# Patient Record
Sex: Female | Born: 1980 | Race: Black or African American | Hispanic: No | Marital: Single | Smoking: Never smoker
Health system: Southern US, Community
[De-identification: ages and names within clinical notes are randomized; demographics above are authoritative.]

## PROBLEM LIST (undated history)

## (undated) DIAGNOSIS — I10 Essential (primary) hypertension: Secondary | ICD-10-CM

## (undated) DIAGNOSIS — I2510 Atherosclerotic heart disease of native coronary artery without angina pectoris: Secondary | ICD-10-CM

## (undated) HISTORY — PX: TUBAL LIGATION: SHX77

## (undated) HISTORY — PX: CORONARY ANGIOPLASTY WITH STENT PLACEMENT: SHX49

---

## 2011-09-12 ENCOUNTER — Emergency Department (HOSPITAL_BASED_OUTPATIENT_CLINIC_OR_DEPARTMENT_OTHER): Payer: Self-pay

## 2011-09-12 ENCOUNTER — Encounter (HOSPITAL_BASED_OUTPATIENT_CLINIC_OR_DEPARTMENT_OTHER): Payer: Self-pay | Admitting: *Deleted

## 2011-09-12 ENCOUNTER — Emergency Department (HOSPITAL_BASED_OUTPATIENT_CLINIC_OR_DEPARTMENT_OTHER)
Admission: EM | Admit: 2011-09-12 | Discharge: 2011-09-12 | Disposition: A | Discharge status: Home / Self Care | Payer: Self-pay | Attending: Emergency Medicine | Admitting: Emergency Medicine

## 2011-09-12 DIAGNOSIS — R102 Pelvic and perineal pain: Secondary | ICD-10-CM

## 2011-09-12 DIAGNOSIS — R10819 Abdominal tenderness, unspecified site: Secondary | ICD-10-CM | POA: Insufficient documentation

## 2011-09-12 DIAGNOSIS — R1031 Right lower quadrant pain: Secondary | ICD-10-CM | POA: Insufficient documentation

## 2011-09-12 DIAGNOSIS — N949 Unspecified condition associated with female genital organs and menstrual cycle: Secondary | ICD-10-CM | POA: Insufficient documentation

## 2011-09-12 DIAGNOSIS — R300 Dysuria: Secondary | ICD-10-CM | POA: Insufficient documentation

## 2011-09-12 DIAGNOSIS — I10 Essential (primary) hypertension: Secondary | ICD-10-CM | POA: Insufficient documentation

## 2011-09-12 HISTORY — DX: Essential (primary) hypertension: I10

## 2011-09-12 LAB — CBC
HCT: 40 % (ref 36.0–46.0)
Hemoglobin: 13.7 g/dL (ref 12.0–15.0)
MCH: 29.1 pg (ref 26.0–34.0)
MCHC: 34.3 g/dL (ref 30.0–36.0)
MCV: 85.1 fL (ref 78.0–100.0)
Platelets: 264 10*3/uL (ref 150–400)
RBC: 4.7 MIL/uL (ref 3.87–5.11)
RDW: 13.1 % (ref 11.5–15.5)
WBC: 7.5 10*3/uL (ref 4.0–10.5)

## 2011-09-12 LAB — WET PREP, GENITAL
Trich, Wet Prep: NONE SEEN
Yeast Wet Prep HPF POC: NONE SEEN

## 2011-09-12 LAB — URINALYSIS, ROUTINE W REFLEX MICROSCOPIC
Bilirubin Urine: NEGATIVE
Glucose, UA: NEGATIVE mg/dL
Ketones, ur: NEGATIVE mg/dL
Leukocytes, UA: NEGATIVE
Nitrite: NEGATIVE
Protein, ur: 30 mg/dL — AB
Specific Gravity, Urine: 1.024 (ref 1.005–1.030)
Urobilinogen, UA: 1 mg/dL (ref 0.0–1.0)
pH: 7 (ref 5.0–8.0)

## 2011-09-12 LAB — BASIC METABOLIC PANEL
BUN: 9 mg/dL (ref 6–23)
CO2: 26 mEq/L (ref 19–32)
Calcium: 9.4 mg/dL (ref 8.4–10.5)
Chloride: 107 mEq/L (ref 96–112)
Creatinine, Ser: 1.4 mg/dL — ABNORMAL HIGH (ref 0.50–1.10)
GFR calc Af Amer: 56 mL/min — ABNORMAL LOW (ref >90)
GFR calc non Af Amer: 49 mL/min — ABNORMAL LOW (ref >90)
Glucose, Bld: 104 mg/dL — ABNORMAL HIGH (ref 70–99)
Potassium: 4.4 mEq/L (ref 3.5–5.1)
Sodium: 142 mEq/L (ref 135–145)

## 2011-09-12 LAB — DIFFERENTIAL
Basophils Absolute: 0 10*3/uL (ref 0.0–0.1)
Basophils Relative: 0 % (ref 0–1)
Eosinophils Absolute: 0.1 10*3/uL (ref 0.0–0.7)
Eosinophils Relative: 1 % (ref 0–5)
Lymphocytes Relative: 43 % (ref 12–46)
Lymphs Abs: 3.3 10*3/uL (ref 0.7–4.0)
Monocytes Absolute: 0.7 10*3/uL (ref 0.1–1.0)
Monocytes Relative: 10 % (ref 3–12)
Neutro Abs: 3.5 10*3/uL (ref 1.7–7.7)
Neutrophils Relative %: 46 % (ref 43–77)

## 2011-09-12 LAB — URINE MICROSCOPIC-ADD ON

## 2011-09-12 LAB — PREGNANCY, URINE: Preg Test, Ur: NEGATIVE

## 2011-09-12 MED ORDER — IBUPROFEN 800 MG PO TABS
800.0000 mg | ORAL_TABLET | Freq: Once | ORAL | Status: AC
  Administered 2011-09-12: 800 mg via ORAL
  Filled 2011-09-12: qty 1

## 2011-09-12 MED ORDER — PHENAZOPYRIDINE HCL 200 MG PO TABS: 200.0000 mg | ORAL_TABLET | Freq: Three times a day (TID) | ORAL | Status: AC

## 2011-09-12 MED ORDER — ONDANSETRON 8 MG PO TBDP: 8.0000 mg | ORAL_TABLET | Freq: Three times a day (TID) | ORAL | Status: AC | PRN

## 2011-09-12 MED ORDER — OXYCODONE-ACETAMINOPHEN 5-325 MG PO TABS
1.0000 | ORAL_TABLET | Freq: Once | ORAL | Status: AC
  Administered 2011-09-12: 1 via ORAL
  Filled 2011-09-12 (×2): qty 1

## 2011-09-12 MED ORDER — SODIUM CHLORIDE 0.9 % IV SOLN: Freq: Once | INTRAVENOUS | Status: DC

## 2011-09-12 MED ORDER — IBUPROFEN 800 MG PO TABS: 800.0000 mg | ORAL_TABLET | Freq: Three times a day (TID) | ORAL | Status: AC | PRN

## 2011-09-12 MED ORDER — SULFAMETHOXAZOLE-TRIMETHOPRIM 800-160 MG PO TABS: 1.0000 | ORAL_TABLET | Freq: Two times a day (BID) | ORAL | Status: AC

## 2011-09-12 MED ORDER — ONDANSETRON 8 MG PO TBDP
8.0000 mg | ORAL_TABLET | Freq: Once | ORAL | Status: AC
Start: 2011-09-12 — End: 2011-09-12
  Administered 2011-09-12: 8 mg via ORAL
  Filled 2011-09-12: qty 1

## 2011-09-12 MED ORDER — DIPHENHYDRAMINE HCL 50 MG/ML IJ SOLN: 12.5000 mg | Freq: Once | INTRAMUSCULAR | Status: DC

## 2011-09-12 MED ORDER — PROCHLORPERAZINE EDISYLATE 5 MG/ML IJ SOLN
10.0000 mg | Freq: Four times a day (QID) | INTRAMUSCULAR | Status: DC | PRN
  Filled 2011-09-12: qty 2

## 2011-09-12 MED ORDER — HYDROCODONE-ACETAMINOPHEN 5-325 MG PO TABS: 1.0000 | ORAL_TABLET | ORAL | Status: AC | PRN

## 2011-09-12 NOTE — ED Provider Notes (Signed)
History     CSN: [id number]  Arrival date & time 09/12/11  1718   First MD Initiated Contact with Patient 09/12/11 1750      Chief Complaint  Patient presents with  . Urinary Tract Infection    (Consider location/radiation/quality/duration/timing/severity/associated sxs/prior treatment) HPI Comments: Patient presents today with a past medical history of UTIs with the chief complaint of hematuria and abdominal pain. She states the abdominal pain began yesterday along with dysuria and urinary frequency. This morning she noticed blood in her urine. She rates the pain 8/10 and has taken Tylenol which has not provided relief. She denies fever, nausea, shortness of breath, chest pain, diaphoresis and vaginal discharge. She vomited once last night. She did not take her Norvasc this morning and has a history of hypertension. She has no change in appetite. She has a medical history of chlamydia and trichomonas and was treated for both without any complications.  Patient is a 31 y.o. female presenting with urinary tract infection. The history is provided by the patient.  Urinary Tract Infection This is a new problem. The current episode started yesterday. The problem occurs constantly. The problem has been unchanged. Associated symptoms include abdominal pain, urinary symptoms and vomiting. Pertinent negatives include no change in bowel habit, chest pain, chills, fever or nausea. She has tried acetaminophen for the symptoms. The treatment provided no relief.    Past Medical History  Diagnosis Date  . Hypertension     Past Surgical History  Procedure Date  . Cesarean section   . Tubal ligation     No family history on file.  History  Substance Use Topics  . Smoking status: Never Smoker   . Smokeless tobacco: Not on file  . Alcohol Use: No    OB History    Grav Para Term Preterm Abortions TAB SAB Ect Mult Living                  Review of Systems  Constitutional: Negative for  fever, chills and appetite change.  Respiratory: Negative for shortness of breath.   Cardiovascular: Negative for chest pain.  Gastrointestinal: Positive for vomiting and abdominal pain. Negative for nausea, diarrhea and change in bowel habit.  Genitourinary: Positive for dysuria, frequency and hematuria. Negative for flank pain, vaginal bleeding, vaginal discharge, difficulty urinating, vaginal pain and menstrual problem.       Suprapubic pain    Allergies  Review of patient's allergies indicates no known allergies.  Home Medications   Current Outpatient Rx  Name Route Sig Dispense Refill  . ACETAMINOPHEN 500 MG PO TABS Oral Take 1,000 mg by mouth 2 (two) times daily as needed. For pain    . AMLODIPINE BESYLATE 10 MG PO TABS Oral Take 10 mg by mouth daily.      BP 159/110  Pulse 96  Temp 98.4 F (36.9 C) (Oral)  Resp 20  SpO2 100%  Physical Exam  Nursing note and vitals reviewed. Constitutional: She appears well-developed and well-nourished.       In no acute distress  Cardiovascular: Normal rate, regular rhythm and normal heart sounds.   Pulmonary/Chest: Effort normal and breath sounds normal.  Abdominal: Soft. Bowel sounds are normal. There is tenderness.       Suprapubic tenderness to palpation. No CVA tenderness with percussion  Genitourinary: No vaginal discharge found.       GU exam: Cervix is pink with blood. No cervical or vaginal discharge. Generalized tenderness and pain with speculum and pelvic  exam    ED Course  Procedures (including critical care time)   Labs Reviewed  URINALYSIS, ROUTINE W REFLEX MICROSCOPIC   No results found. Results for orders placed during the hospital encounter of 09/12/11  URINALYSIS, ROUTINE W REFLEX MICROSCOPIC      Component Value Range   Color, Urine AMBER (*) YELLOW   APPearance CLOUDY (*) CLEAR   Specific Gravity, Urine 1.024  1.005 - 1.030   pH 7.0  5.0 - 8.0   Glucose, UA NEGATIVE  NEGATIVE mg/dL   Hgb urine dipstick  LARGE (*) NEGATIVE   Bilirubin Urine NEGATIVE  NEGATIVE   Ketones, ur NEGATIVE  NEGATIVE mg/dL   Protein, ur 30 (*) NEGATIVE mg/dL   Urobilinogen, UA 1.0  0.0 - 1.0 mg/dL   Nitrite NEGATIVE  NEGATIVE   Leukocytes, UA NEGATIVE  NEGATIVE  URINE MICROSCOPIC-ADD ON      Component Value Range   Squamous Epithelial / LPF FEW (*) RARE   RBC / HPF 7-10  <3 RBC/hpf   Bacteria, UA RARE  RARE  BASIC METABOLIC PANEL      Component Value Range   Sodium 142  135 - 145 mEq/L   Potassium 4.4  3.5 - 5.1 mEq/L   Chloride 107  96 - 112 mEq/L   CO2 26  19 - 32 mEq/L   Glucose, Bld 104 (*) 70 - 99 mg/dL   BUN 9  6 - 23 mg/dL   Creatinine, Ser 1.61 (*) 0.50 - 1.10 mg/dL   Calcium 9.4  8.4 - 09.6 mg/dL   GFR calc non Af Amer 49 (*) >90 mL/min   GFR calc Af Amer 56 (*) >90 mL/min  PREGNANCY, URINE      Component Value Range   Preg Test, Ur NEGATIVE  NEGATIVE  CBC      Component Value Range   WBC 7.5  4.0 - 10.5 K/uL   RBC 4.70  3.87 - 5.11 MIL/uL   Hemoglobin 13.7  12.0 - 15.0 g/dL   HCT 04.5  40.9 - 81.1 %   MCV 85.1  78.0 - 100.0 fL   MCH 29.1  26.0 - 34.0 pg   MCHC 34.3  30.0 - 36.0 g/dL   RDW 91.4  78.2 - 95.6 %   Platelets 264  150 - 400 K/uL  DIFFERENTIAL      Component Value Range   Neutrophils Relative 46  43 - 77 %   Neutro Abs 3.5  1.7 - 7.7 K/uL   Lymphocytes Relative 43  12 - 46 %   Lymphs Abs 3.3  0.7 - 4.0 K/uL   Monocytes Relative 10  3 - 12 %   Monocytes Absolute 0.7  0.1 - 1.0 K/uL   Eosinophils Relative 1  0 - 5 %   Eosinophils Absolute 0.1  0.0 - 0.7 K/uL   Basophils Relative 0  0 - 1 %   Basophils Absolute 0.0  0.0 - 0.1 K/uL  WET PREP, GENITAL      Component Value Range   Yeast Wet Prep HPF POC NONE SEEN  NONE SEEN   Trich, Wet Prep NONE SEEN  NONE SEEN   Clue Cells Wet Prep HPF POC MODERATE (*) NONE SEEN   WBC, Wet Prep HPF POC RARE (*) NONE SEEN   BP 159/110  Pulse 96  Temp 98.4 F (36.9 C) (Oral)  Resp 20  SpO2 100%   No diagnosis found. 1. Pelvic  pain 2. Dysuria 3. Uterine fibroid  MDM  Ultrasound ordered to evaluate disproportionate pain on bimanual exam.   US Transvaginal Non-ob  09/12/2011  *RADIOLOGY REPORT*  Clinical Data: Right lower quadrant abdominal pain with hematuria for 2 days.  History of recurrent urinary tract infection, C section and tubal ligation.  LMP unknown.  TRANSABDOMINAL AND TRANSVAGINAL ULTRASOUND OF PELVIS Technique:  Both transabdominal and transvaginal ultrasound examinations of the pelvis were performed. Transabdominal technique was performed for global imaging of the pelvis including uterus, ovaries, adnexal regions, and pelvic cul-de-sac.  It was necessary to proceed with endovaginal exam following the transabdominal exam to visualize the endometrium and ovaries to better advantage.  Comparison:  None  Findings:  Uterus: 8.0 x 3.9 x 4.1 cm.  There is a mildly exophytic anterior fundal fibroid on the right measuring 2.3 x 1.4 x 2.2 cm.  Endometrium: Homogeneous in appearance measuring 6.5 mm in thickness.  Right ovary:  Normal in appearance measuring 2.5 x 1.9 x 2.0 cm.  Left ovary: Normal appearance measuring 2.6 x 1.8 x 1.8 cm.  Other findings: No free fluid  IMPRESSION: No acute pelvic findings.  Small fundal fibroid.   Original Report Authenticated By: Gerrianne Scale, M.D.    US Pelvis Complete  09/12/2011  *RADIOLOGY REPORT*  Clinical Data: Right lower quadrant abdominal pain with hematuria for 2 days.  History of recurrent urinary tract infection, C section and tubal ligation.  LMP unknown.  TRANSABDOMINAL AND TRANSVAGINAL ULTRASOUND OF PELVIS Technique:  Both transabdominal and transvaginal ultrasound examinations of the pelvis were performed. Transabdominal technique was performed for global imaging of the pelvis including uterus, ovaries, adnexal regions, and pelvic cul-de-sac.  It was necessary to proceed with endovaginal exam following the transabdominal exam to visualize the endometrium and ovaries to  better advantage.  Comparison:  None  Findings:  Uterus: 8.0 x 3.9 x 4.1 cm.  There is a mildly exophytic anterior fundal fibroid on the right measuring 2.3 x 1.4 x 2.2 cm.  Endometrium: Homogeneous in appearance measuring 6.5 mm in thickness.  Right ovary:  Normal in appearance measuring 2.5 x 1.9 x 2.0 cm.  Left ovary: Normal appearance measuring 2.6 x 1.8 x 1.8 cm.  Other findings: No free fluid  IMPRESSION: No acute pelvic findings.  Small fundal fibroid.   Original Report Authenticated By: Gerrianne Scale, M.D.    Pain has improved with Percocet. Ultrasound ruled out torsion, PID and abscess. Small amount of cervical bleeding may be due to fibroid. VSS. Patient being treated for urinary symptoms with antibiotic and pyridium. Patient will have close follow-up with OBGYN physician.      Rodena Medin, PA-C 09/14/11 0110

## 2011-09-12 NOTE — ED Notes (Signed)
Another CBC will be done on Room [address] due to label.

## 2011-09-12 NOTE — ED Notes (Signed)
Pt. Is in no distress with reports of pain in the lower R quadrant.  No nausea or vomiting noted.

## 2011-09-12 NOTE — ED Notes (Signed)
Hematuria since yesterday. Hx of UTI's

## 2011-09-13 LAB — GC/CHLAMYDIA PROBE AMP, GENITAL
Chlamydia, DNA Probe: NEGATIVE
GC Probe Amp, Genital: NEGATIVE

## 2011-09-14 LAB — URINE CULTURE: Colony Count: 30000

## 2011-09-14 NOTE — ED Provider Notes (Signed)
Medical screening examination/treatment/procedure(s) were performed by non-physician practitioner and as supervising physician I was immediately available for consultation/collaboration.  Jones Skene, M.D.     Jones Skene, MD 09/14/11 1242

## 2012-07-22 ENCOUNTER — Encounter (HOSPITAL_BASED_OUTPATIENT_CLINIC_OR_DEPARTMENT_OTHER): Payer: Self-pay | Admitting: *Deleted

## 2012-07-22 ENCOUNTER — Emergency Department (HOSPITAL_BASED_OUTPATIENT_CLINIC_OR_DEPARTMENT_OTHER)
Admission: EM | Admit: 2012-07-22 | Discharge: 2012-07-22 | Disposition: A | Discharge status: Home Health | Payer: Self-pay | Attending: Emergency Medicine | Admitting: Emergency Medicine

## 2012-07-22 DIAGNOSIS — I10 Essential (primary) hypertension: Secondary | ICD-10-CM | POA: Insufficient documentation

## 2012-07-22 DIAGNOSIS — R51 Headache: Secondary | ICD-10-CM | POA: Insufficient documentation

## 2012-07-22 DIAGNOSIS — R519 Headache, unspecified: Secondary | ICD-10-CM

## 2012-07-22 DIAGNOSIS — Z3202 Encounter for pregnancy test, result negative: Secondary | ICD-10-CM | POA: Insufficient documentation

## 2012-07-22 LAB — URINALYSIS, ROUTINE W REFLEX MICROSCOPIC
Bilirubin Urine: NEGATIVE
Glucose, UA: NEGATIVE mg/dL
Hgb urine dipstick: NEGATIVE
Ketones, ur: NEGATIVE mg/dL
Leukocytes, UA: NEGATIVE
Nitrite: NEGATIVE
Protein, ur: 100 mg/dL — AB
Specific Gravity, Urine: 1.011 (ref 1.005–1.030)
Urobilinogen, UA: 1 mg/dL (ref 0.0–1.0)
pH: 7.5 (ref 5.0–8.0)

## 2012-07-22 LAB — PREGNANCY, URINE: Preg Test, Ur: NEGATIVE

## 2012-07-22 LAB — URINE MICROSCOPIC-ADD ON

## 2012-07-22 MED ORDER — HYDROCHLOROTHIAZIDE 25 MG PO TABS: 25.0000 mg | ORAL_TABLET | Freq: Every day | ORAL | Status: DC

## 2012-07-22 MED ORDER — KETOROLAC TROMETHAMINE 30 MG/ML IJ SOLN
30.0000 mg | Freq: Once | INTRAMUSCULAR | Status: AC
  Administered 2012-07-22: 30 mg via INTRAVENOUS
  Filled 2012-07-22: qty 1

## 2012-07-22 MED ORDER — DIPHENHYDRAMINE HCL 50 MG/ML IJ SOLN
25.0000 mg | Freq: Once | INTRAMUSCULAR | Status: AC
  Administered 2012-07-22: 25 mg via INTRAVENOUS
  Filled 2012-07-22: qty 1

## 2012-07-22 MED ORDER — SODIUM CHLORIDE 0.9 % IV SOLN
Freq: Once | INTRAVENOUS | Status: AC
  Administered 2012-07-22: 18:00:00 via INTRAVENOUS

## 2012-07-22 MED ORDER — HYDROCHLOROTHIAZIDE 25 MG PO TABS
25.0000 mg | ORAL_TABLET | Freq: Once | ORAL | Status: AC
  Administered 2012-07-22: 25 mg via ORAL
  Filled 2012-07-22: qty 1

## 2012-07-22 MED ORDER — METOCLOPRAMIDE HCL 5 MG/ML IJ SOLN
10.0000 mg | Freq: Once | INTRAMUSCULAR | Status: AC
  Administered 2012-07-22: 10 mg via INTRAVENOUS
  Filled 2012-07-22: qty 2

## 2012-07-22 NOTE — ED Notes (Signed)
Pt reports migraine and high blood pressure. Pt states she just left [facility] states they told her blood pressure was 200/104 but she states there were too many people in front of her to stay and wait

## 2012-07-22 NOTE — ED Provider Notes (Signed)
Medical screening examination/treatment/procedure(s) were performed by non-physician practitioner and as supervising physician I was immediately available for consultation/collaboration.   Gwyneth Sprout, MD 07/22/12 2009

## 2012-07-22 NOTE — ED Provider Notes (Signed)
   History    CSN: [id number] Arrival date & time 07/22/12  1601  First MD Initiated Contact with Patient 07/22/12 1633     Chief Complaint  Patient presents with  . Migraine  . Hypertension   (Consider location/radiation/quality/duration/timing/severity/associated sxs/prior Treatment) Patient is a 32 y.o. female presenting with hypertension. The history is provided by the patient. No language interpreter was used.  Hypertension This is a new problem. The current episode started today. The problem occurs constantly. The problem has been gradually worsening. Associated symptoms include headaches. Nothing aggravates the symptoms. She has tried nothing for the symptoms. The treatment provided moderate relief.  Pt complains of high blood pressure.  Pt reports she also has a headache.   Bp was higher at [place] [facility] Past Medical History  Diagnosis Date  . Hypertension    Past Surgical History  Procedure Laterality Date  . Cesarean section    . Tubal ligation     History reviewed. No pertinent family history. History  Substance Use Topics  . Smoking status: Never Smoker   . Smokeless tobacco: Not on file  . Alcohol Use: No   OB History   Grav Para Term Preterm Abortions TAB SAB Ect Mult Living                 Review of Systems  Neurological: Positive for headaches.  All other systems reviewed and are negative.    Allergies  Review of patient's allergies indicates no known allergies.  Home Medications   Current Outpatient Rx  Name  Route  Sig  Dispense  Refill  . acetaminophen (TYLENOL) 500 MG tablet   Oral   Take 1,000 mg by mouth 2 (two) times daily as needed. For pain         . amLODipine (NORVASC) 10 MG tablet   Oral   Take 10 mg by mouth daily.          BP 170/121  Pulse 80  Temp(Src) 97.8 F (36.6 C) (Oral)  Resp 18  Ht 5\' 1"  (1.549 m)  Wt 210 lb (95.255 kg)  BMI 39.7 kg/m2  SpO2 99% Physical Exam  Nursing note and vitals  reviewed. Constitutional: She is oriented to person, place, and time. She appears well-developed and well-nourished.  HENT:  Head: Normocephalic.  Right Ear: External ear normal.  Left Ear: External ear normal.  Eyes: Pupils are equal, round, and reactive to light.  Neck: Normal range of motion.  Cardiovascular: Normal rate.   Pulmonary/Chest: Effort normal.  Abdominal: Soft.  Musculoskeletal: Normal range of motion.  Neurological: She is alert and oriented to person, place, and time. She has normal reflexes.  Skin: Skin is warm.  Psychiatric: She has a normal mood and affect.    ED Course  Procedures (including critical care time) Labs Reviewed  URINALYSIS, ROUTINE W REFLEX MICROSCOPIC  PREGNANCY, URINE   No results found. 1. Hypertension   2. Headache     MDM  Blood pressure decreased,   Headache resolved,   Pt reports she feels much better.   Rx for hctz.   See your PA for recheck this week  Elson Areas, [place] 07/22/12 4098

## 2012-08-20 ENCOUNTER — Emergency Department (HOSPITAL_COMMUNITY): Payer: Self-pay

## 2012-08-20 ENCOUNTER — Encounter (HOSPITAL_COMMUNITY): Payer: Self-pay | Admitting: Emergency Medicine

## 2012-08-20 ENCOUNTER — Emergency Department (HOSPITAL_COMMUNITY)
Admission: EM | Admit: 2012-08-20 | Discharge: 2012-08-20 | Disposition: A | Discharge status: Home / Self Care | Payer: Self-pay | Attending: Emergency Medicine | Admitting: Emergency Medicine

## 2012-08-20 DIAGNOSIS — I10 Essential (primary) hypertension: Secondary | ICD-10-CM | POA: Insufficient documentation

## 2012-08-20 DIAGNOSIS — Z79899 Other long term (current) drug therapy: Secondary | ICD-10-CM | POA: Insufficient documentation

## 2012-08-20 DIAGNOSIS — R109 Unspecified abdominal pain: Secondary | ICD-10-CM | POA: Insufficient documentation

## 2012-08-20 DIAGNOSIS — Z3202 Encounter for pregnancy test, result negative: Secondary | ICD-10-CM | POA: Insufficient documentation

## 2012-08-20 LAB — CBC WITH DIFFERENTIAL/PLATELET
Basophils Absolute: 0 10*3/uL (ref 0.0–0.1)
Basophils Relative: 0 % (ref 0–1)
Eosinophils Absolute: 0.1 10*3/uL (ref 0.0–0.7)
Eosinophils Relative: 1 % (ref 0–5)
HCT: 37 % (ref 36.0–46.0)
Hemoglobin: 13.2 g/dL (ref 12.0–15.0)
Lymphocytes Relative: 52 % — ABNORMAL HIGH (ref 12–46)
Lymphs Abs: 3.2 10*3/uL (ref 0.7–4.0)
MCH: 30.1 pg (ref 26.0–34.0)
MCHC: 35.7 g/dL (ref 30.0–36.0)
MCV: 84.5 fL (ref 78.0–100.0)
Monocytes Absolute: 0.5 10*3/uL (ref 0.1–1.0)
Monocytes Relative: 8 % (ref 3–12)
Neutro Abs: 2.4 10*3/uL (ref 1.7–7.7)
Neutrophils Relative %: 39 % — ABNORMAL LOW (ref 43–77)
Platelets: 259 10*3/uL (ref 150–400)
RBC: 4.38 MIL/uL (ref 3.87–5.11)
RDW: 13.1 % (ref 11.5–15.5)
WBC: 6.2 10*3/uL (ref 4.0–10.5)

## 2012-08-20 LAB — URINALYSIS, ROUTINE W REFLEX MICROSCOPIC
Glucose, UA: NEGATIVE mg/dL
Hgb urine dipstick: NEGATIVE
Ketones, ur: NEGATIVE mg/dL
Leukocytes, UA: NEGATIVE
Nitrite: NEGATIVE
Protein, ur: 30 mg/dL — AB
Specific Gravity, Urine: 1.03 (ref 1.005–1.030)
Urobilinogen, UA: 1 mg/dL (ref 0.0–1.0)
pH: 6 (ref 5.0–8.0)

## 2012-08-20 LAB — BASIC METABOLIC PANEL
BUN: 10 mg/dL (ref 6–23)
CO2: 22 mEq/L (ref 19–32)
Calcium: 9.1 mg/dL (ref 8.4–10.5)
Chloride: 105 mEq/L (ref 96–112)
Creatinine, Ser: 1.19 mg/dL — ABNORMAL HIGH (ref 0.50–1.10)
GFR calc Af Amer: 68 mL/min — ABNORMAL LOW (ref >90)
GFR calc non Af Amer: 59 mL/min — ABNORMAL LOW (ref >90)
Glucose, Bld: 73 mg/dL (ref 70–99)
Potassium: 4.6 mEq/L (ref 3.5–5.1)
Sodium: 138 mEq/L (ref 135–145)

## 2012-08-20 LAB — URINE MICROSCOPIC-ADD ON

## 2012-08-20 LAB — PREGNANCY, URINE: Preg Test, Ur: NEGATIVE

## 2012-08-20 MED ORDER — HYDROCODONE-ACETAMINOPHEN 5-325 MG PO TABS: 2.0000 | ORAL_TABLET | ORAL | Status: DC | PRN

## 2012-08-20 MED ORDER — IOHEXOL 300 MG/ML  SOLN
100.0000 mL | Freq: Once | INTRAMUSCULAR | Status: AC | PRN
  Administered 2012-08-20: 100 mL via INTRAVENOUS

## 2012-08-20 NOTE — ED Notes (Signed)
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 Seen at [facility] stated abdominal strain seen again today at [facility] sent to ED for further evaluation.  Pain currently 8/10 achy sharp. Denies nausea/emesis/ or diarrhea.

## 2012-08-20 NOTE — ED Provider Notes (Signed)
CSN: [id number]     Arrival date & time 08/20/12  1536 History     First MD Initiated Contact with Patient 08/20/12 1721     Chief Complaint  Patient presents with  . Abdominal Pain   (Consider location/radiation/quality/duration/timing/severity/associated sxs/prior Treatment) HPI 32 year old female presents to the emergency department chief complaint of right lower quadrant abdominal pain.  She's been seen twice [facility] for this problem and was given a diagnosis of abdominal wall strain.  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  She complains of pain in the right lower quadrant which is worse with walking twisting and movement.  She says it is severe with walking.  It is relieved by rest and lying down.  She is taken ibuprofen 800 mg without relief of her symptoms.  Pain is 8 out of 10, sharp, achy does not radiate.  The patient denies abdominal symptoms such as nausea, vomiting, diarrhea, hematochezia or melena.  Patient denies any urinary or vaginal symptoms.  She is history of cesarean section  , tubal ligation and no other abdominal surgeries. Denies fevers, chills, myalgias, arthralgias. Denies DOE, SOB, chest tightness or pressure, radiation to left arm, jaw or back, or diaphoresis. Denies headaches, light headedness, weakness, visual disturbances.  Past Medical History  Diagnosis Date  . Hypertension    Past Surgical History  Procedure Laterality Date  . Cesarean section    . Tubal ligation     No family history on file. History  Substance Use Topics  . Smoking status: Never Smoker   . Smokeless tobacco: Not on file  . Alcohol Use: No   OB History   Grav Para Term Preterm Abortions TAB SAB Ect Mult Living                 Review of Systems Ten systems reviewed and are negative for acute change, except as noted in the HPI.   Allergies  Review of patient's allergies indicates no known  allergies.  Home Medications   Current Outpatient Rx  Name  Route  Sig  Dispense  Refill  . acetaminophen (TYLENOL) 500 MG tablet   Oral   Take 500 mg by mouth 2 (two) times daily as needed for pain. For pain         . amLODipine (NORVASC) 10 MG tablet   Oral   Take 10 mg by mouth daily.         . hydrochlorothiazide (HYDRODIURIL) 25 MG tablet   Oral   Take 25 mg by mouth daily.         Marland Kitchen ibuprofen (ADVIL,MOTRIN) 800 MG tablet   Oral   Take 800 mg by mouth every 8 (eight) hours as needed for pain.          BP 146/102  Pulse 73  Temp(Src) 99.1 F (37.3 C) (Oral)  Resp 24  SpO2 99% Physical Exam Physical Exam  Nursing note and vitals reviewed. Constitutional: She is oriented to person, place, and time. She appears well-developed and well-nourished. No distress.  HENT:  Head: Normocephalic and atraumatic.  Eyes: Conjunctivae normal and EOM are normal. Pupils are equal, round, and reactive to light. No scleral icterus.  Neck: Normal range of motion.  Cardiovascular: Normal rate, regular rhythm and normal heart sounds.  Exam reveals no gallop and no friction rub.   No murmur heard. Pulmonary/Chest: Effort normal and breath sounds normal. No respiratory distress.  Abdominal: Soft. Bowel sounds are normal.  Tender to palpation of the abdominal cavity in the right lower quadrant and right upper quadrant.  No hernias or masses palpated.  Patient is able to flex the abdominal cavity activating the abdominal wall without pain.  She can flex the right hip activating the cell has without pain.  Neurological: She is alert and oriented to person, place, and time.  Skin: Skin is warm and dry. She is not diaphoretic.    ED Course   Procedures (including critical care time)   EMERGENCY DEPARTMENT BILIARY ULTRASOUND INTERPRETATION "Study: Limited Abdominal Ultrasound of the gallbladder and common bile duct."  INDICATIONS: right abd pain Indication: Multiple views of the  gallbladder and common bile duct were obtained in real-time with a Multi-frequency probe." PERFORMED BY:  myself IMAGES ARCHIVED?: yes FINDINGS: no stones or signs of cholecystits LIMITATIONS: pain, gas, body habitus INTERPRETATION: no acute findings on limited scan  Emergency Ultrasound Study:   Angiocath insertion Performed by: Enid Skeens  Consent: Verbal consent obtained. Risks and benefits: risks, benefits and alternatives were discussed Immediately prior to procedure the correct patient, procedure, equipment, support staff and site/side marked as needed.  Indication: difficult IV access Preparation: Patient was prepped and draped in the usual sterile fashion. Vein Location: right AC vein was visualized during assessment for potential access sites and was found to be patent/ easily compressed with linear ultrasound.  The needle was visualized with real-time ultrasound and guided into the vein. Gauge: 20 g  Image saved and stored.  Normal blood return.  Patient tolerance: Patient tolerated the procedure well with no immediate complications.          Labs Reviewed  CBC WITH DIFFERENTIAL - Abnormal; Notable for the following:    Neutrophils Relative % 39 (*)    Lymphocytes Relative 52 (*)    All other components within normal limits  BASIC METABOLIC PANEL - Abnormal; Notable for the following:    Creatinine, Ser 1.19 (*)    GFR calc non Af Amer 59 (*)    GFR calc Af Amer 68 (*)    All other components within normal limits  URINALYSIS, ROUTINE W REFLEX MICROSCOPIC - Abnormal; Notable for the following:    APPearance HAZY (*)    Bilirubin Urine SMALL (*)    Protein, ur 30 (*)    All other components within normal limits  URINE MICROSCOPIC-ADD ON - Abnormal; Notable for the following:    Squamous Epithelial / LPF FEW (*)    Bacteria, UA FEW (*)    All other components within normal limits  PREGNANCY, URINE   No results found. No diagnosis found.  MDM    Filed Vitals:   08/20/12 1730 08/20/12 1743 08/20/12 1815 08/20/12 1938  BP: 169/108 169/108 149/113 146/102  Pulse: 81 75 73 73  Temp:    99.1 F (37.3 C)  TempSrc:    Oral  Resp:  16  24  SpO2: 100% 100% 100% 99%   With hypertension and mildly elevated temperature which is afebrile.  Vital signs are otherwise normal.  He is a history of hypertension.  Patient exam is concerning for intra-abdominal pathology as the source of her complaint considering negative muscular testing. I've seen the patient in shared visit with Dr. Jodi Mourning who agrees that patient should progress a CT scan of the abdomen.  8:36 PM BP 146/102  Pulse 73  Temp(Src) 99.1 F (37.3 C) (Oral)  Resp 24  SpO2 99% Patient with  BL nephrolithiasis. No evidence of hydronephrosis. i have discussed evidence of kidney dysfunction, need for follow up and control of BP. Patient did not take her amlodipine today Discontinue nsaids.   Arthor Captain, PA-C 08/20/12 2051  Medical screening examination/treatment/procedure(s) were conducted as a shared visit with non-physician practitioner(s) or resident  and myself.  I personally evaluated the patient during the encounter and agree with the findings and plan unless otherwise indicated.    Recurrent right abdominal pain since lifting incident. Right mid abd and right flank pain. Likely MSK however pain both flank and near appendix.  Normal wbc, no fever, pt tolerating po.  Low risk appy.  IV guided IV due to difficult IV.  IV infiltrated. Pt refuses another IV, she understands may limit CT scan.  CT abd pelv without contrast no acute findings.   CLose fup outpt.    Enid Skeens, MD 08/22/12 2209

## 2012-10-23 ENCOUNTER — Emergency Department (HOSPITAL_BASED_OUTPATIENT_CLINIC_OR_DEPARTMENT_OTHER)
Admission: EM | Admit: 2012-10-23 | Discharge: 2012-10-23 | Disposition: A | Discharge status: Home / Self Care | Payer: Self-pay | Attending: Emergency Medicine | Admitting: Emergency Medicine

## 2012-10-23 ENCOUNTER — Encounter (HOSPITAL_BASED_OUTPATIENT_CLINIC_OR_DEPARTMENT_OTHER): Payer: Self-pay | Admitting: Emergency Medicine

## 2012-10-23 DIAGNOSIS — N390 Urinary tract infection, site not specified: Secondary | ICD-10-CM | POA: Insufficient documentation

## 2012-10-23 DIAGNOSIS — Z79899 Other long term (current) drug therapy: Secondary | ICD-10-CM | POA: Insufficient documentation

## 2012-10-23 DIAGNOSIS — I10 Essential (primary) hypertension: Secondary | ICD-10-CM | POA: Insufficient documentation

## 2012-10-23 DIAGNOSIS — A599 Trichomoniasis, unspecified: Secondary | ICD-10-CM | POA: Insufficient documentation

## 2012-10-23 DIAGNOSIS — Z3202 Encounter for pregnancy test, result negative: Secondary | ICD-10-CM | POA: Insufficient documentation

## 2012-10-23 LAB — URINE MICROSCOPIC-ADD ON

## 2012-10-23 LAB — URINALYSIS, ROUTINE W REFLEX MICROSCOPIC
Bilirubin Urine: NEGATIVE
Glucose, UA: NEGATIVE mg/dL
Hgb urine dipstick: NEGATIVE
Ketones, ur: NEGATIVE mg/dL
Nitrite: NEGATIVE
Protein, ur: NEGATIVE mg/dL
Specific Gravity, Urine: 1.019 (ref 1.005–1.030)
Urobilinogen, UA: 0.2 mg/dL (ref 0.0–1.0)
pH: 6 (ref 5.0–8.0)

## 2012-10-23 LAB — WET PREP, GENITAL: Yeast Wet Prep HPF POC: NONE SEEN

## 2012-10-23 LAB — PREGNANCY, URINE: Preg Test, Ur: NEGATIVE

## 2012-10-23 MED ORDER — CEFTRIAXONE SODIUM 250 MG IJ SOLR
250.0000 mg | Freq: Once | INTRAMUSCULAR | Status: AC
  Administered 2012-10-23: 250 mg via INTRAMUSCULAR
  Filled 2012-10-23: qty 250

## 2012-10-23 MED ORDER — AZITHROMYCIN 250 MG PO TABS
1000.0000 mg | ORAL_TABLET | Freq: Once | ORAL | Status: AC
  Administered 2012-10-23: 1000 mg via ORAL
  Filled 2012-10-23: qty 4

## 2012-10-23 MED ORDER — CEPHALEXIN 500 MG PO CAPS: 500.0000 mg | ORAL_CAPSULE | Freq: Four times a day (QID) | ORAL | Status: DC

## 2012-10-23 MED ORDER — METRONIDAZOLE 500 MG PO TABS
2000.0000 mg | ORAL_TABLET | Freq: Once | ORAL | Status: AC
  Administered 2012-10-23: 2000 mg via ORAL
  Filled 2012-10-23: qty 4

## 2012-10-23 NOTE — ED Notes (Signed)
2 days of burning with urination and vaginal discharge and itching

## 2012-10-23 NOTE — ED Notes (Signed)
Pelvic cart at bedside. 

## 2012-10-23 NOTE — ED Provider Notes (Signed)
CSN: [id number]     Arrival date & time 10/23/12  1122 History   First MD Initiated Contact with Patient 10/23/12 1208     Chief Complaint  Patient presents with  . Vaginal Discharge   (Consider location/radiation/quality/duration/timing/severity/associated sxs/prior Treatment) HPI Comments: Patient is a 32 year old female with a past medical history of hypertension who presents with a 3 day history of vaginal discharged and associated dysuria. Symptoms started gradually and progressively worsened since the onset. Patient reports she has had the same sexual partner for 7 years, her boyfriend, and she recently received a phone call from someone who he had been "messing around with." Patient wants to be safe and have an infection treated if she has one. No aggravating/alleviating factors. No other associated symptoms.    Past Medical History  Diagnosis Date  . Hypertension    Past Surgical History  Procedure Laterality Date  . Cesarean section    . Tubal ligation     History reviewed. No pertinent family history. History  Substance Use Topics  . Smoking status: Never Smoker   . Smokeless tobacco: Not on file  . Alcohol Use: No   OB History   Grav Para Term Preterm Abortions TAB SAB Ect Mult Living                 Review of Systems  Genitourinary: Positive for dysuria and vaginal discharge.  All other systems reviewed and are negative.    Allergies  Review of patient's allergies indicates no known allergies.  Home Medications   Current Outpatient Rx  Name  Route  Sig  Dispense  Refill  . acetaminophen (TYLENOL) 500 MG tablet   Oral   Take 500 mg by mouth 2 (two) times daily as needed for pain. For pain         . amLODipine (NORVASC) 10 MG tablet   Oral   Take 10 mg by mouth daily.         . hydrochlorothiazide (HYDRODIURIL) 25 MG tablet   Oral   Take 25 mg by mouth daily.         Marland Kitchen HYDROcodone-acetaminophen (NORCO/VICODIN) 5-325 MG per tablet   Oral    Take 2 tablets by mouth every 4 (four) hours as needed for pain.   6 tablet   0   . ibuprofen (ADVIL,MOTRIN) 800 MG tablet   Oral   Take 800 mg by mouth every 8 (eight) hours as needed for pain.          BP 164/120  Pulse 91  Temp(Src) 97.6 F (36.4 C) (Oral)  SpO2 100% Physical Exam  Nursing note and vitals reviewed. Constitutional: She appears well-developed and well-nourished. No distress.  HENT:  Head: Normocephalic and atraumatic.  Eyes: Conjunctivae are normal.  Neck: Normal range of motion.  Cardiovascular: Normal rate and regular rhythm.  Exam reveals no gallop and no friction rub.   No murmur heard. Pulmonary/Chest: Effort normal and breath sounds normal. She has no wheezes. She has no rales. She exhibits no tenderness.  Abdominal: Soft. She exhibits no distension. There is no tenderness. There is no rebound and no guarding.  Genitourinary: Vagina normal.  Moderate amount of white discharge in vagina. Cervical os closed. No CMT.   Musculoskeletal: Normal range of motion.  Neurological: She is alert.  Speech is goal-oriented. Moves limbs without ataxia.   Skin: Skin is warm and dry.  Psychiatric: She has a normal mood and affect. Her behavior is normal.  ED Course  Procedures (including critical care time) Labs Review Labs Reviewed  WET PREP, GENITAL - Abnormal; Notable for the following:    Trich, Wet Prep MODERATE (*)    Clue Cells Wet Prep HPF POC MODERATE (*)    WBC, Wet Prep HPF POC MANY (*)    All other components within normal limits  URINALYSIS, ROUTINE W REFLEX MICROSCOPIC - Abnormal; Notable for the following:    APPearance CLOUDY (*)    Leukocytes, UA MODERATE (*)    All other components within normal limits  URINE MICROSCOPIC-ADD ON - Abnormal; Notable for the following:    Squamous Epithelial / LPF FEW (*)    Bacteria, UA MANY (*)    All other components within normal limits  URINE CULTURE  GC/CHLAMYDIA PROBE AMP  PREGNANCY, URINE    Imaging Review No results found.  EKG Interpretation   None       MDM   1. UTI (urinary tract infection)   2. Trichomonas     12:18 PM Pelvic exam done. Urine pregnancy negative. Urinalysis shows UTI. Wet prep and GC Chlamydia pending. Vitals stable and patient afebrile.   12:48 PM Patient's wet prep shows trichomonas and many WBC. Patient will be treated for trichomonas as well as GC/Chlamydia. Patient will be contacted in 48 hours if GC/Chlamydia is positive. Patient will also be treated for UTI with keflex. No further evaluation needed at this time.   Emilia Beck, PA-C 10/23/12 1254

## 2012-10-23 NOTE — ED Provider Notes (Signed)
  Medical screening examination/treatment/procedure(s) were performed by non-physician practitioner and as supervising physician I was immediately available for consultation/collaboration.    Gerhard Munch, MD 10/23/12 [phone]

## 2012-10-24 LAB — URINE CULTURE
Colony Count: NO GROWTH
Culture: NO GROWTH

## 2012-10-24 LAB — GC/CHLAMYDIA PROBE AMP
CT Probe RNA: NEGATIVE
GC Probe RNA: NEGATIVE

## 2012-12-04 ENCOUNTER — Emergency Department (HOSPITAL_BASED_OUTPATIENT_CLINIC_OR_DEPARTMENT_OTHER)
Admission: EM | Admit: 2012-12-04 | Discharge: 2012-12-04 | Disposition: A | Discharge status: Home / Self Care | Payer: Medicaid Other | Attending: Emergency Medicine | Admitting: Emergency Medicine

## 2012-12-04 ENCOUNTER — Encounter (HOSPITAL_BASED_OUTPATIENT_CLINIC_OR_DEPARTMENT_OTHER): Payer: Self-pay | Admitting: Emergency Medicine

## 2012-12-04 DIAGNOSIS — R509 Fever, unspecified: Secondary | ICD-10-CM | POA: Insufficient documentation

## 2012-12-04 DIAGNOSIS — I10 Essential (primary) hypertension: Secondary | ICD-10-CM | POA: Insufficient documentation

## 2012-12-04 DIAGNOSIS — Z79899 Other long term (current) drug therapy: Secondary | ICD-10-CM | POA: Insufficient documentation

## 2012-12-04 DIAGNOSIS — B309 Viral conjunctivitis, unspecified: Secondary | ICD-10-CM

## 2012-12-04 DIAGNOSIS — J029 Acute pharyngitis, unspecified: Secondary | ICD-10-CM | POA: Insufficient documentation

## 2012-12-04 MED ORDER — TETRACAINE HCL 0.5 % OP SOLN
OPHTHALMIC | Status: AC
  Administered 2012-12-04: 2 [drp] via OPHTHALMIC
  Filled 2012-12-04: qty 2

## 2012-12-04 MED ORDER — TETRACAINE HCL 0.5 % OP SOLN: 1.0000 [drp] | Freq: Once | OPHTHALMIC | Status: AC

## 2012-12-04 MED ORDER — HYPROMELLOSE (GONIOSCOPIC) 2.5 % OP SOLN: 1.0000 [drp] | Freq: Four times a day (QID) | OPHTHALMIC | Status: DC | PRN

## 2012-12-04 MED ORDER — FLUORESCEIN SODIUM 1 MG OP STRP: 1.0000 | ORAL_STRIP | Freq: Once | OPHTHALMIC | Status: AC

## 2012-12-04 MED ORDER — FLUORESCEIN SODIUM 1 MG OP STRP
ORAL_STRIP | OPHTHALMIC | Status: AC
  Administered 2012-12-04: 1 via OPHTHALMIC
  Filled 2012-12-04: qty 1

## 2012-12-04 NOTE — ED Notes (Signed)
Patient states she has had a recent upper respiratory infection.  States she woke up this morning with left ear pain and redness and yellow drainage from her right eye.

## 2012-12-04 NOTE — ED Provider Notes (Signed)
Patient seen and evaluated. I agree with the above description. Erythema of the right eye with conjunctiva injection of the bulbar and palpebral conjunctiva. No prevertebral adenopathy. Through the exam as described above. Patient had URI symptoms and this is a viral conjunctivitis. She'll be treated as such.  Roney Marion, MD 12/04/12 0830

## 2012-12-04 NOTE — ED Provider Notes (Signed)
CSN: [id number]     Arrival date & time 12/04/12  3086 History   First MD Initiated Contact with Patient 12/04/12 [phone]     Chief Complaint  Patient presents with  . Conjunctivitis   (Consider location/radiation/quality/duration/timing/severity/associated sxs/prior Treatment) Patient is a 32 y.o. female presenting with conjunctivitis. The history is provided by the patient.  Conjunctivitis This is a new problem. The current episode started yesterday. The problem occurs constantly. The problem has been gradually worsening. Associated symptoms include a fever and a sore throat. Pertinent negatives include no abdominal pain, fatigue or rash.   Patient has had URI symptoms for 4 days. Yesterday started having dry red scratchy eye. Woke this morning with worsening clear drainage and eye discomfort. No fevers chills diplopia nasea vomitting.  Past Medical History  Diagnosis Date  . Hypertension    Past Surgical History  Procedure Laterality Date  . Cesarean section    . Tubal ligation     No family history on file. History  Substance Use Topics  . Smoking status: Never Smoker   . Smokeless tobacco: Not on file  . Alcohol Use: No   OB History   Grav Para Term Preterm Abortions TAB SAB Ect Mult Living                 Review of Systems  Constitutional: Positive for fever. Negative for fatigue.  HENT: Positive for sore throat.   Gastrointestinal: Negative for abdominal pain.  Skin: Negative for rash.    Allergies  Review of patient's allergies indicates no known allergies.  Home Medications   Current Outpatient Rx  Name  Route  Sig  Dispense  Refill  . acetaminophen (TYLENOL) 500 MG tablet   Oral   Take 500 mg by mouth 2 (two) times daily as needed for pain. For pain         . amLODipine (NORVASC) 10 MG tablet   Oral   Take 10 mg by mouth daily.         . cephALEXin (KEFLEX) 500 MG capsule   Oral   Take 1 capsule (500 mg total) by mouth 4 (four) times daily.   40  capsule   0   . hydrochlorothiazide (HYDRODIURIL) 25 MG tablet   Oral   Take 25 mg by mouth daily.         Marland Kitchen HYDROcodone-acetaminophen (NORCO/VICODIN) 5-325 MG per tablet   Oral   Take 2 tablets by mouth every 4 (four) hours as needed for pain.   6 tablet   0   . ibuprofen (ADVIL,MOTRIN) 800 MG tablet   Oral   Take 800 mg by mouth every 8 (eight) hours as needed for pain.          BP 140/104  Temp(Src) 98.4 F (36.9 C) (Oral)  Resp 20  Ht 5\' 6"  (1.676 m)  Wt 210 lb (95.255 kg)  BMI 33.91 kg/m2  SpO2 100% Physical Exam  Constitutional: She appears well-developed and well-nourished. No distress.  Eyes: Pupils are equal, round, and reactive to light. Lids are everted and swept, no foreign bodies found. Right eye exhibits discharge. Right eye exhibits no chemosis, no exudate and no hordeolum. No foreign body present in the right eye. Left eye exhibits no chemosis, no discharge, no exudate and no hordeolum. No foreign body present in the left eye. Right conjunctiva is injected. Right conjunctiva has no hemorrhage. Left conjunctiva is not injected. Left conjunctiva has no hemorrhage. No scleral icterus. Right eye exhibits  normal extraocular motion and no nystagmus. Left eye exhibits no nystagmus. Right pupil is round and reactive. Left pupil is round and reactive. Pupils are equal.  Slit lamp exam:      The right eye shows no corneal abrasion, no corneal flare, no corneal ulcer, no foreign body, no hyphema, no hypopyon, no fluorescein uptake and no anterior chamber bulge.       The left eye shows no corneal abrasion, no corneal flare, no corneal ulcer, no foreign body, no hyphema, no hypopyon, no fluorescein uptake and no anterior chamber bulge.    Skin: She is not diaphoretic.   OS: VA 20/25 OD 20/40  ED Course  Procedures (including critical care time) Labs Review Labs Reviewed - No data to display Imaging Review No results found.  EKG Interpretation   None        MDM   1. Viral conjunctivitis     1. Viral Conjunctivitis The patients eye pain, blurry vision, and discharge are likely due to acute viral conjunctivitis. There is no corneal opacity concerning for ulcer, there is now proptosis diplopia, EOM restriction to indicate further workup at this time.  I recommended the patient use preservative free artifical tears. I told the patient that the infection was likely to spread and effect the other eye. I instructed the patient to f/u with ED or ophthalmologist for new or worsening symptoms. The patient agreed with this plan.     Pleas Koch, MD 12/04/12 [phone]

## 2013-04-26 ENCOUNTER — Emergency Department (HOSPITAL_BASED_OUTPATIENT_CLINIC_OR_DEPARTMENT_OTHER)
Admission: EM | Admit: 2013-04-26 | Discharge: 2013-04-26 | Disposition: A | Discharge status: Home / Self Care | Payer: Medicaid Other | Attending: Emergency Medicine | Admitting: Emergency Medicine

## 2013-04-26 ENCOUNTER — Emergency Department (HOSPITAL_BASED_OUTPATIENT_CLINIC_OR_DEPARTMENT_OTHER): Payer: Medicaid Other

## 2013-04-26 ENCOUNTER — Encounter (HOSPITAL_BASED_OUTPATIENT_CLINIC_OR_DEPARTMENT_OTHER): Payer: Self-pay | Admitting: Emergency Medicine

## 2013-04-26 DIAGNOSIS — R51 Headache: Secondary | ICD-10-CM | POA: Insufficient documentation

## 2013-04-26 DIAGNOSIS — R079 Chest pain, unspecified: Secondary | ICD-10-CM | POA: Insufficient documentation

## 2013-04-26 DIAGNOSIS — N189 Chronic kidney disease, unspecified: Secondary | ICD-10-CM

## 2013-04-26 DIAGNOSIS — Z792 Long term (current) use of antibiotics: Secondary | ICD-10-CM | POA: Insufficient documentation

## 2013-04-26 DIAGNOSIS — R609 Edema, unspecified: Secondary | ICD-10-CM | POA: Insufficient documentation

## 2013-04-26 DIAGNOSIS — R05 Cough: Secondary | ICD-10-CM | POA: Insufficient documentation

## 2013-04-26 DIAGNOSIS — Z79899 Other long term (current) drug therapy: Secondary | ICD-10-CM | POA: Insufficient documentation

## 2013-04-26 DIAGNOSIS — N183 Chronic kidney disease, stage 3 unspecified: Secondary | ICD-10-CM | POA: Insufficient documentation

## 2013-04-26 DIAGNOSIS — R209 Unspecified disturbances of skin sensation: Secondary | ICD-10-CM | POA: Insufficient documentation

## 2013-04-26 DIAGNOSIS — R059 Cough, unspecified: Secondary | ICD-10-CM | POA: Insufficient documentation

## 2013-04-26 DIAGNOSIS — I129 Hypertensive chronic kidney disease with stage 1 through stage 4 chronic kidney disease, or unspecified chronic kidney disease: Secondary | ICD-10-CM | POA: Insufficient documentation

## 2013-04-26 LAB — BASIC METABOLIC PANEL
BUN: 11 mg/dL (ref 6–23)
CO2: 24 mEq/L (ref 19–32)
Calcium: 9.5 mg/dL (ref 8.4–10.5)
Chloride: 103 mEq/L (ref 96–112)
Creatinine, Ser: 1.3 mg/dL — ABNORMAL HIGH (ref 0.50–1.10)
GFR calc Af Amer: 61 mL/min — ABNORMAL LOW (ref >90)
GFR calc non Af Amer: 52 mL/min — ABNORMAL LOW (ref >90)
Glucose, Bld: 92 mg/dL (ref 70–99)
Potassium: 3.4 mEq/L — ABNORMAL LOW (ref 3.7–5.3)
Sodium: 141 mEq/L (ref 137–147)

## 2013-04-26 LAB — URINALYSIS, ROUTINE W REFLEX MICROSCOPIC
Bilirubin Urine: NEGATIVE
Glucose, UA: NEGATIVE mg/dL
Hgb urine dipstick: NEGATIVE
Ketones, ur: NEGATIVE mg/dL
Leukocytes, UA: NEGATIVE
Nitrite: NEGATIVE
Protein, ur: NEGATIVE mg/dL
Specific Gravity, Urine: 1.017 (ref 1.005–1.030)
Urobilinogen, UA: 0.2 mg/dL (ref 0.0–1.0)
pH: 6 (ref 5.0–8.0)

## 2013-04-26 MED ORDER — HYDROCHLOROTHIAZIDE 25 MG PO TABS: 25.0000 mg | ORAL_TABLET | Freq: Every day | ORAL | Status: DC

## 2013-04-26 NOTE — ED Provider Notes (Signed)
CSN: [id number]     Arrival date & time 04/26/13  2026 History  This chart was scribed for Rolland PorterMark James, MD by Ardelia Memsylan Malpass, ED Scribe. This patient was seen in room MH03/MH03 and the patient's care was started at 9:25 PM.    Chief Complaint  Patient presents with  . Leg Swelling    The history is provided by the patient. No language interpreter was used.    HPI Comments: Maria Bradford is a 33 y.o. Female with a history of HTN who presents to the Emergency Department complaining of constant, waxing and waning bilateral leg swelling onset yesterday. She states that she was discovered by her PCP to have stage 3 kidney disease about a month ago. She states that she was referred to a Nephrologist last month and was started on HCTZ 12.5 mg. She states that she has no history of leg swelling prior to the past 2 days. She also states that she has had decreased urine output recently, but that she still urinates 1-2 times a day. She states that she does not monitor her weight. She further states that she has had a cough, productive of clear sputum recently. She states that she has also had intermiittent headaches and intermittent tingling in the fingertips of her bilateral hands. She also states that she has bee having mild upper chest pain the past several mornings that resolves.    Past Medical History  Diagnosis Date  . Hypertension    Past Surgical History  Procedure Laterality Date  . Cesarean section    . Tubal ligation     No family history on file. History  Substance Use Topics  . Smoking status: Never Smoker   . Smokeless tobacco: Not on file  . Alcohol Use: No   OB History   Grav Para Term Preterm Abortions TAB SAB Ect Mult Living                 Review of Systems  Constitutional: Negative for fever, chills, diaphoresis, appetite change and fatigue.  HENT: Negative for mouth sores, sore throat and trouble swallowing.   Eyes: Negative for visual disturbance.  Respiratory:  Positive for cough. Negative for chest tightness, shortness of breath and wheezing.   Cardiovascular: Positive for chest pain and leg swelling.  Gastrointestinal: Negative for nausea, vomiting, abdominal pain, diarrhea and abdominal distention.  Endocrine: Negative for polydipsia, polyphagia and polyuria.  Genitourinary: Negative for dysuria, frequency and hematuria.  Musculoskeletal: Negative for gait problem.  Skin: Negative for color change, pallor and rash.  Neurological: Positive for headaches. Negative for dizziness, syncope and light-headedness.  Hematological: Does not bruise/bleed easily.  Psychiatric/Behavioral: Negative for behavioral problems and confusion.    Allergies  Review of patient's allergies indicates no known allergies.  Home Medications   Prior to Admission medications   Medication Sig Start Date End Date Taking? Authorizing Provider  acetaminophen (TYLENOL) 500 MG tablet Take 500 mg by mouth 2 (two) times daily as needed for pain. For pain   Yes Historical Provider, MD  amLODipine (NORVASC) 10 MG tablet Take 10 mg by mouth daily.   Yes Historical Provider, MD  hydrochlorothiazide (HYDRODIURIL) 25 MG tablet Take 12.5 mg by mouth daily.  07/22/12  Yes Lonia SkinnerLeslie K Sofia, PA-C  ibuprofen (ADVIL,MOTRIN) 800 MG tablet Take 800 mg by mouth every 8 (eight) hours as needed for pain.   Yes Historical Provider, MD  metoprolol tartrate (LOPRESSOR) 25 MG tablet Take 25 mg by mouth 2 (two) times daily.  Yes Historical Provider, MD  cephALEXin (KEFLEX) 500 MG capsule Take 1 capsule (500 mg total) by mouth 4 (four) times daily. 10/23/12   Emilia BeckKaitlyn Szekalski, PA-C  HYDROcodone-acetaminophen (NORCO/VICODIN) 5-325 MG per tablet Take 2 tablets by mouth every 4 (four) hours as needed for pain. 08/20/12   Enid SkeensJoshua M Zavitz, MD  hydroxypropyl methylcellulose (ISOPTO TEARS) 2.5 % ophthalmic solution Place 1 drop into both eyes 4 (four) times daily as needed for dry eyes. 12/04/12   Pleas Kochhristopher  Komanski, MD   Triage Vitals: BP 147/102  Pulse 89  Temp(Src) 99.2 F (37.3 C) (Oral)  Resp 18  Ht 5\' 6"  (1.676 m)  Wt 206 lb (93.441 kg)  BMI 33.27 kg/m2  SpO2 100%  Physical Exam  Nursing note and vitals reviewed. Constitutional: She is oriented to person, place, and time. She appears well-developed and well-nourished. No distress.  HENT:  Head: Normocephalic.  Eyes: Conjunctivae are normal. Pupils are equal, round, and reactive to light. No scleral icterus.  Neck: Normal range of motion. Neck supple. No thyromegaly present.  Cardiovascular: Normal rate and regular rhythm.  Exam reveals no gallop and no friction rub.   No murmur heard. Pulmonary/Chest: Effort normal and breath sounds normal. No respiratory distress. She has no wheezes. She has no rales.  Abdominal: Soft. Bowel sounds are normal. She exhibits no distension. There is no tenderness. There is no rebound.  Musculoskeletal: Normal range of motion. She exhibits edema.  Trace lower extremity symmetric edema.  Neurological: She is alert and oriented to person, place, and time.  Skin: Skin is warm and dry. No rash noted.  Psychiatric: She has a normal mood and affect. Her behavior is normal.    ED Course  Procedures (including critical care time)  DIAGNOSTIC STUDIES: Oxygen Saturation is 100% on RA, normal by my interpretation.    COORDINATION OF CARE: 9:30 PM- Pt advised of plan for treatment and pt agrees.  Labs Review Labs Reviewed  BASIC METABOLIC PANEL - Abnormal; Notable for the following:    Potassium 3.4 (*)    Creatinine, Ser 1.30 (*)    GFR calc non Af Amer 52 (*)    GFR calc Af Amer 61 (*)    All other components within normal limits  URINALYSIS, ROUTINE W REFLEX MICROSCOPIC    Imaging Review No results found.   EKG Interpretation None      MDM   Final diagnoses:  Dependent edema  Chronic kidney disease   Creatinine at baseline. No set congestive heart failure. No protein in her  urine. Plan will be to stop her Norvasc. Increase her hydrochlorothiazide. Followup with her physician within the next 2-3 weeks.  I personally performed the services described in this documentation, which was scribed in my presence. The recorded information has been reviewed and is accurate.   Rolland PorterMark James, MD 04/26/13 2223

## 2013-04-26 NOTE — ED Notes (Signed)
Bilateral leg swelling since yesterday- swelling went down overnight and then happened again today

## 2013-04-26 NOTE — Discharge Instructions (Signed)
Stop taking your Norvasc, it can cause lower extremity swelling and some patients. Increased her hydrochlorothiazide to 25 mg per day. Recheck with your physician within the next 2-3 weeks.  Chronic Kidney Disease Chronic kidney disease occurs when the kidneys are damaged over a long period. The kidneys are two organs that lie on either side of the spine between the middle of the back and the front of the abdomen. The kidneys:   Remove wastes and extra water from the blood.   Produce important hormones. These help keep bones strong, regulate blood pressure, and help create red blood cells.   Balance the fluids and chemicals in the blood and tissues. A small amount of kidney damage may not cause problems, but a large amount of damage may make it difficult or impossible for the kidneys to work the way they should. If steps are not taken to slow down the kidney damage or stop it from getting worse, the kidneys may stop working permanently. Most of the time, chronic kidney disease does not go away. However, it can often be controlled, and those with the disease can usually live normal lives. CAUSES  The most common causes of chronic kidney disease are diabetes and high blood pressure (hypertension). Chronic kidney disease may also be caused by:   Diseases that cause kidneys' filters to become inflamed.   Diseases that affect the immune system.   Genetic diseases.   Medicines that damage the kidneys, such as anti-inflammatory medicines.  Poisoning or exposure to toxic substances.   A reoccurring kidney or urinary infection.   A problem with urine flow. This may be caused by:   Cancer.   Kidney stones.   An enlarged prostate in males. SYMPTOMS  Because the kidney damage in chronic kidney disease occurs slowly, symptoms develop slowly and may not be obvious until the kidney damage becomes severe. A person may have a kidney disease for years without showing any symptoms.  Symptoms can include:   Swelling (edema) of the legs, ankles, or feet.   Tiredness (lethargy).   Nausea or vomiting.   Confusion.   Problems with urination, such as:   Decreased urine production.   Frequent urination, especially at night.   Frequent accidents in children who are potty trained.   Muscle twitches and cramps.   Shortness of breath.  Weakness.   Persistent itchiness.   Loss of appetite.  Metallic taste in the mouth.  Trouble sleeping.  Slowed development in children.  Short stature in children. DIAGNOSIS  Chronic kidney disease may be detected and diagnosed by tests, including blood, urine, imaging, or kidney biopsy tests.  TREATMENT  Most chronic kidney diseases cannot be cured. Treatment usually involves relieving symptoms and preventing or slowing the progression of the disease. Treatment may include:   A special diet. You may need to avoid alcohol and foods thatare salty and high in potassium.   Medicines. These may:   Lower blood pressure.   Relieve anemia.   Relieve swelling.   Protect the bones. HOME CARE INSTRUCTIONS   Follow your prescribed diet.   Only take over-the-counter or prescription medicines as directed by your caregiver.  Do not take any new medicines (prescription, over-the-counter, or nutritional supplements) unless approved by your caregiver. Many medicines can worsen your kidney damage or need to have the dose adjusted.   Quit smoking if you are a smoker. Talk to your caregiver about a smoking cessation program.   Keep all follow-up appointments as directed by your  caregiver. SEEK IMMEDIATE MEDICAL CARE IF:  Your symptoms get worse or you develop new symptoms.   You develop symptoms of end-stage kidney disease. These include:   Headaches.   Abnormally dark or light skin.   Numbness in the hands or feet.   Easy bruising.   Frequent hiccups.   Menstruation stops.   You have  a fever.   You have decreased urine production.   You havepain or bleeding when urinating. MAKE SURE YOU:  Understand these instructions.  Will watch your condition.  Will get help right away if you are not doing well or get worse. FOR MORE INFORMATION  American Association of Kidney Patients: ResidentialShow.iswww.aakp.org National Kidney Foundation: www.kidney.org American Kidney Fund: FightingMatch.com.eewww.akfinc.org Life [facility]: www.lifeoptions.org and www.kidneyschool.org Document Released: 10/05/2007 Document Revised: 12/13/2011 Document Reviewed: 08/25/2011 [facility] Patient Information 2014 [place], [place].  Edema Edema is a buildup of fluids. It is most common in the feet, ankles, and legs. This happens more as a person ages. It may affect one or both legs. HOME CARE   Raise (elevate) the legs or ankles above the level of the heart while lying down.  Avoid sitting or standing still for a long time.  Exercise the legs to help the puffiness (swelling) go down.  A low-salt diet may help lessen the puffiness.  Only take medicine as told by your doctor. GET HELP RIGHT AWAY IF:   You develop shortness of breath or chest pain.  You cannot breathe when you lie down.  You have more puffiness that does not go away with treatment.  You develop pain or redness in the areas that are puffy.  You have a temperature by mouth above 102 F (38.9 C), not controlled by medicine.  You gain 03 lb/1.4 kg or more in 1 day or 05 lb/2.3 kg in a week. MAKE SURE YOU:   Understand these instructions.  Will watch your condition.  Will get help right away if you are not doing well or get worse. Document Released: 06/14/2007 Document Revised: 03/20/2011 Document Reviewed: 06/14/2007 [facility] Patient Information 2014 [place], [place].

## 2014-01-16 ENCOUNTER — Emergency Department (HOSPITAL_BASED_OUTPATIENT_CLINIC_OR_DEPARTMENT_OTHER)
Admission: EM | Admit: 2014-01-16 | Discharge: 2014-01-16 | Disposition: A | Discharge status: Home / Self Care | Payer: Medicaid Other | Attending: Emergency Medicine | Admitting: Emergency Medicine

## 2014-01-16 ENCOUNTER — Encounter (HOSPITAL_BASED_OUTPATIENT_CLINIC_OR_DEPARTMENT_OTHER): Payer: Self-pay

## 2014-01-16 DIAGNOSIS — N739 Female pelvic inflammatory disease, unspecified: Secondary | ICD-10-CM | POA: Insufficient documentation

## 2014-01-16 DIAGNOSIS — Z79899 Other long term (current) drug therapy: Secondary | ICD-10-CM | POA: Insufficient documentation

## 2014-01-16 DIAGNOSIS — A5909 Other urogenital trichomoniasis: Secondary | ICD-10-CM | POA: Insufficient documentation

## 2014-01-16 DIAGNOSIS — Z9889 Other specified postprocedural states: Secondary | ICD-10-CM | POA: Insufficient documentation

## 2014-01-16 DIAGNOSIS — Z9851 Tubal ligation status: Secondary | ICD-10-CM | POA: Insufficient documentation

## 2014-01-16 DIAGNOSIS — Z792 Long term (current) use of antibiotics: Secondary | ICD-10-CM | POA: Insufficient documentation

## 2014-01-16 DIAGNOSIS — N730 Acute parametritis and pelvic cellulitis: Secondary | ICD-10-CM

## 2014-01-16 DIAGNOSIS — I10 Essential (primary) hypertension: Secondary | ICD-10-CM | POA: Insufficient documentation

## 2014-01-16 DIAGNOSIS — Z3202 Encounter for pregnancy test, result negative: Secondary | ICD-10-CM | POA: Insufficient documentation

## 2014-01-16 LAB — URINALYSIS, ROUTINE W REFLEX MICROSCOPIC
Bilirubin Urine: NEGATIVE
Glucose, UA: NEGATIVE mg/dL
Hgb urine dipstick: NEGATIVE
Ketones, ur: NEGATIVE mg/dL
Nitrite: NEGATIVE
Protein, ur: 30 mg/dL — AB
Specific Gravity, Urine: 1.024 (ref 1.005–1.030)
Urobilinogen, UA: 1 mg/dL (ref 0.0–1.0)
pH: 6 (ref 5.0–8.0)

## 2014-01-16 LAB — WET PREP, GENITAL: Yeast Wet Prep HPF POC: NONE SEEN

## 2014-01-16 LAB — URINE MICROSCOPIC-ADD ON

## 2014-01-16 LAB — PREGNANCY, URINE: Preg Test, Ur: NEGATIVE

## 2014-01-16 MED ORDER — AZITHROMYCIN 250 MG PO TABS
1000.0000 mg | ORAL_TABLET | Freq: Once | ORAL | Status: AC
  Administered 2014-01-16: 1000 mg via ORAL
  Filled 2014-01-16: qty 4

## 2014-01-16 MED ORDER — LIDOCAINE HCL (PF) 1 % IJ SOLN
INTRAMUSCULAR | Status: AC
  Administered 2014-01-16: 2.3 mL
  Filled 2014-01-16: qty 5

## 2014-01-16 MED ORDER — DOXYCYCLINE HYCLATE 100 MG PO CAPS: 100.0000 mg | ORAL_CAPSULE | Freq: Two times a day (BID) | ORAL | Status: DC

## 2014-01-16 MED ORDER — CEFTRIAXONE SODIUM 250 MG IJ SOLR
250.0000 mg | Freq: Once | INTRAMUSCULAR | Status: AC
  Administered 2014-01-16: 250 mg via INTRAMUSCULAR
  Filled 2014-01-16: qty 250

## 2014-01-16 MED ORDER — ONDANSETRON 4 MG PO TBDP
4.0000 mg | ORAL_TABLET | Freq: Once | ORAL | Status: AC
  Administered 2014-01-16: 4 mg via ORAL
  Filled 2014-01-16: qty 1

## 2014-01-16 MED ORDER — METRONIDAZOLE 500 MG PO TABS
2000.0000 mg | ORAL_TABLET | Freq: Once | ORAL | Status: AC
  Administered 2014-01-16: 2000 mg via ORAL
  Filled 2014-01-16: qty 4

## 2014-01-16 NOTE — ED Provider Notes (Signed)
CSN: [id number]     Arrival date & time 01/16/14  0906 History   First MD Initiated Contact with Patient 01/16/14 0914     Chief Complaint  Patient presents with  . Abdominal Pain     (Consider location/radiation/quality/duration/timing/severity/associated sxs/prior Treatment) Patient is a 34 y.o. female presenting with abdominal pain. The history is provided by the patient.  Abdominal Pain Pain location:  Suprapubic Pain quality: aching   Pain radiates to:  Does not radiate Pain severity:  Mild Onset quality:  Gradual Duration:  3 days Timing:  Intermittent Progression:  Worsening Chronicity:  New Context: recent sexual activity   Relieved by:  Nothing Worsened by:  Nothing tried Associated symptoms: dysuria and vaginal discharge (noitced yellow discharge that began today.)   Associated symptoms: no cough, no fever, no nausea, no shortness of breath, no vaginal bleeding and no vomiting     Past Medical History  Diagnosis Date  . Hypertension    Past Surgical History  Procedure Laterality Date  . Cesarean section    . Tubal ligation     No family history on file. History  Substance Use Topics  . Smoking status: Never Smoker   . Smokeless tobacco: Not on file  . Alcohol Use: No   OB History    No data available     Review of Systems  Constitutional: Negative for fever.  Respiratory: Negative for cough and shortness of breath.   Gastrointestinal: Positive for abdominal pain. Negative for nausea and vomiting.  Genitourinary: Positive for dysuria and vaginal discharge (noitced yellow discharge that began today.). Negative for vaginal bleeding.  All other systems reviewed and are negative.     Allergies  Review of patient's allergies indicates no known allergies.  Home Medications   Prior to Admission medications   Medication Sig Start Date End Date Taking? Authorizing Provider  acetaminophen (TYLENOL) 500 MG tablet Take 500 mg by mouth 2 (two) times daily  as needed for pain. For pain    Historical Provider, MD  amLODipine (NORVASC) 10 MG tablet Take 10 mg by mouth daily.    Historical Provider, MD  cephALEXin (KEFLEX) 500 MG capsule Take 1 capsule (500 mg total) by mouth 4 (four) times daily. 10/23/12   Kaitlyn Szekalski, PA-C  hydrochlorothiazide (HYDRODIURIL) 25 MG tablet Take 12.5 mg by mouth daily.  07/22/12   Elson Areas, PA-C  hydrochlorothiazide (HYDRODIURIL) 25 MG tablet Take 1 tablet (25 mg total) by mouth daily. 04/26/13   Rolland Porter, MD  HYDROcodone-acetaminophen (NORCO/VICODIN) 5-325 MG per tablet Take 2 tablets by mouth every 4 (four) hours as needed for pain. 08/20/12   Enid Skeens, MD  hydroxypropyl methylcellulose (ISOPTO TEARS) 2.5 % ophthalmic solution Place 1 drop into both eyes 4 (four) times daily as needed for dry eyes. 12/04/12   Bobbye Charleston, MD  ibuprofen (ADVIL,MOTRIN) 800 MG tablet Take 800 mg by mouth every 8 (eight) hours as needed for pain.    Historical Provider, MD  metoprolol tartrate (LOPRESSOR) 25 MG tablet Take 25 mg by mouth 2 (two) times daily.    Historical Provider, MD   BP 184/128 mmHg  Pulse 82  Temp(Src) 97.9 F (36.6 C) (Oral)  Resp 14  Ht  (1.702 m)  Wt 205 lb (92.987 kg)  BMI 32.10 kg/m2  SpO2 100% Physical Exam  Constitutional: She is oriented to person, place, and time. She appears well-developed and well-nourished. No distress.  HENT:  Head: Normocephalic and atraumatic.  Mouth/Throat:  Oropharynx is clear and moist.  Eyes: EOM are normal. Pupils are equal, round, and reactive to light.  Neck: Normal range of motion. Neck supple.  Cardiovascular: Normal rate and regular rhythm.  Exam reveals no friction rub.   No murmur heard. Pulmonary/Chest: Effort normal and breath sounds normal. No respiratory distress. She has no wheezes. She has no rales.  Abdominal: Soft. She exhibits no distension. There is tenderness (mild, diffuse lower). There is no rebound.  Genitourinary:  Cervix exhibits motion tenderness and discharge (mild, yellow). Right adnexum displays tenderness.  Musculoskeletal: Normal range of motion. She exhibits no edema.  Neurological: She is alert and oriented to person, place, and time.  Skin: No rash noted. She is not diaphoretic.  Nursing note and vitals reviewed.   ED Course  Procedures (including critical care time) Labs Review Labs Reviewed  URINALYSIS, ROUTINE W REFLEX MICROSCOPIC - Abnormal; Notable for the following:    APPearance CLOUDY (*)    Protein, ur 30 (*)    Leukocytes, UA LARGE (*)    All other components within normal limits  URINE MICROSCOPIC-ADD ON - Abnormal; Notable for the following:    Squamous Epithelial / LPF MANY (*)    Bacteria, UA MANY (*)    All other components within normal limits  PREGNANCY, URINE    Imaging Review No results found.   EKG Interpretation None      MDM   Final diagnoses:  Trichomonal cervicitis  PID (acute pelvic inflammatory disease)    51F here with lower abdominal pain for past 3 days. Intermittent. Noticed yellow vaginal discharge this morning. No fevers, vomiting. No systemic symptoms. Mild lower abdominal pain on exam, pelvic exam shows thin yellow discharge, mild red spot on her cervix with mild cervical and R adnexal tenderness. Urine shows trichomonas, as does wet prep. Will treat and cover for GC/Ch. No dysuria, UA results like due to STDs. Will cover for PID with doxycycline. Instructed to f/u with PCP.  Elwin MochaBlair Walden, MD 01/16/14 [phone]

## 2014-01-16 NOTE — Discharge Instructions (Signed)
Pelvic Inflammatory Disease °Pelvic inflammatory disease (PID) refers to an infection in some or all of the female organs. The infection can be in the uterus, ovaries, fallopian tubes, or the surrounding tissues in the pelvis. PID can cause abdominal or pelvic pain that comes on suddenly (acute pelvic pain). PID is a serious infection because it can lead to lasting (chronic) pelvic pain or the inability to have children (infertile).  °CAUSES  °The infection is often caused by the normal bacteria found in the vaginal tissues. PID may also be caused by an infection that is spread during sexual contact. PID can also occur following:  °· The birth of a baby.   °· A miscarriage.   °· An abortion.   °· Major pelvic surgery.   °· The use of an intrauterine device (IUD).   °· A sexual assault.   °RISK FACTORS °Certain factors can put a person at higher risk for PID, such as: °· Being younger than 25 years. °· Being sexually active at a young age. °· Using nonbarrier contraception. °· Having multiple sexual partners. °· Having sex with someone who has symptoms of a genital infection. °· Using oral contraception. °Other times, certain behaviors can increase the possibility of getting PID, such as: °· Having sex during your period. °· Using a vaginal douche. °· Having an intrauterine device (IUD) in place. °SYMPTOMS  °· Abdominal or pelvic pain.   °· Fever.   °· Chills.   °· Abnormal vaginal discharge. °· Abnormal uterine bleeding.   °· Unusual pain shortly after finishing your period. °DIAGNOSIS  °Your caregiver will choose some of the following methods to make a diagnosis, such as:  °· Performing a physical exam and history. A pelvic exam typically reveals a very tender uterus and surrounding pelvis.   °· Ordering laboratory tests including a pregnancy test, blood tests, and urine test.  °· Ordering cultures of the vagina and cervix to check for a sexually transmitted infection (STI). °· Performing an ultrasound.    °· Performing a laparoscopic procedure to look inside the pelvis.   °TREATMENT  °· Antibiotic medicines may be prescribed and taken by mouth.   °· Sexual partners may be treated when the infection is caused by a sexually transmitted disease (STD).   °· Hospitalization may be needed to give antibiotics intravenously. °· Surgery may be needed, but this is rare. °It may take weeks until you are completely well. If you are diagnosed with PID, you should also be checked for human immunodeficiency virus (HIV).   °HOME CARE INSTRUCTIONS  °· If given, take your antibiotics as directed. Finish the medicine even if you start to feel better.   °· Only take over-the-counter or prescription medicines for pain, discomfort, or fever as directed by your caregiver.   °· Do not have sexual intercourse until treatment is completed or as directed by your caregiver. If PID is confirmed, your recent sexual partner(s) will need treatment.   °· Keep your follow-up appointments. °SEEK MEDICAL CARE IF:  °· You have increased or abnormal vaginal discharge.   °· You need prescription medicine for your pain.   °· You vomit.   °· You cannot take your medicines.   °· Your partner has an STD.   °SEEK IMMEDIATE MEDICAL CARE IF:  °· You have a fever.   °· You have increased abdominal or pelvic pain.   °· You have chills.   °· You have pain when you urinate.   °· You are not better after 72 hours following treatment.   °MAKE SURE YOU:  °· Understand these instructions. °· Will watch your condition. °· Will get help right away if you are not doing well or get worse. °  Document Released: 12/26/2004 Document Revised: 04/22/2012 Document Reviewed: 12/22/2010 [facility] Patient Information 2015 [place], [place]. This information is not intended to replace advice given to you by your health care provider. Make sure you discuss any questions you have with your health care provider.  Trichomoniasis Trichomoniasis is an infection caused by an organism  called Trichomonas. The infection can affect both women and men. In women, the outer female genitalia and the vagina are affected. In men, the penis is mainly affected, but the prostate and other reproductive organs can also be involved. Trichomoniasis is a sexually transmitted infection (STI) and is most often passed to another person through sexual contact.  RISK FACTORS  Having unprotected sexual intercourse.  Having sexual intercourse with an infected partner. SIGNS AND SYMPTOMS  Symptoms of trichomoniasis in women include:  Abnormal gray-green frothy vaginal discharge.  Itching and irritation of the vagina.  Itching and irritation of the area outside the vagina. Symptoms of trichomoniasis in men include:   Penile discharge with or without pain.  Pain during urination. This results from inflammation of the urethra. DIAGNOSIS  Trichomoniasis may be found during a Pap test or physical exam. Your health care provider may use one of the following methods to help diagnose this infection:  Examining vaginal discharge under a microscope. For men, urethral discharge would be examined.  Testing the pH of the vagina with a test tape.  Using a vaginal swab test that checks for the Trichomonas organism. A test is available that provides results within a few minutes.  Doing a culture test for the organism. This is not usually needed. TREATMENT   You may be given medicine to fight the infection. Women should inform their health care provider if they could be or are pregnant. Some medicines used to treat the infection should not be taken during pregnancy.  Your health care provider may recommend over-the-counter medicines or creams to decrease itching or irritation.  Your sexual partner will need to be treated if infected. HOME CARE INSTRUCTIONS   Take medicines only as directed by your health care provider.  Take over-the-counter medicine for itching or irritation as directed by your  health care provider.  Do not have sexual intercourse while you have the infection.  Women should not douche or wear tampons while they have the infection.  Discuss your infection with your partner. Your partner may have gotten the infection from you, or you may have gotten it from your partner.  Have your sex partner get examined and treated if necessary.  Practice safe, informed, and protected sex.  See your health care provider for other STI testing. SEEK MEDICAL CARE IF:   You still have symptoms after you finish your medicine.  You develop abdominal pain.  You have pain when you urinate.  You have bleeding after sexual intercourse.  You develop a rash.  Your medicine makes you sick or makes you throw up (vomit). MAKE SURE YOU:  Understand these instructions.  Will watch your condition.  Will get help right away if you are not doing well or get worse. Document Released: 06/21/2000 Document Revised: 05/12/2013 Document Reviewed: 10/07/2012 [facility] Patient Information 2015 [place], [place]. This information is not intended to replace advice given to you by your health care provider. Make sure you discuss any questions you have with your health care provider.

## 2014-01-16 NOTE — ED Notes (Signed)
Lower abdominal pain since Tuesday. Noticed discharge this morning. Yellow in color, thin, foul odor. Denies dysuria. Sexually active with 1 partner last month, no condoms used.

## 2014-01-17 LAB — GC/CHLAMYDIA PROBE AMP
CT Probe RNA: NEGATIVE
GC Probe RNA: NEGATIVE

## 2016-05-08 ENCOUNTER — Emergency Department (HOSPITAL_BASED_OUTPATIENT_CLINIC_OR_DEPARTMENT_OTHER): Payer: Medicaid Other

## 2016-05-08 ENCOUNTER — Encounter (HOSPITAL_BASED_OUTPATIENT_CLINIC_OR_DEPARTMENT_OTHER): Payer: Self-pay | Admitting: Emergency Medicine

## 2016-05-08 ENCOUNTER — Emergency Department (HOSPITAL_BASED_OUTPATIENT_CLINIC_OR_DEPARTMENT_OTHER)
Admission: EM | Admit: 2016-05-08 | Discharge: 2016-05-08 | Disposition: A | Discharge status: Home / Self Care | Payer: Medicaid Other | Attending: Emergency Medicine | Admitting: Emergency Medicine

## 2016-05-08 DIAGNOSIS — Z79899 Other long term (current) drug therapy: Secondary | ICD-10-CM | POA: Insufficient documentation

## 2016-05-08 DIAGNOSIS — R0789 Other chest pain: Secondary | ICD-10-CM | POA: Insufficient documentation

## 2016-05-08 DIAGNOSIS — I10 Essential (primary) hypertension: Secondary | ICD-10-CM | POA: Insufficient documentation

## 2016-05-08 MED ORDER — IBUPROFEN 800 MG PO TABS
800.0000 mg | ORAL_TABLET | Freq: Once | ORAL | Status: AC
  Administered 2016-05-08: 800 mg via ORAL
  Filled 2016-05-08: qty 1

## 2016-05-08 MED ORDER — ONDANSETRON 4 MG PO TBDP
4.0000 mg | ORAL_TABLET | Freq: Once | ORAL | Status: AC
  Administered 2016-05-08: 4 mg via ORAL

## 2016-05-08 MED ORDER — ACETAMINOPHEN 500 MG PO TABS
1000.0000 mg | ORAL_TABLET | Freq: Once | ORAL | Status: AC
  Administered 2016-05-08: 1000 mg via ORAL
  Filled 2016-05-08: qty 2

## 2016-05-08 MED ORDER — ONDANSETRON 4 MG PO TBDP
ORAL_TABLET | ORAL | Status: AC
  Filled 2016-05-08: qty 1

## 2016-05-08 NOTE — ED Notes (Signed)
Patient transported to X-ray 

## 2016-05-08 NOTE — ED Triage Notes (Signed)
P c/o LT CP and LUE numbness intermittently since 3am

## 2016-05-08 NOTE — ED Provider Notes (Signed)
[facility] Provider Note   CSN: [id number] Arrival date & time: 05/08/16  1556   By signing my name below, I, Avnee Patel, attest that this documentation has been prepared under the direction and in the presence of Melene Plan, DO  Electronically Signed: Clovis Pu, ED Scribe. 05/08/16. 4:40 PM.   History   Chief Complaint Chief Complaint  Patient presents with  . Chest Pain    HPI Comments:  Maria Bradford is a 36 y.o. female, with a PMHx of HTN, who presents to the Emergency Department complaining of acute onset, intermittent, moderate, sharp chest pain beginning around 3 AM today. Pt states her pain awoke her from sleep. Her pain is worse with palpation. She also reports an occasional cough. Nothing makes her symptoms better or worse. No alleviating factors noted. Pt denies congestion, fevers, leg swelling. Hemoptysis or any other associated symptoms. Pt also denies any injuries to her chest, birth control use, recent long distance travel, recent surgeries, hx of DVT/PE or a hx of DM. Pt is as smoker. No other complaints noted at this time.  The history is provided by the patient. No language interpreter was used.  Chest Pain   This is a new problem. The current episode started 12 to 24 hours ago. The problem occurs hourly. The problem has not changed since onset.The pain is present in the substernal region. The pain is moderate. The quality of the pain is described as sharp. The pain does not radiate. Pertinent negatives include no dizziness, no fever, no headaches, no nausea, no palpitations, no shortness of breath and no vomiting. She has tried nothing for the symptoms. The treatment provided no relief.  Her past medical history is significant for hypertension.  Pertinent negatives for past medical history include no diabetes and no DVT.    Past Medical History:  Diagnosis Date  . Hypertension     There are no active problems to display for this patient.   Past  Surgical History:  Procedure Laterality Date  . CESAREAN SECTION    . TUBAL LIGATION      OB History    Gravida Para Term Preterm AB Living   1             SAB TAB Ectopic Multiple Live Births                   Home Medications    Prior to Admission medications   Medication Sig Start Date End Date Taking? Authorizing Provider  FUROSEMIDE PO Take by mouth as needed.   Yes Historical Provider, MD  acetaminophen (TYLENOL) 500 MG tablet Take 500 mg by mouth 2 (two) times daily as needed for pain. For pain    Historical Provider, MD  amLODipine (NORVASC) 10 MG tablet Take 10 mg by mouth daily.    Historical Provider, MD  cephALEXin (KEFLEX) 500 MG capsule Take 1 capsule (500 mg total) by mouth 4 (four) times daily. 10/23/12   Kaitlyn Szekalski, PA-C  doxycycline (VIBRAMYCIN) 100 MG capsule Take 1 capsule (100 mg total) by mouth 2 (two) times daily. One po bid x 14 days 01/16/14   Elwin Mocha, MD  hydrochlorothiazide (HYDRODIURIL) 25 MG tablet Take 12.5 mg by mouth daily.  07/22/12   Elson Areas, PA-C  hydrochlorothiazide (HYDRODIURIL) 25 MG tablet Take 1 tablet (25 mg total) by mouth daily. 04/26/13   Rolland Porter, MD  HYDROcodone-acetaminophen (NORCO/VICODIN) 5-325 MG per tablet Take 2 tablets by mouth every 4 (four)  hours as needed for pain. 08/20/12   Blane Ohara, MD  hydroxypropyl methylcellulose (ISOPTO TEARS) 2.5 % ophthalmic solution Place 1 drop into both eyes 4 (four) times daily as needed for dry eyes. 12/04/12   Bobbye Charleston, MD  ibuprofen (ADVIL,MOTRIN) 800 MG tablet Take 800 mg by mouth every 8 (eight) hours as needed for pain.    Historical Provider, MD  metoprolol tartrate (LOPRESSOR) 25 MG tablet Take 25 mg by mouth 2 (two) times daily.    Historical Provider, MD    Family History History reviewed. No pertinent family history.  Social History Social History  Substance Use Topics  . Smoking status: Never Smoker  . Smokeless tobacco: Never Used  . Alcohol use  No     Allergies   Patient has no known allergies.   Review of Systems Review of Systems  Constitutional: Negative for chills and fever.  HENT: Negative for congestion and rhinorrhea.   Eyes: Negative for redness and visual disturbance.  Respiratory: Negative for shortness of breath and wheezing.   Cardiovascular: Positive for chest pain (musculoskeletal ). Negative for palpitations and leg swelling.  Gastrointestinal: Negative for nausea and vomiting.  Genitourinary: Negative for dysuria and urgency.  Musculoskeletal: Negative for arthralgias and myalgias.  Skin: Negative for pallor and wound.  Neurological: Negative for dizziness and headaches.    Physical Exam Updated Vital Signs BP (!) 186/122 (BP Location: Right Arm)   Pulse 79   Temp 97.6 F (36.4 C) (Oral)   Resp 18   Ht  (1.676 m)   Wt 212 lb (96.2 kg)   LMP 05/04/2016   SpO2 100%   Breastfeeding? Unknown   BMI 34.22 kg/m   Physical Exam  Constitutional: She is oriented to person, place, and time. She appears well-developed and well-nourished. No distress.  HENT:  Head: Normocephalic and atraumatic.  Eyes: EOM are normal. Pupils are equal, round, and reactive to light.  Neck: Normal range of motion. Neck supple.  Cardiovascular: Normal rate and regular rhythm.  Exam reveals no gallop and no friction rub.   No murmur heard. Pulmonary/Chest: Effort normal. She has no wheezes. She has no rales. She exhibits tenderness.  Pinpoint tenderness a fingerbreadth away from costal margin ribs 3-4. Pain reproduced with palpation.  Abdominal: Soft. She exhibits no distension. There is no tenderness.  Musculoskeletal: She exhibits no edema or tenderness.  Neurological: She is alert and oriented to person, place, and time.  Skin: Skin is warm and dry. She is not diaphoretic.  Psychiatric: She has a normal mood and affect. Her behavior is normal.  Nursing note and vitals reviewed.    ED Treatments / Results    DIAGNOSTIC STUDIES:  Oxygen Saturation is 100% on RA, normal by my interpretation.    COORDINATION OF CARE:  4:29 PM Discussed treatment plan with pt at bedside and pt agreed to plan.  Labs (all labs ordered are listed, but only abnormal results are displayed) Labs Reviewed - No data to display  EKG  EKG Interpretation None       Radiology Dg Chest 2 View  Result Date: 05/08/2016 CLINICAL DATA:  Chest pain since this morning.  Left arm numbness. EXAM: CHEST  2 VIEW COMPARISON:  02/02/2016 FINDINGS: The heart size and mediastinal contours are within normal limits. Both lungs are clear. The visualized skeletal structures are unremarkable. IMPRESSION: No active cardiopulmonary disease. Electronically Signed   By: Kennith Center M.D.   On: 05/08/2016 16:36    Procedures  Procedures (including critical care time)  Medications Ordered in ED Medications  acetaminophen (TYLENOL) tablet 1,000 mg (1,000 mg Oral Given 05/08/16 1637)  ibuprofen (ADVIL,MOTRIN) tablet 800 mg (800 mg Oral Given 05/08/16 1638)  ondansetron (ZOFRAN-ODT) disintegrating tablet 4 mg (4 mg Oral Given 05/08/16 1645)     Initial Impression / Assessment and Plan / ED Course  I have reviewed the triage vital signs and the nursing notes.  Pertinent labs & imaging results that were available during my care of the patient were reviewed by me and considered in my medical decision making (see chart for details).     36 yo F With a chief complaint of left-sided chest pain. This is reproducible on exam. Patient has an EKG and a chest x-ray that are unremarkable. Discharge home.  Final Clinical Impressions(s) / ED Diagnoses   Final diagnoses:  Chest wall pain    New Prescriptions Discharge Medication List as of 05/08/2016  4:49 PM    I personally performed the services described in this documentation, which was scribed in my presence. The recorded information has been reviewed and is accurate.      Melene Plan,  DO 05/08/16 1735

## 2019-05-04 ENCOUNTER — Emergency Department (HOSPITAL_COMMUNITY)
Admission: EM | Admit: 2019-05-04 | Discharge: 2019-05-05 | Discharge status: Against Medical Advice | Payer: Medicaid Other | Source: Home / Self Care

## 2019-05-04 ENCOUNTER — Other Ambulatory Visit: Payer: Self-pay

## 2019-05-04 DIAGNOSIS — Z5321 Procedure and treatment not carried out due to patient leaving prior to being seen by health care provider: Secondary | ICD-10-CM | POA: Insufficient documentation

## 2019-05-04 DIAGNOSIS — R0602 Shortness of breath: Secondary | ICD-10-CM | POA: Insufficient documentation

## 2019-05-04 MED ORDER — ALBUTEROL SULFATE HFA 108 (90 BASE) MCG/ACT IN AERS
4.0000 | INHALATION_SPRAY | Freq: Once | RESPIRATORY_TRACT | Status: AC
  Administered 2019-05-04: 22:00:00 4 via RESPIRATORY_TRACT
  Filled 2019-05-04: qty 6.7

## 2019-05-04 MED ORDER — ACETAMINOPHEN 325 MG PO TABS
650.0000 mg | ORAL_TABLET | Freq: Once | ORAL | Status: AC
  Administered 2019-05-04: 22:00:00 650 mg via ORAL
  Filled 2019-05-04: qty 2

## 2019-05-04 NOTE — ED Triage Notes (Signed)
Pt states she was seen at [facility] last night and dx with "Covid pneumonia". States that they didn't "do anything" for her and that seh is very SOB.

## 2019-05-05 ENCOUNTER — Emergency Department (HOSPITAL_BASED_OUTPATIENT_CLINIC_OR_DEPARTMENT_OTHER): Payer: Medicaid Other

## 2019-05-05 ENCOUNTER — Encounter (HOSPITAL_BASED_OUTPATIENT_CLINIC_OR_DEPARTMENT_OTHER): Payer: Self-pay | Admitting: *Deleted

## 2019-05-05 ENCOUNTER — Inpatient Hospital Stay (HOSPITAL_BASED_OUTPATIENT_CLINIC_OR_DEPARTMENT_OTHER)
Admission: EM | Admit: 2019-05-05 | Discharge: 2019-05-12 | DRG: 177 | Disposition: A | Discharge status: Home / Self Care | Payer: Medicaid Other | Attending: Internal Medicine | Admitting: Internal Medicine

## 2019-05-05 DIAGNOSIS — N179 Acute kidney failure, unspecified: Secondary | ICD-10-CM | POA: Diagnosis present

## 2019-05-05 DIAGNOSIS — E669 Obesity, unspecified: Secondary | ICD-10-CM | POA: Diagnosis present

## 2019-05-05 DIAGNOSIS — Z79899 Other long term (current) drug therapy: Secondary | ICD-10-CM

## 2019-05-05 DIAGNOSIS — J9601 Acute respiratory failure with hypoxia: Secondary | ICD-10-CM | POA: Diagnosis present

## 2019-05-05 DIAGNOSIS — U071 COVID-19: Principal | ICD-10-CM | POA: Diagnosis present

## 2019-05-05 DIAGNOSIS — N1832 Chronic kidney disease, stage 3b: Secondary | ICD-10-CM | POA: Diagnosis present

## 2019-05-05 DIAGNOSIS — R0902 Hypoxemia: Secondary | ICD-10-CM | POA: Diagnosis not present

## 2019-05-05 DIAGNOSIS — I129 Hypertensive chronic kidney disease with stage 1 through stage 4 chronic kidney disease, or unspecified chronic kidney disease: Secondary | ICD-10-CM | POA: Diagnosis present

## 2019-05-05 DIAGNOSIS — E860 Dehydration: Secondary | ICD-10-CM | POA: Diagnosis present

## 2019-05-05 DIAGNOSIS — I252 Old myocardial infarction: Secondary | ICD-10-CM | POA: Diagnosis not present

## 2019-05-05 DIAGNOSIS — J1282 Pneumonia due to coronavirus disease 2019: Secondary | ICD-10-CM | POA: Diagnosis present

## 2019-05-05 DIAGNOSIS — R0602 Shortness of breath: Secondary | ICD-10-CM | POA: Diagnosis present

## 2019-05-05 DIAGNOSIS — E876 Hypokalemia: Secondary | ICD-10-CM | POA: Diagnosis present

## 2019-05-05 DIAGNOSIS — Z955 Presence of coronary angioplasty implant and graft: Secondary | ICD-10-CM | POA: Diagnosis not present

## 2019-05-05 DIAGNOSIS — I10 Essential (primary) hypertension: Secondary | ICD-10-CM | POA: Diagnosis not present

## 2019-05-05 DIAGNOSIS — I2510 Atherosclerotic heart disease of native coronary artery without angina pectoris: Secondary | ICD-10-CM | POA: Diagnosis present

## 2019-05-05 DIAGNOSIS — J189 Pneumonia, unspecified organism: Secondary | ICD-10-CM

## 2019-05-05 DIAGNOSIS — Z6836 Body mass index (BMI) 36.0-36.9, adult: Secondary | ICD-10-CM

## 2019-05-05 DIAGNOSIS — R0682 Tachypnea, not elsewhere classified: Secondary | ICD-10-CM

## 2019-05-05 HISTORY — DX: Atherosclerotic heart disease of native coronary artery without angina pectoris: I25.10

## 2019-05-05 LAB — COMPREHENSIVE METABOLIC PANEL
ALT: 65 U/L — ABNORMAL HIGH (ref 0–44)
AST: 53 U/L — ABNORMAL HIGH (ref 15–41)
Albumin: 3.7 g/dL (ref 3.5–5.0)
Alkaline Phosphatase: 85 U/L (ref 38–126)
Anion gap: 10 (ref 5–15)
BUN: 12 mg/dL (ref 6–20)
CO2: 22 mmol/L (ref 22–32)
Calcium: 8.4 mg/dL — ABNORMAL LOW (ref 8.9–10.3)
Chloride: 103 mmol/L (ref 98–111)
Creatinine, Ser: 1.42 mg/dL — ABNORMAL HIGH (ref 0.44–1.00)
GFR calc Af Amer: 53 mL/min — ABNORMAL LOW (ref >60)
GFR calc non Af Amer: 46 mL/min — ABNORMAL LOW (ref >60)
Glucose, Bld: 128 mg/dL — ABNORMAL HIGH (ref 70–99)
Potassium: 3.3 mmol/L — ABNORMAL LOW (ref 3.5–5.1)
Sodium: 135 mmol/L (ref 135–145)
Total Bilirubin: 0.6 mg/dL (ref 0.3–1.2)
Total Protein: 7 g/dL (ref 6.5–8.1)

## 2019-05-05 LAB — LACTIC ACID, PLASMA: Lactic Acid, Venous: 1 mmol/L (ref 0.5–1.9)

## 2019-05-05 LAB — CBC WITH DIFFERENTIAL/PLATELET
Abs Immature Granulocytes: 0.02 10*3/uL (ref 0.00–0.07)
Basophils Absolute: 0 10*3/uL (ref 0.0–0.1)
Basophils Relative: 1 %
Eosinophils Absolute: 0 10*3/uL (ref 0.0–0.5)
Eosinophils Relative: 0 %
HCT: 42.8 % (ref 36.0–46.0)
Hemoglobin: 14.2 g/dL (ref 12.0–15.0)
Immature Granulocytes: 1 %
Lymphocytes Relative: 38 %
Lymphs Abs: 1.7 10*3/uL (ref 0.7–4.0)
MCH: 29 pg (ref 26.0–34.0)
MCHC: 33.2 g/dL (ref 30.0–36.0)
MCV: 87.5 fL (ref 80.0–100.0)
Monocytes Absolute: 0.2 10*3/uL (ref 0.1–1.0)
Monocytes Relative: 6 %
Neutro Abs: 2.4 10*3/uL (ref 1.7–7.7)
Neutrophils Relative %: 54 %
Platelets: 224 10*3/uL (ref 150–400)
RBC: 4.89 MIL/uL (ref 3.87–5.11)
RDW: 14.1 % (ref 11.5–15.5)
WBC: 4.3 10*3/uL (ref 4.0–10.5)
nRBC: 0 % (ref 0.0–0.2)

## 2019-05-05 LAB — TROPONIN I (HIGH SENSITIVITY)
Troponin I (High Sensitivity): 9 ng/L (ref <18)
Troponin I (High Sensitivity): 10 ng/L (ref <18)

## 2019-05-05 LAB — TRIGLYCERIDES: Triglycerides: 140 mg/dL (ref <150)

## 2019-05-05 LAB — PREGNANCY, URINE: Preg Test, Ur: NEGATIVE

## 2019-05-05 LAB — C-REACTIVE PROTEIN: CRP: 6.2 mg/dL — ABNORMAL HIGH (ref <1.0)

## 2019-05-05 LAB — PROCALCITONIN: Procalcitonin: <0.1 ng/mL

## 2019-05-05 LAB — FERRITIN: Ferritin: 91 ng/mL (ref 11–307)

## 2019-05-05 LAB — LACTATE DEHYDROGENASE: LDH: 306 U/L — ABNORMAL HIGH (ref 98–192)

## 2019-05-05 LAB — FIBRINOGEN: Fibrinogen: 592 mg/dL — ABNORMAL HIGH (ref 210–475)

## 2019-05-05 LAB — SARS CORONAVIRUS 2 AG (30 MIN TAT): SARS Coronavirus 2 Ag: POSITIVE — AB

## 2019-05-05 LAB — D-DIMER, QUANTITATIVE: D-Dimer, Quant: <0.27 ug/mL-FEU (ref 0.00–0.50)

## 2019-05-05 MED ORDER — DEXAMETHASONE SODIUM PHOSPHATE 10 MG/ML IJ SOLN
10.0000 mg | Freq: Once | INTRAMUSCULAR | Status: AC
  Administered 2019-05-05: 10 mg via INTRAVENOUS
  Filled 2019-05-05: qty 1

## 2019-05-05 MED ORDER — ASPIRIN EC 81 MG PO TBEC
81.0000 mg | DELAYED_RELEASE_TABLET | Freq: Every day | ORAL | Status: DC
  Administered 2019-05-05 – 2019-05-12 (×8): 81 mg via ORAL
  Filled 2019-05-05 (×8): qty 1

## 2019-05-05 MED ORDER — SODIUM CHLORIDE 0.9 % IV SOLN
100.0000 mg | Freq: Every day | INTRAVENOUS | Status: AC
  Administered 2019-05-06 – 2019-05-09 (×4): 100 mg via INTRAVENOUS
  Filled 2019-05-05 (×4): qty 20

## 2019-05-05 MED ORDER — HYDRALAZINE HCL 25 MG PO TABS: 25.0000 mg | ORAL_TABLET | Freq: Four times a day (QID) | ORAL | Status: DC | PRN

## 2019-05-05 MED ORDER — SODIUM CHLORIDE 0.9 % IV SOLN
100.0000 mg | INTRAVENOUS | Status: AC
  Administered 2019-05-05 (×2): 100 mg via INTRAVENOUS
  Filled 2019-05-05 (×2): qty 20

## 2019-05-05 MED ORDER — ACETAMINOPHEN 325 MG PO TABS
650.0000 mg | ORAL_TABLET | Freq: Four times a day (QID) | ORAL | Status: DC | PRN
  Administered 2019-05-05: 650 mg via ORAL
  Filled 2019-05-05: qty 2

## 2019-05-05 MED ORDER — LAMOTRIGINE 100 MG PO TABS
100.0000 mg | ORAL_TABLET | Freq: Every day | ORAL | Status: DC
  Administered 2019-05-05 – 2019-05-12 (×8): 100 mg via ORAL
  Filled 2019-05-05 (×8): qty 1

## 2019-05-05 MED ORDER — SODIUM CHLORIDE 0.9 % IV SOLN: 200.0000 mg | Freq: Once | INTRAVENOUS | Status: DC

## 2019-05-05 MED ORDER — HEPARIN SODIUM (PORCINE) 5000 UNIT/ML IJ SOLN
5000.0000 [IU] | Freq: Three times a day (TID) | INTRAMUSCULAR | Status: DC
  Administered 2019-05-05 – 2019-05-12 (×21): 5000 [IU] via SUBCUTANEOUS
  Filled 2019-05-05 (×21): qty 1

## 2019-05-05 MED ORDER — ASCORBIC ACID 500 MG PO TABS
1000.0000 mg | ORAL_TABLET | Freq: Two times a day (BID) | ORAL | Status: DC
  Administered 2019-05-05 – 2019-05-12 (×14): 1000 mg via ORAL
  Filled 2019-05-05 (×14): qty 2

## 2019-05-05 MED ORDER — GUAIFENESIN-DM 100-10 MG/5ML PO SYRP
10.0000 mL | ORAL_SOLUTION | ORAL | Status: DC | PRN
  Administered 2019-05-05 – 2019-05-11 (×11): 10 mL via ORAL
  Filled 2019-05-05 (×13): qty 10

## 2019-05-05 MED ORDER — SODIUM CHLORIDE 0.9 % IV SOLN: 100.0000 mg | Freq: Every day | INTRAVENOUS | Status: DC

## 2019-05-05 MED ORDER — SERTRALINE HCL 50 MG PO TABS
50.0000 mg | ORAL_TABLET | Freq: Every day | ORAL | Status: DC
  Administered 2019-05-05 – 2019-05-12 (×8): 50 mg via ORAL
  Filled 2019-05-05 (×8): qty 1

## 2019-05-05 MED ORDER — POLYVINYL ALCOHOL 1.4 % OP SOLN: 1.0000 [drp] | Freq: Four times a day (QID) | OPHTHALMIC | Status: DC | PRN

## 2019-05-05 MED ORDER — POTASSIUM CHLORIDE CRYS ER 20 MEQ PO TBCR
20.0000 meq | EXTENDED_RELEASE_TABLET | Freq: Once | ORAL | Status: AC
  Administered 2019-05-05: 20 meq via ORAL
  Filled 2019-05-05: qty 1

## 2019-05-05 MED ORDER — FUROSEMIDE 20 MG PO TABS: 20.0000 mg | ORAL_TABLET | Freq: Every day | ORAL | Status: DC

## 2019-05-05 MED ORDER — SODIUM CHLORIDE 0.9 % IV SOLN: INTRAVENOUS | Status: AC

## 2019-05-05 MED ORDER — ONDANSETRON HCL 4 MG/2ML IJ SOLN: 4.0000 mg | Freq: Four times a day (QID) | INTRAMUSCULAR | Status: DC | PRN

## 2019-05-05 MED ORDER — ONDANSETRON HCL 4 MG PO TABS: 4.0000 mg | ORAL_TABLET | Freq: Four times a day (QID) | ORAL | Status: DC | PRN

## 2019-05-05 MED ORDER — ALBUTEROL SULFATE HFA 108 (90 BASE) MCG/ACT IN AERS
2.0000 | INHALATION_SPRAY | Freq: Four times a day (QID) | RESPIRATORY_TRACT | Status: DC
  Administered 2019-05-05 – 2019-05-12 (×28): 2 via RESPIRATORY_TRACT
  Filled 2019-05-05: qty 6.7

## 2019-05-05 MED ORDER — METOPROLOL TARTRATE 25 MG PO TABS: 25.0000 mg | ORAL_TABLET | Freq: Two times a day (BID) | ORAL | Status: DC

## 2019-05-05 MED ORDER — DEXAMETHASONE 6 MG PO TABS
6.0000 mg | ORAL_TABLET | ORAL | Status: DC
  Administered 2019-05-05 – 2019-05-11 (×7): 6 mg via ORAL
  Filled 2019-05-05 (×7): qty 1

## 2019-05-05 MED ORDER — ALBUTEROL SULFATE HFA 108 (90 BASE) MCG/ACT IN AERS
2.0000 | INHALATION_SPRAY | Freq: Four times a day (QID) | RESPIRATORY_TRACT | Status: DC | PRN
  Administered 2019-05-06 – 2019-05-09 (×3): 2 via RESPIRATORY_TRACT
  Filled 2019-05-05: qty 6.7

## 2019-05-05 MED ORDER — EMERGEN-C IMMUNE PLUS/VIT D PO CHEW: 1.0000 | CHEWABLE_TABLET | Freq: Two times a day (BID) | ORAL | Status: DC

## 2019-05-05 MED ORDER — METOPROLOL TARTRATE 50 MG PO TABS: 50.0000 mg | ORAL_TABLET | Freq: Two times a day (BID) | ORAL | Status: DC

## 2019-05-05 MED ORDER — VITAMIN D 25 MCG (1000 UNIT) PO TABS
1000.0000 [IU] | ORAL_TABLET | Freq: Two times a day (BID) | ORAL | Status: DC
  Administered 2019-05-05 – 2019-05-12 (×14): 1000 [IU] via ORAL
  Filled 2019-05-05 (×14): qty 1

## 2019-05-05 MED ORDER — LIP MEDEX EX OINT
TOPICAL_OINTMENT | CUTANEOUS | Status: DC | PRN
  Filled 2019-05-05: qty 7

## 2019-05-05 NOTE — ED Provider Notes (Signed)
[facility] EMERGENCY DEPARTMENT Provider Note   CSN: [id number] Arrival date & time: 05/05/19  0025     History No chief complaint on file.   Maria Bradford is a 39 y.o. female.  HPI     This is a 39 year old female with a history of coronary artery disease who presents with ongoing fevers, chills, shortness of breath, cough.  Patient reports she had onset of symptoms last Saturday.  She tested positive for Covid 19 on Monday.  She states she has had persistently worsening symptoms throughout the week.  She has been taking Tylenol and trying to stay hydrated at home.  She states that she was seen at [facility] on Saturday and "they did not do anything for me."  She did receive a liter of fluids.  She has not received any steroids.  She states that she checked her pulse ox at home and it was 80%.  She reports nausea.  No vomiting, abdominal pain.  Past Medical History:  Diagnosis Date  . Coronary artery disease   . Hypertension     There are no problems to display for this patient.   Past Surgical History:  Procedure Laterality Date  . CESAREAN SECTION    . CORONARY ANGIOPLASTY WITH STENT PLACEMENT    . TUBAL LIGATION       OB History    Gravida  1   Para      Term      Preterm      AB      Living        SAB      TAB      Ectopic      Multiple      Live Births              No family history on file.  Social History   Tobacco Use  . Smoking status: Never Smoker  . Smokeless tobacco: Never Used  Substance Use Topics  . Alcohol use: No  . Drug use: No    Home Medications Prior to Admission medications   Medication Sig Start Date End Date Taking? Authorizing Provider  acetaminophen (TYLENOL) 500 MG tablet Take 500 mg by mouth 2 (two) times daily as needed for pain. For pain    [provider]  amLODipine (NORVASC) 10 MG tablet Take 10 mg by mouth daily.    [provider]  cephALEXin (KEFLEX) 500 MG  capsule Take 1 capsule (500 mg total) by mouth 4 (four) times daily. 10/23/12   Emilia Beck, PA-C  doxycycline (VIBRAMYCIN) 100 MG capsule Take 1 capsule (100 mg total) by mouth 2 (two) times daily. One po bid x 14 days 01/16/14   Elwin Mocha, MD  FUROSEMIDE PO Take by mouth as needed.    [provider]  hydrochlorothiazide (HYDRODIURIL) 25 MG tablet Take 12.5 mg by mouth daily.  07/22/12   Elson Areas, PA-C  hydrochlorothiazide (HYDRODIURIL) 25 MG tablet Take 1 tablet (25 mg total) by mouth daily. 04/26/13   Rolland Porter, MD  HYDROcodone-acetaminophen (NORCO/VICODIN) 5-325 MG per tablet Take 2 tablets by mouth every 4 (four) hours as needed for pain. 08/20/12   Blane Ohara, MD  hydroxypropyl methylcellulose (ISOPTO TEARS) 2.5 % ophthalmic solution Place 1 drop into both eyes 4 (four) times daily as needed for dry eyes. 12/04/12   Bobbye Charleston, MD  ibuprofen (ADVIL,MOTRIN) 800 MG tablet Take 800 mg by mouth every 8 (eight) hours as needed  for pain.    [provider]  metoprolol tartrate (LOPRESSOR) 25 MG tablet Take 25 mg by mouth 2 (two) times daily.    [provider]    Allergies    Oxycodone-acetaminophen and Oxycodone  Review of Systems   Review of Systems  Constitutional: Positive for chills and fever.  Respiratory: Positive for cough and shortness of breath.   Cardiovascular: Negative for chest pain.  Gastrointestinal: Positive for nausea. Negative for abdominal pain, constipation, diarrhea and vomiting.  Genitourinary: Negative for dysuria.  All other systems reviewed and are negative.   Physical Exam Updated Vital Signs BP 106/79   Pulse (!) 113   Temp (!) 101.6 F (38.7 C) (Oral)   Resp 19   Ht 1.676 m (5\' 6" )   Wt 102.1 kg   SpO2 93%   Breastfeeding No   BMI 36.32 kg/m   Physical Exam Vitals and nursing note reviewed.  Constitutional:      Appearance: She is well-developed. She is obese. She is ill-appearing. She is  not toxic-appearing.  HENT:     Head: Normocephalic and atraumatic.     Nose: Nose normal.     Mouth/Throat:     Mouth: Mucous membranes are dry.  Eyes:     Pupils: Pupils are equal, round, and reactive to light.  Cardiovascular:     Rate and Rhythm: Regular rhythm. Tachycardia present.  Pulmonary:     Effort: Respiratory distress present.     Comments: Speaking in short sentences, coughing frequently, tachypnea noted Abdominal:     Palpations: Abdomen is soft.  Musculoskeletal:     Cervical back: Neck supple.     Right lower leg: No edema.     Left lower leg: No edema.  Skin:    General: Skin is warm and dry.  Neurological:     Mental Status: She is alert and oriented to person, place, and time.  Psychiatric:        Mood and Affect: Mood normal.     ED Results / Procedures / Treatments   Labs (all labs ordered are listed, but only abnormal results are displayed) Labs Reviewed  COMPREHENSIVE METABOLIC PANEL - Abnormal; Notable for the following components:      Result Value   Potassium 3.3 (*)    Glucose, Bld 128 (*)    Creatinine, Ser 1.42 (*)    Calcium 8.4 (*)    AST 53 (*)    ALT 65 (*)    GFR calc non Af Amer 46 (*)    GFR calc Af Amer 53 (*)    All other components within normal limits  LACTATE DEHYDROGENASE - Abnormal; Notable for the following components:   LDH 306 (*)    All other components within normal limits  FIBRINOGEN - Abnormal; Notable for the following components:   Fibrinogen 592 (*)    All other components within normal limits  C-REACTIVE PROTEIN - Abnormal; Notable for the following components:   CRP 6.2 (*)    All other components within normal limits  CULTURE, BLOOD (ROUTINE X 2)  CULTURE, BLOOD (ROUTINE X 2)  SARS CORONAVIRUS 2 AG (30 MIN TAT)  LACTIC ACID, PLASMA  CBC WITH DIFFERENTIAL/PLATELET  D-DIMER, QUANTITATIVE (NOT AT [facility])  FERRITIN  TRIGLYCERIDES  PREGNANCY, URINE  LACTIC ACID, PLASMA  PROCALCITONIN    EKG EKG  Interpretation  Date/Time:  Monday May 05 2019 01:14:56 EDT Ventricular Rate:  118 PR Interval:    QRS Duration: 93 QT Interval:  321 QTC Calculation: 450 R Axis:   10 Text Interpretation: Sinus tachycardia Confirmed by Ross Marcus ([id number]) on 05/05/2019 1:40:30 AM   Radiology DG Chest Port 1 View  Result Date: 05/05/2019 CLINICAL DATA:  COVID pneumonia EXAM: PORTABLE CHEST 1 VIEW COMPARISON:  May 03, 2019 FINDINGS: The heart size and mediastinal contours are within normal limits. Overall shallow degree of aeration. There is hazy/patchy airspace opacity seen within the right lower lung and periphery of the left lung base. No pleural effusion. No acute osseous abnormality. IMPRESSION: Slight interval worsening in the multifocal airspace opacities. Electronically Signed   By: Jonna Clark M.D.   On: 05/05/2019 01:32    Procedures Procedures (including critical care time)  Medications Ordered in ED Medications  dexamethasone (DECADRON) injection 10 mg (10 mg Intravenous Given 05/05/19 0156)    ED Course  I have reviewed the triage vital signs and the nursing notes.  Pertinent labs & imaging results that were available during my care of the patient were reviewed by me and considered in my medical decision making (see chart for details).    MDM Rules/Calculators/A&P                       Patient presents with worsening shortness of breath and hypoxia related to COVID-19.  Tested positive last Monday.  She is febrile and tachycardic on admission.  She clinically appears short of breath and has some mild respiratory distress although her O2 sats are in the low 90s.  Chest x-ray shows interval worsening.  Patient was given a liter of fluids.  Lab work obtained.  Elevated CRP, fibrinogen, LDH.  Otherwise no significant metabolic derangements.  Given reported hypoxia at home and evidence of shortness of breath with increased inflammatory markers, patient was given Decadron.  Will  order remdesivir and plan for admission as she clinically may worsen as she is 8 to 9 days into illness.  Maria Bradford was evaluated in Emergency Department on 05/05/2019 for the symptoms described in the history of present illness. She was evaluated in the context of the global COVID-19 pandemic, which necessitated consideration that the patient might be at risk for infection with the SARS-CoV-2 virus that causes COVID-19. Institutional protocols and algorithms that pertain to the evaluation of patients at risk for COVID-19 are in a state of rapid change based on information released by regulatory bodies including the CDC and federal and state organizations. These policies and algorithms were followed during the patient's care in the ED.    Final Clinical Impression(s) / ED Diagnoses Final diagnoses:  COVID-19  Hypoxia    Rx / DC Orders ED Discharge Orders    None       Arkel Cartwright, Mayer Masker, MD 05/05/19 [phone]

## 2019-05-05 NOTE — ED Notes (Signed)
Date and time results received: 05/05/19 6:14 AM (use smartphrase ".now" to insert current time)  Test: COVID Critical Value: Positive  Name of Provider Notified: Dr. Wilkie Aye and Dionne Milo primary RN  Orders Received? Or Actions Taken?: no new orders

## 2019-05-05 NOTE — ED Notes (Signed)
Pt transported via [facility] to [facility] for admission

## 2019-05-05 NOTE — H&P (Signed)
History and Physical    Maria Bradford MWU:[id number] DOB: 1980-04-10 DOA: 05/05/2019  PCP: Burnis Medin, PA-C   Patient coming from: Home  I have personally briefly reviewed patient's old medical records in [facility]  Chief Complaint: SOB  HPI: Maria Bradford is a 39 y.o. 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 She went to test positive for Covid 19 on last Monday.  Over last week, she has had persistently worsening symptoms and she contacted her PCP, who told her to take Tylenol and trying to stay hydrated at home.  Today, she states that she checked her pulse ox at home and it was 80%.  She started to have loose diarrhea this morning, but no vomiting, no abdominal pain. Several of her family members were also tested positive last week.   Review of Systems: As per HPI otherwise 10 point review of systems negative.    Past Medical History:  Diagnosis Date  . Coronary artery disease   . Hypertension     Past Surgical History:  Procedure Laterality Date  . CESAREAN SECTION    . CORONARY ANGIOPLASTY WITH STENT PLACEMENT    . TUBAL LIGATION       reports that she has never smoked. She has never used smokeless tobacco. She reports that she does not drink alcohol or use drugs.  Allergies  Allergen Reactions  . Oxycodone-Acetaminophen Nausea And Vomiting  . Oxycodone Nausea And Vomiting    HTN in family    Prior to Admission medications   Medication Sig Start Date End Date Taking? Authorizing Provider  acetaminophen (TYLENOL) 500 MG tablet Take 1,000 mg by mouth every 6 (six) hours as needed for fever or headache (pain). For pain   Yes [provider]  albuterol (VENTOLIN HFA) 108 (90 Base) MCG/ACT inhaler Inhale 2 puffs into the lungs every 6 (six) hours as needed. 04/24/19   [provider]  amLODipine (NORVASC) 10 MG  tablet Take 10 mg by mouth daily.    [provider]  carvedilol (COREG) 12.5 MG tablet Take 12.5 mg by mouth 2 (two) times daily. 04/21/19   [provider]  doxycycline (VIBRA-TABS) 100 MG tablet Take 100 mg by mouth 2 (two) times daily. 04/22/19   [provider]  furosemide (LASIX) 20 MG tablet Take 20 mg by mouth daily. 02/28/19   [provider]  hydrochlorothiazide (HYDRODIURIL) 25 MG tablet Take 12.5 mg by mouth daily.  07/22/12   Elson Areas, PA-C  hydrochlorothiazide (HYDRODIURIL) 25 MG tablet Take 1 tablet (25 mg total) by mouth daily. 04/26/13   Rolland Porter, MD  HYDROcodone-acetaminophen (NORCO/VICODIN) 5-325 MG per tablet Take 2 tablets by mouth every 4 (four) hours as needed for pain. 08/20/12   Blane Ohara, MD  hydroxypropyl methylcellulose (ISOPTO TEARS) 2.5 % ophthalmic solution Place 1 drop into both eyes 4 (four) times daily as needed for dry eyes. 12/04/12   Bobbye Charleston, MD  ibuprofen (ADVIL,MOTRIN) 800 MG tablet Take 800 mg by mouth every 8 (eight) hours as needed for pain.    [provider]  lamoTRIgine (LAMICTAL) 100 MG tablet Take 100 mg by mouth daily. 04/21/19   [provider]  lisinopril (ZESTRIL) 5 MG tablet Take 5 mg by mouth daily. 04/24/19   [provider]  metoprolol tartrate (LOPRESSOR) 25 MG tablet Take 25 mg by  mouth 2 (two) times daily.    [provider]  sertraline (ZOLOFT) 50 MG tablet Take 50 mg by mouth daily. 04/28/19   [provider]    Physical Exam: Vitals:   05/05/19 1215 05/05/19 1246 05/05/19 1330 05/05/19 1359  BP:  (!) 143/89 117/87 124/77  Pulse:  94 (!) 104 96  Resp:  (!) 22 (!) 32 16  Temp:    98.9 F (37.2 C)  TempSrc:    Oral  SpO2: 97% 96% 97% 97%  Weight:      Height:        Constitutional: NAD, calm, comfortable Vitals:   05/05/19 1215 05/05/19 1246 05/05/19 1330 05/05/19 1359  BP:  (!) 143/89 117/87 124/77  Pulse:  94 (!) 104 96   Resp:  (!) 22 (!) 32 16  Temp:    98.9 F (37.2 C)  TempSrc:    Oral  SpO2: 97% 96% 97% 97%  Weight:      Height:       Eyes: PERRL, lids and conjunctivae normal ENMT: Mucous membranes are dry. Posterior pharynx clear of any exudate or lesions.Normal dentition.  Neck: normal, supple, no masses, no thyromegaly Respiratory: Diminished breathing sound bilaterally, scattered wheezing and crackles.  Significantly increased respiratory effort, talking in broken sentences.  Positive signs of using accessory muscle.  Cardiovascular: Regular rate and rhythm, no murmurs / rubs / gallops. No extremity edema. 2+ pedal pulses. No carotid bruits.  Abdomen: no tenderness, no masses palpated. No hepatosplenomegaly. Bowel sounds positive.  Musculoskeletal: no clubbing / cyanosis. No joint deformity upper and lower extremities. Good ROM, no contractures. Normal muscle tone.  Skin: no rashes, lesions, ulcers. No induration Neurologic: CN 2-12 grossly intact. Sensation intact, DTR normal. Strength 5/5 in all 4.  Psychiatric: Normal judgment and insight. Alert and oriented x 3. Normal mood.     Labs on Admission: I have personally reviewed following labs and imaging studies  CBC: Recent Labs  Lab 05/05/19 0110  WBC 4.3  NEUTROABS 2.4  HGB 14.2  HCT 42.8  MCV 87.5  PLT 024   Basic Metabolic Panel: Recent Labs  Lab 05/05/19 0110  NA 135  K 3.3*  CL 103  CO2 22  GLUCOSE 128*  BUN 12  CREATININE 1.42*  CALCIUM 8.4*   GFR: Estimated Creatinine Clearance: 62.9 mL/min (A) (by C-G formula based on SCr of 1.42 mg/dL (H)). Liver Function Tests: Recent Labs  Lab 05/05/19 0110  AST 53*  ALT 65*  ALKPHOS 85  BILITOT 0.6  PROT 7.0  ALBUMIN 3.7   No results for input(s): LIPASE, AMYLASE in the last 168 hours. No results for input(s): AMMONIA in the last 168 hours. Coagulation Profile: No results for input(s): INR, PROTIME in the last 168 hours. Cardiac Enzymes: No results for input(s):  CKTOTAL, CKMB, CKMBINDEX, TROPONINI in the last 168 hours. BNP (last 3 results) No results for input(s): PROBNP in the last 8760 hours. HbA1C: No results for input(s): HGBA1C in the last 72 hours. CBG: No results for input(s): GLUCAP in the last 168 hours. Lipid Profile: Recent Labs    05/05/19 0110  TRIG 140   Thyroid Function Tests: No results for input(s): TSH, T4TOTAL, FREET4, T3FREE, THYROIDAB in the last 72 hours. Anemia Panel: Recent Labs    05/05/19 0110  FERRITIN 91   Urine analysis:    Component Value Date/Time   COLORURINE YELLOW 01/16/2014 0912   APPEARANCEUR CLOUDY (A) 01/16/2014 0912   LABSPEC 1.024 01/16/2014 0912  PHURINE 6.0 01/16/2014 0912   GLUCOSEU NEGATIVE 01/16/2014 0912   HGBUR NEGATIVE 01/16/2014 0912   BILIRUBINUR NEGATIVE 01/16/2014 0912   KETONESUR NEGATIVE 01/16/2014 0912   PROTEINUR 30 (A) 01/16/2014 0912   UROBILINOGEN 1.0 01/16/2014 0912   NITRITE NEGATIVE 01/16/2014 0912   LEUKOCYTESUR LARGE (A) 01/16/2014 0912    Radiological Exams on Admission: DG Chest Port 1 View  Result Date: 05/05/2019 CLINICAL DATA:  COVID pneumonia EXAM: PORTABLE CHEST 1 VIEW COMPARISON:  May 03, 2019 FINDINGS: The heart size and mediastinal contours are within normal limits. Overall shallow degree of aeration. There is hazy/patchy airspace opacity seen within the right lower lung and periphery of the left lung base. No pleural effusion. No acute osseous abnormality. IMPRESSION: Slight interval worsening in the multifocal airspace opacities. Electronically Signed   By: Jonna Clark M.D.   On: 05/05/2019 01:32    EKG: Independently reviewed.  Sinus tachycardia no acute ST-T changes  Assessment/Plan Active Problems:   Pneumonia due to COVID-19 virus   COVID-19  Acute hypoxic respite failure secondary to COVID-19 pneumonia Remdesivir and Decadron Breathing meds scheduled and as needed D-dimer negative  AKI Clinically looks dehydrated with ongoing  diarrhea Hydration for 12 hours and then reevaluate No history of CHF Recheck BMP in the morning Hold hydrochlorothiazide and ACEI  Hypokalemia Replace and recheck Hold hydrochlorothiazide  History of CAD No chest pain, EKG showed no acute ST changes Will send 2 sets of troponins  Hypertension Continue Coreg and hold HCTZ and ACEI   DVT prophylaxis: Heparin subcu Code Status: Full code Family Communication: None at bedside Disposition Plan: Likely need 2 to 3 days hospital stay found to oxygenation improved Consults called: None Admission status: Telemetry admission   Emeline General MD [facility] Pager [phone]    05/05/2019, 3:45 PM

## 2019-05-05 NOTE — ED Notes (Signed)
Pt resting comfortably.  Decreased work of breathing with oxygen at 2L.  Awaiting inpatient bed assignment.

## 2019-05-05 NOTE — ED Notes (Signed)
Pt placed on oxygen at 2L via [place].

## 2019-05-06 ENCOUNTER — Inpatient Hospital Stay (HOSPITAL_COMMUNITY): Payer: Medicaid Other

## 2019-05-06 DIAGNOSIS — R0902 Hypoxemia: Secondary | ICD-10-CM

## 2019-05-06 DIAGNOSIS — U071 COVID-19: Principal | ICD-10-CM

## 2019-05-06 LAB — MAGNESIUM: Magnesium: 2.2 mg/dL (ref 1.7–2.4)

## 2019-05-06 LAB — CBC WITH DIFFERENTIAL/PLATELET
Abs Immature Granulocytes: 0.02 10*3/uL (ref 0.00–0.07)
Basophils Absolute: 0 10*3/uL (ref 0.0–0.1)
Basophils Relative: 0 %
Eosinophils Absolute: 0 10*3/uL (ref 0.0–0.5)
Eosinophils Relative: 0 %
HCT: 41.5 % (ref 36.0–46.0)
Hemoglobin: 13.4 g/dL (ref 12.0–15.0)
Immature Granulocytes: 0 %
Lymphocytes Relative: 15 %
Lymphs Abs: 1.3 10*3/uL (ref 0.7–4.0)
MCH: 28.8 pg (ref 26.0–34.0)
MCHC: 32.3 g/dL (ref 30.0–36.0)
MCV: 89.1 fL (ref 80.0–100.0)
Monocytes Absolute: 0.4 10*3/uL (ref 0.1–1.0)
Monocytes Relative: 5 %
Neutro Abs: 7 10*3/uL (ref 1.7–7.7)
Neutrophils Relative %: 80 %
Platelets: 253 10*3/uL (ref 150–400)
RBC: 4.66 MIL/uL (ref 3.87–5.11)
RDW: 14.2 % (ref 11.5–15.5)
WBC: 8.7 10*3/uL (ref 4.0–10.5)
nRBC: 0 % (ref 0.0–0.2)

## 2019-05-06 LAB — COMPREHENSIVE METABOLIC PANEL
ALT: 51 U/L — ABNORMAL HIGH (ref 0–44)
AST: 37 U/L (ref 15–41)
Albumin: 2.8 g/dL — ABNORMAL LOW (ref 3.5–5.0)
Alkaline Phosphatase: 72 U/L (ref 38–126)
Anion gap: 8 (ref 5–15)
BUN: 17 mg/dL (ref 6–20)
CO2: 22 mmol/L (ref 22–32)
Calcium: 8.5 mg/dL — ABNORMAL LOW (ref 8.9–10.3)
Chloride: 110 mmol/L (ref 98–111)
Creatinine, Ser: 1.44 mg/dL — ABNORMAL HIGH (ref 0.44–1.00)
GFR calc Af Amer: 52 mL/min — ABNORMAL LOW (ref >60)
GFR calc non Af Amer: 45 mL/min — ABNORMAL LOW (ref >60)
Glucose, Bld: 155 mg/dL — ABNORMAL HIGH (ref 70–99)
Potassium: 4.4 mmol/L (ref 3.5–5.1)
Sodium: 140 mmol/L (ref 135–145)
Total Bilirubin: 0.4 mg/dL (ref 0.3–1.2)
Total Protein: 5.9 g/dL — ABNORMAL LOW (ref 6.5–8.1)

## 2019-05-06 LAB — D-DIMER, QUANTITATIVE: D-Dimer, Quant: 0.34 ug/mL-FEU (ref 0.00–0.50)

## 2019-05-06 LAB — HIV ANTIBODY (ROUTINE TESTING W REFLEX): HIV Screen 4th Generation wRfx: NONREACTIVE

## 2019-05-06 LAB — PHOSPHORUS: Phosphorus: 3.5 mg/dL (ref 2.5–4.6)

## 2019-05-06 LAB — FERRITIN: Ferritin: 133 ng/mL (ref 11–307)

## 2019-05-06 LAB — C-REACTIVE PROTEIN: CRP: 2.6 mg/dL — ABNORMAL HIGH (ref <1.0)

## 2019-05-06 MED ORDER — FAMOTIDINE 20 MG PO TABS
20.0000 mg | ORAL_TABLET | Freq: Two times a day (BID) | ORAL | Status: DC
  Administered 2019-05-06 – 2019-05-12 (×12): 20 mg via ORAL
  Filled 2019-05-06 (×12): qty 1

## 2019-05-06 NOTE — Progress Notes (Signed)
PROGRESS NOTE                                                                                                                                                                                                             Patient Demographics:    Maria Bradford, is a 39 y.o. female, DOB - 1980-07-02, ITG:[id number]  Admit date - 05/05/2019   Admitting Physician Lequita Halt, MD  Outpatient Primary MD for the patient is [place], [place], PA-C  LOS - 1   No chief complaint on file.      Brief Narrative    Maria Bradford is a 39 y.o. 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 She went to test positive for Covid 19 on last Monday. Over last week, she has had persistently worsening symptoms and she contacted her PCP, who told her to take Tylenol and trying to stay hydrated at home. Today, she states that she checked her pulse ox at home and it was 80%. She started to have loose diarrhea this morning, but no vomiting, no abdominal pain. Several of her family members were also tested positive last week.   Subjective:    Maria Bradford today complaining of cough, mild dyspnea and shortness of breath .   Assessment  & Plan :    Active Problems:   Pneumonia due to COVID-19 virus   COVID-19   Hypoxia   Acute hypoxic respite failure secondary to COVID-19 pneumonia -Is on 2 L nasal cannula this morning, chest x-ray significant for multifocal bilateral opacity consistent with COVID-19 for pneumonia. -Was encouraged to use incentive spirometer, flutter valve, and to get out of bed to chair. -Continue with remdesivir. -Continue with Decadron. -I have discussed with the patient, and she consented to use whenever is indicated, at this point she is on 2 L nasal cannula, with low CRP, so no need for Actemra -Continue to trend inflammatory  markers  AKI -Secondary to volume depletion likely from ongoing diarrhea, treated with IV fluids. - Hold hydrochlorothiazide and ACEI  Hypokalemia -Repleted -Continue to hold hydrochlorothiazide  History of CAD - No chest pain, EKG showed no acute ST changes -status  post stent x2 in 2018 continue with aspirin, Coreg,  resume ACEI once kidney function back to baseline  Hypertension -Pressure acceptable,continue Coreg and hold HCTZ and ACEI   COVID-19 Labs  Recent Labs    05/05/19 0110 05/06/19 0659  DDIMER <0.27 0.34  FERRITIN 91 133  LDH 306*  --   CRP 6.2* 2.6*    No results found for: SARSCOV2NAA   Code Status : Full   Disposition Plan  : Home Status is: Inpatient  Remains inpatient appropriate because:Hemodynamically unstable   Dispo: The patient is from: Home              Anticipated d/c is to: Home              Anticipated d/c date is: > 3 days              Patient currently is not medically stable to d/c.     Consults  :  None  Procedures  : None  DVT Prophylaxis  :  [place] heparin  Lab Results  Component Value Date   PLT 253 05/06/2019    Antibiotics  :    Anti-infectives (From admission, onward)   Start     Dose/Rate Route Frequency Ordered Stop   05/06/19 1000  remdesivir 100 mg in sodium chloride 0.9 % 100 mL IVPB  Status:  Discontinued     100 mg 200 mL/hr over 30 Minutes Intravenous Daily 05/05/19 0535 05/05/19 1518   05/06/19 1000  remdesivir 100 mg in sodium chloride 0.9 % 100 mL IVPB  Status:  Discontinued     100 mg 200 mL/hr over 30 Minutes Intravenous Daily 05/05/19 1510 05/05/19 1514   05/06/19 1000  remdesivir 100 mg in sodium chloride 0.9 % 100 mL IVPB     100 mg 200 mL/hr over 30 Minutes Intravenous Daily 05/05/19 1515 05/10/19 0959   05/05/19 1515  remdesivir 200 mg in sodium chloride 0.9% 250 mL IVPB  Status:  Discontinued     200 mg 580 mL/hr over 30 Minutes Intravenous Once 05/05/19 1510 05/05/19 1514   05/05/19  1000  remdesivir 100 mg in sodium chloride 0.9 % 100 mL IVPB     100 mg 200 mL/hr over 30 Minutes Intravenous Every 30 min 05/05/19 0535 05/05/19 1158        Objective:   Vitals:   05/06/19 0000 05/06/19 0157 05/06/19 0400 05/06/19 1656  BP: (!) 126/96  (!) 128/92 126/88  Pulse: (!) 104 96 93 100  Resp: (!) 21 (!) 21 (!) 22 20  Temp: 98.1 F (36.7 C)  97.8 F (36.6 C)   TempSrc: Oral  Oral   SpO2: 93% 94% 92% 92%  Weight:      Height:        Wt Readings from Last 3 Encounters:  05/05/19 102.1 kg  05/08/16 96.2 kg  01/16/14 93 kg     Intake/Output Summary (Last 24 hours) at 05/06/2019 1701 Last data filed at 05/06/2019 1502 Gross per 24 hour  Intake 781.26 ml  Output 750 ml  Net 31.26 ml     Physical Exam  Awake Alert, Oriented X 3, No new F.N deficits, Normal affect Symmetrical Chest wall movement, Good air movement bilaterally, CTAB RRR,No Gallops,Rubs or new Murmurs, No Parasternal Heave +ve B.Sounds, Abd Soft, No tenderness,  No rebound - guarding or rigidity. No Cyanosis, Clubbing or edema, No new Rash or bruise      Data Review:    CBC Recent Labs  Lab 05/05/19 0110 05/06/19 0659  WBC  4.3 8.7  HGB 14.2 13.4  HCT 42.8 41.5  PLT 224 253  MCV 87.5 89.1  MCH 29.0 28.8  MCHC 33.2 32.3  RDW 14.1 14.2  LYMPHSABS 1.7 1.3  MONOABS 0.2 0.4  EOSABS 0.0 0.0  BASOSABS 0.0 0.0    Chemistries  Recent Labs  Lab 05/05/19 0110 05/06/19 0659  NA 135 140  K 3.3* 4.4  CL 103 110  CO2 22 22  GLUCOSE 128* 155*  BUN 12 17  CREATININE 1.42* 1.44*  CALCIUM 8.4* 8.5*  MG  --  2.2  AST 53* 37  ALT 65* 51*  ALKPHOS 85 72  BILITOT 0.6 0.4   ------------------------------------------------------------------------------------------------------------------ Recent Labs    05/05/19 0110  TRIG 140    No results found for: HGBA1C ------------------------------------------------------------------------------------------------------------------ No results  for input(s): TSH, T4TOTAL, T3FREE, THYROIDAB in the last 72 hours.  Invalid input(s): FREET3 ------------------------------------------------------------------------------------------------------------------ Recent Labs    05/05/19 0110 05/06/19 0659  FERRITIN 91 133    Coagulation profile No results for input(s): INR, PROTIME in the last 168 hours.  Recent Labs    05/05/19 0110 05/06/19 0659  DDIMER <0.27 0.34    Cardiac Enzymes No results for input(s): CKMB, TROPONINI, MYOGLOBIN in the last 168 hours.  Invalid input(s): CK ------------------------------------------------------------------------------------------------------------------ No results found for: BNP  Inpatient Medications  Scheduled Meds: . albuterol  2 puff Inhalation Q6H  . vitamin C  1,000 mg Oral BID   And  . cholecalciferol  1,000 Units Oral BID  . aspirin EC  81 mg Oral Daily  . dexamethasone  6 mg Oral Q24H  . heparin  5,000 Units Subcutaneous Q8H  . lamoTRIgine  100 mg Oral Daily  . sertraline  50 mg Oral Daily   Continuous Infusions: . remdesivir 100 mg in NS 100 mL 100 mg (05/06/19 0927)   PRN Meds:.acetaminophen, albuterol, guaiFENesin-dextromethorphan, hydrALAZINE, lip balm, ondansetron **OR** ondansetron (ZOFRAN) IV, polyvinyl alcohol  Micro Results Recent Results (from the past 240 hour(s))  Blood Culture (routine x 2)     Status: None (Preliminary result)   Collection Time: 05/05/19  1:30 AM   Specimen: BLOOD  Result Value Ref Range Status   Specimen Description   Final    BLOOD RIGHT ANTECUBITAL Performed at [facility], [address] [place], [place] [address]    Special Requests   Final    BOTTLES DRAWN AEROBIC AND ANAEROBIC Blood Culture adequate volume Performed at [facility], [address] [place], [place] [address]    Culture   Final    NO GROWTH 1 DAY Performed at [facility], [place] [address] [place], [place] [address]    Report  Status PENDING  Incomplete  Blood Culture (routine x 2)     Status: None (Preliminary result)   Collection Time: 05/05/19  1:35 AM   Specimen: BLOOD  Result Value Ref Range Status   Specimen Description   Final    BLOOD LEFT ANTECUBITAL Performed at [facility], [address] [place], [place] [address]    Special Requests   Final    BOTTLES DRAWN AEROBIC AND ANAEROBIC Blood Culture adequate volume Performed at [facility], [address], [place], [place] [address]    Culture   Final    NO GROWTH 1 DAY Performed at [facility], [place] [address], [place], [place] [address]    Report Status PENDING  Incomplete  SARS Coronavirus  2 Ag (30 min TAT) - Nasal Swab (BD Veritor Kit)     Status: Abnormal   Collection Time: 05/05/19  5:17 AM   Specimen: Nasal Swab (BD Veritor Kit)  Result Value Ref Range Status   SARS Coronavirus 2 Ag POSITIVE (A) NEGATIVE Final    Comment: RESULT CALLED TO, READ BACK BY AND VERIFIED WITH: LUBECK,E AT [id number] ON [id number] BY CHERESNOWSKY,T (NOTE) SARS-CoV-2 antigen PRESENT. Positive results indicate the presence of viral antigens, but clinical correlation with patient history and other diagnostic information is necessary to determine patient infection status.  Positive results do not rule out bacterial infection or co-infection  with other viruses. False positive results are rare but can occur, and confirmatory RT-PCR testing may be appropriate in some circumstances. The expected result is Negative. Fact Sheet for Patients: PodPark.tn Fact Sheet for Providers: GiftContent.is  This test is not yet approved or cleared by the [place] FDA and  has been authorized for detection and/or diagnosis of SARS-CoV-2 by FDA under an Emergency Use Authorization (EUA).  This EUA will remain in effect (meaning this test can be used) for the duration of  the COVID-1 9 declaration under  Section 564(b)(1) of the Act, 21 U.S.C. section 360bbb-3(b)(1), unless the authorization is terminated or revoked sooner. Performed at [facility], [address] [place], [place] [address]     Radiology Reports DG CHEST PORT 1 VIEW  Result Date: 05/06/2019 CLINICAL DATA:  Cough, shortness of breath. EXAM: PORTABLE CHEST 1 VIEW COMPARISON:  May 05, 2019. FINDINGS: The heart size and mediastinal contours are within normal limits. Stable bilateral lung opacities are noted consistent with multifocal pneumonia. No pneumothorax or pleural effusion is noted. The visualized skeletal structures are unremarkable. IMPRESSION: Stable bilateral lung opacities are noted consistent with multifocal pneumonia. Electronically Signed   By: Marijo Conception M.D.   On: 05/06/2019 09:17   DG Chest Port 1 View  Result Date: 05/05/2019 CLINICAL DATA:  COVID pneumonia EXAM: PORTABLE CHEST 1 VIEW COMPARISON:  May 03, 2019 FINDINGS: The heart size and mediastinal contours are within normal limits. Overall shallow degree of aeration. There is hazy/patchy airspace opacity seen within the right lower lung and periphery of the left lung base. No pleural effusion. No acute osseous abnormality. IMPRESSION: Slight interval worsening in the multifocal airspace opacities. Electronically Signed   By: Prudencio Pair M.D.   On: 05/05/2019 01:32     Phillips Climes M.D on 05/06/2019 at 5:01 PM  Between 7am to 7pm - Pager - [phone]  After 7pm go to [email or web address] - password [facility]  [facility] -  Office  [phone]

## 2019-05-07 DIAGNOSIS — J1282 Pneumonia due to coronavirus disease 2019: Secondary | ICD-10-CM

## 2019-05-07 DIAGNOSIS — I10 Essential (primary) hypertension: Secondary | ICD-10-CM

## 2019-05-07 DIAGNOSIS — N179 Acute kidney failure, unspecified: Secondary | ICD-10-CM

## 2019-05-07 LAB — COMPREHENSIVE METABOLIC PANEL
ALT: 120 U/L — ABNORMAL HIGH (ref 0–44)
AST: 92 U/L — ABNORMAL HIGH (ref 15–41)
Albumin: 2.8 g/dL — ABNORMAL LOW (ref 3.5–5.0)
Alkaline Phosphatase: 66 U/L (ref 38–126)
Anion gap: 10 (ref 5–15)
BUN: 18 mg/dL (ref 6–20)
CO2: 20 mmol/L — ABNORMAL LOW (ref 22–32)
Calcium: 8.4 mg/dL — ABNORMAL LOW (ref 8.9–10.3)
Chloride: 108 mmol/L (ref 98–111)
Creatinine, Ser: 1.36 mg/dL — ABNORMAL HIGH (ref 0.44–1.00)
GFR calc Af Amer: 56 mL/min — ABNORMAL LOW (ref >60)
GFR calc non Af Amer: 48 mL/min — ABNORMAL LOW (ref >60)
Glucose, Bld: 157 mg/dL — ABNORMAL HIGH (ref 70–99)
Potassium: 4.1 mmol/L (ref 3.5–5.1)
Sodium: 138 mmol/L (ref 135–145)
Total Bilirubin: 0.2 mg/dL — ABNORMAL LOW (ref 0.3–1.2)
Total Protein: 5.7 g/dL — ABNORMAL LOW (ref 6.5–8.1)

## 2019-05-07 LAB — CBC WITH DIFFERENTIAL/PLATELET
Abs Immature Granulocytes: 0.03 10*3/uL (ref 0.00–0.07)
Basophils Absolute: 0 10*3/uL (ref 0.0–0.1)
Basophils Relative: 0 %
Eosinophils Absolute: 0 10*3/uL (ref 0.0–0.5)
Eosinophils Relative: 0 %
HCT: 39.3 % (ref 36.0–46.0)
Hemoglobin: 12.8 g/dL (ref 12.0–15.0)
Immature Granulocytes: 0 %
Lymphocytes Relative: 14 %
Lymphs Abs: 1.2 10*3/uL (ref 0.7–4.0)
MCH: 28.8 pg (ref 26.0–34.0)
MCHC: 32.6 g/dL (ref 30.0–36.0)
MCV: 88.3 fL (ref 80.0–100.0)
Monocytes Absolute: 0.3 10*3/uL (ref 0.1–1.0)
Monocytes Relative: 4 %
Neutro Abs: 7.1 10*3/uL (ref 1.7–7.7)
Neutrophils Relative %: 82 %
Platelets: 294 10*3/uL (ref 150–400)
RBC: 4.45 MIL/uL (ref 3.87–5.11)
RDW: 14.3 % (ref 11.5–15.5)
WBC: 8.6 10*3/uL (ref 4.0–10.5)
nRBC: 0 % (ref 0.0–0.2)

## 2019-05-07 LAB — FERRITIN: Ferritin: 197 ng/mL (ref 11–307)

## 2019-05-07 LAB — D-DIMER, QUANTITATIVE: D-Dimer, Quant: 0.35 ug/mL-FEU (ref 0.00–0.50)

## 2019-05-07 LAB — PHOSPHORUS: Phosphorus: 4.3 mg/dL (ref 2.5–4.6)

## 2019-05-07 LAB — C-REACTIVE PROTEIN: CRP: 1.3 mg/dL — ABNORMAL HIGH (ref <1.0)

## 2019-05-07 LAB — MAGNESIUM: Magnesium: 2.2 mg/dL (ref 1.7–2.4)

## 2019-05-07 MED ORDER — SODIUM CHLORIDE 0.9 % IV SOLN: INTRAVENOUS | Status: DC | PRN

## 2019-05-07 MED ORDER — CARVEDILOL 3.125 MG PO TABS
3.1250 mg | ORAL_TABLET | Freq: Two times a day (BID) | ORAL | Status: DC
  Administered 2019-05-07 – 2019-05-12 (×10): 3.125 mg via ORAL
  Filled 2019-05-07 (×11): qty 1

## 2019-05-07 NOTE — Progress Notes (Signed)
PROGRESS NOTE                                                                                                                                                                                                             Patient Demographics:    Maria Bradford, is a 39 y.o. female, DOB - 1980/02/12, BWL:[id number]  Outpatient Primary MD for the patient is Belva Bertin, [place], [place]   Admit date - 05/05/2019   LOS - 2  No chief complaint on file.      Brief Narrative: Patient is a 39 y.o. female with PMHx of s/p PCI in 2019, HTN who presented with acute hypoxic respiratory failure secondary to COVID-19 pneumonia.  Significant Events: 4/26>> admit to [facility] for hypoxemia secondary to COVID-19  COVID-19 medications: Steroids: 4/26>> Remdesivir: 4/26  Antibiotics: None  Microbiology data: 4/26: Blood culture>> negative  DVT prophylaxis: SQ heparin  Procedures: None  Consults: None    Subjective:    Maria Bradford today continues to have coughing spells-does not feel short of breath when she goes to the bedside commode.  Lying comfortably in bed.   Assessment  & Plan :   Acute Hypoxic Resp Failure due to Covid 19 Viral pneumonia: Appears stable-requiring anywhere from 3 to 5 L of oxygen overnight.  CRP has almost normalized-plans are to continue with steroids and remdesivir.    Not sure if her pulse ox is accurate-her O2 saturation does tend to fluctuate widely-she appears comfortable with downtrending CRP.  Fever: afebrile  O2 requirements:  SpO2: 90 % O2 Flow Rate (L/min): 5 L/min   COVID-19 Labs: Recent Labs    05/05/19 0110 05/06/19 0659 05/07/19 0610  DDIMER <0.27 0.34 0.35  FERRITIN 91 133 197  LDH 306*  --   --   CRP 6.2* 2.6* 1.3*    No results found for: BNP  Recent Labs  Lab 05/05/19 0110  PROCALCITON <0.10    No results found for: SARSCOV2NAA   Prone/Incentive Spirometry:  encouraged  incentive spirometry use 3-4/hour.  AKI on CKD stage IIIb: Likely hemodynamically mediated secondary to diarrhea-improving with supportive care-avoid nephrotoxic agents.  Creatinine close to usual baseline.  Transaminitis: Secondary to COVID-19-follow-if worsens we can consider further work-up.  CAD: No anginal symptoms-continue aspirin and low-dose beta-blocker  HTN: BP controlled-restarting Coreg but at a lower dose today.  Hold other antihypertensives for now-resume when able.  Obesity: Estimated body mass index is 36.69 kg/m as calculated from the following:   Height as of this encounter: _0  (1.676 m).   Weight as of this encounter: 103.1 kg.   ABG: No results found for: PHART, PCO2ART, PO2ART, HCO3, TCO2, ACIDBASEDEF, O2SAT  Vent Settings: N/A  Condition -Stable  Family Communication  : Called mother-number listed in the facesheet appears to be a wrong number, subsequently called patient's significant other-unable to leave voicemail as voicemail not set up on 4/28  Code Status :  Full Code  Diet :  Diet Order            Diet 2 gram sodium Room service appropriate? Yes; Fluid consistency: Thin  Diet effective now               Disposition Plan  :  Status is: Inpatient  Remains inpatient appropriate because:Inpatient level of care appropriate due to severity of illness  Dispo: The patient is from: Home              Anticipated d/c is to: Home              Anticipated d/c date is: 2 days              Patient currently is not medically stable to d/c.   Barriers to discharge: Hypoxia requiring O2 supplementation/complete 5 days of IV Remdesivir  Antimicorbials  :    Anti-infectives (From admission, onward)   Start     Dose/Rate Route Frequency Ordered Stop   05/06/19 1000  remdesivir 100 mg in sodium chloride 0.9 % 100 mL IVPB  Status:  Discontinued     100 mg 200 mL/hr over 30 Minutes Intravenous Daily 05/05/19 0535 05/05/19 1518   05/06/19  1000  remdesivir 100 mg in sodium chloride 0.9 % 100 mL IVPB  Status:  Discontinued     100 mg 200 mL/hr over 30 Minutes Intravenous Daily 05/05/19 1510 05/05/19 1514   05/06/19 1000  remdesivir 100 mg in sodium chloride 0.9 % 100 mL IVPB     100 mg 200 mL/hr over 30 Minutes Intravenous Daily 05/05/19 1515 05/10/19 0959   05/05/19 1515  remdesivir 200 mg in sodium chloride 0.9% 250 mL IVPB  Status:  Discontinued     200 mg 580 mL/hr over 30 Minutes Intravenous Once 05/05/19 1510 05/05/19 1514   05/05/19 1000  remdesivir 100 mg in sodium chloride 0.9 % 100 mL IVPB     100 mg 200 mL/hr over 30 Minutes Intravenous Every 30 min 05/05/19 0535 05/05/19 1158      Inpatient Medications  Scheduled Meds: . albuterol  2 puff Inhalation Q6H  . vitamin C  1,000 mg Oral BID   And  . cholecalciferol  1,000 Units Oral BID  . aspirin EC  81 mg Oral Daily  . dexamethasone  6 mg Oral Q24H  . famotidine  20 mg Oral BID  . heparin  5,000 Units Subcutaneous Q8H  . lamoTRIgine  100 mg Oral Daily  . sertraline  50 mg Oral Daily   Continuous Infusions: . remdesivir 100 mg in NS 100 mL 100 mg (05/07/19 0844)   PRN Meds:.acetaminophen, albuterol, guaiFENesin-dextromethorphan, hydrALAZINE, lip balm, ondansetron **OR** ondansetron (ZOFRAN) IV, polyvinyl alcohol   Time Spent in minutes  25  See all Orders from today for further details   Oren Binet M.D on 05/07/2019 at 11:18 AM  To page go to  [email or web address] - use universal password  [facility] -  Office  [phone]    Objective:   Vitals:   05/06/19 2100 05/07/19 0300 05/07/19 0310 05/07/19 0500  BP:    114/84  Pulse:    91  Resp:    18  Temp:    97.9 F (36.6 C)  TempSrc:    Oral  SpO2: 90% (!) 82% 90% 90%  Weight:    103.1 kg  Height:        Wt Readings from Last 3 Encounters:  05/07/19 103.1 kg  05/08/16 96.2 kg  01/16/14 93 kg     Intake/Output Summary (Last 24 hours) at 05/07/2019 1118 Last data filed at  05/07/2019 1009 Gross per 24 hour  Intake 580.06 ml  Output 550 ml  Net 30.06 ml     Physical Exam Gen Exam:Alert awake-not in any distress HEENT:atraumatic, normocephalic Chest: B/L clear to auscultation anteriorly CVS:S1S2 regular Abdomen:soft non tender, non distended Extremities:no edema Neurology: Non focal Skin: no rash   Data Review:    CBC Recent Labs  Lab 05/05/19 0110 05/06/19 0659 05/07/19 0610  WBC 4.3 8.7 8.6  HGB 14.2 13.4 12.8  HCT 42.8 41.5 39.3  PLT 224 253 294  MCV 87.5 89.1 88.3  MCH 29.0 28.8 28.8  MCHC 33.2 32.3 32.6  RDW 14.1 14.2 14.3  LYMPHSABS 1.7 1.3 1.2  MONOABS 0.2 0.4 0.3  EOSABS 0.0 0.0 0.0  BASOSABS 0.0 0.0 0.0    Chemistries  Recent Labs  Lab 05/05/19 0110 05/06/19 0659 05/07/19 0610  NA 135 140 138  K 3.3* 4.4 4.1  CL 103 110 108  CO2 22 22 20*  GLUCOSE 128* 155* 157*  BUN _0 CREATININE 1.42* 1.44* 1.36*  CALCIUM 8.4* 8.5* 8.4*  MG  --  2.2 2.2  AST 53* 37 92*  ALT 65* 51* 120*  ALKPHOS 85 72 66  BILITOT 0.6 0.4 0.2*   ------------------------------------------------------------------------------------------------------------------ Recent Labs    05/05/19 0110  TRIG 140    No results found for: HGBA1C ------------------------------------------------------------------------------------------------------------------ No results for input(s): TSH, T4TOTAL, T3FREE, THYROIDAB in the last 72 hours.  Invalid input(s): FREET3 ------------------------------------------------------------------------------------------------------------------ Recent Labs    05/06/19 0659 05/07/19 0610  FERRITIN 133 197    Coagulation profile No results for input(s): INR, PROTIME in the last 168 hours.  Recent Labs    05/06/19 0659 05/07/19 0610  DDIMER 0.34 0.35    Cardiac Enzymes No results for input(s): CKMB, TROPONINI, MYOGLOBIN in the last 168 hours.  Invalid input(s): CK  ------------------------------------------------------------------------------------------------------------------ No results found for: BNP  Micro Results Recent Results (from the past 240 hour(s))  Blood Culture (routine x 2)     Status: None (Preliminary result)   Collection Time: 05/05/19  1:30 AM   Specimen: BLOOD  Result Value Ref Range Status   Specimen Description   Final    BLOOD RIGHT ANTECUBITAL Performed at [facility], [address] [place], [place] [address]    Special Requests   Final    BOTTLES DRAWN AEROBIC AND ANAEROBIC Blood Culture adequate volume Performed at [facility], [address] [place], [place] [address]    Culture   Final    NO GROWTH 2 DAYS Performed at [facility], [place] [address], [place], [place] [address]    Report Status PENDING  Incomplete  Blood Culture (routine x 2)     Status: None (Preliminary result)   Collection  Time: 05/05/19  1:35 AM   Specimen: BLOOD  Result Value Ref Range Status   Specimen Description   Final    BLOOD LEFT ANTECUBITAL Performed at [facility], [address] [place], [place] [address]    Special Requests   Final    BOTTLES DRAWN AEROBIC AND ANAEROBIC Blood Culture adequate volume Performed at [facility], [address] [place], [place] [address]    Culture   Final    NO GROWTH 2 DAYS Performed at [facility], [place] [address], [place], [place] [address]    Report Status PENDING  Incomplete  SARS Coronavirus 2 Ag (30 min TAT) - Nasal Swab (BD Veritor Kit)     Status: Abnormal   Collection Time: 05/05/19  5:17 AM   Specimen: Nasal Swab (BD Veritor Kit)  Result Value Ref Range Status   SARS Coronavirus 2 Ag POSITIVE (A) NEGATIVE Final    Comment: RESULT CALLED TO, READ BACK BY AND VERIFIED WITH: LUBECK,E AT 1975 ON [id number] BY CHERESNOWSKY,T (NOTE) SARS-CoV-2 antigen PRESENT. Positive results indicate the presence of viral antigens, but  clinical correlation with patient history and other diagnostic information is necessary to determine patient infection status.  Positive results do not rule out bacterial infection or co-infection  with other viruses. False positive results are rare but can occur, and confirmatory RT-PCR testing may be appropriate in some circumstances. The expected result is Negative. Fact Sheet for Patients: PodPark.tn Fact Sheet for Providers: GiftContent.is  This test is not yet approved or cleared by the [place] FDA and  has been authorized for detection and/or diagnosis of SARS-CoV-2 by FDA under an Emergency Use Authorization (EUA).  This EUA will remain in effect (meaning this test can be used) for the duration of  the COVID-1 9 declaration under Section 564(b)(1) of the Act, 21 U.S.C. section 360bbb-3(b)(1), unless the authorization is terminated or revoked sooner. Performed at [facility], [address] [place], [place] [address]     Radiology Reports DG CHEST PORT 1 VIEW  Result Date: 05/06/2019 CLINICAL DATA:  Cough, shortness of breath. EXAM: PORTABLE CHEST 1 VIEW COMPARISON:  May 05, 2019. FINDINGS: The heart size and mediastinal contours are within normal limits. Stable bilateral lung opacities are noted consistent with multifocal pneumonia. No pneumothorax or pleural effusion is noted. The visualized skeletal structures are unremarkable. IMPRESSION: Stable bilateral lung opacities are noted consistent with multifocal pneumonia. Electronically Signed   By: Marijo Conception M.D.   On: 05/06/2019 09:17   DG Chest Port 1 View  Result Date: 05/05/2019 CLINICAL DATA:  COVID pneumonia EXAM: PORTABLE CHEST 1 VIEW COMPARISON:  May 03, 2019 FINDINGS: The heart size and mediastinal contours are within normal limits. Overall shallow degree of aeration. There is hazy/patchy airspace opacity seen within the right lower lung  and periphery of the left lung base. No pleural effusion. No acute osseous abnormality. IMPRESSION: Slight interval worsening in the multifocal airspace opacities. Electronically Signed   By: Prudencio Pair M.D.   On: 05/05/2019 01:32

## 2019-05-08 ENCOUNTER — Inpatient Hospital Stay (HOSPITAL_COMMUNITY): Payer: Medicaid Other

## 2019-05-08 LAB — CBC WITH DIFFERENTIAL/PLATELET
Abs Immature Granulocytes: 0.08 10*3/uL — ABNORMAL HIGH (ref 0.00–0.07)
Basophils Absolute: 0 10*3/uL (ref 0.0–0.1)
Basophils Relative: 0 %
Eosinophils Absolute: 0 10*3/uL (ref 0.0–0.5)
Eosinophils Relative: 0 %
HCT: 40.9 % (ref 36.0–46.0)
Hemoglobin: 13.3 g/dL (ref 12.0–15.0)
Immature Granulocytes: 1 %
Lymphocytes Relative: 17 %
Lymphs Abs: 1.5 10*3/uL (ref 0.7–4.0)
MCH: 28.9 pg (ref 26.0–34.0)
MCHC: 32.5 g/dL (ref 30.0–36.0)
MCV: 88.7 fL (ref 80.0–100.0)
Monocytes Absolute: 0.6 10*3/uL (ref 0.1–1.0)
Monocytes Relative: 7 %
Neutro Abs: 6.6 10*3/uL (ref 1.7–7.7)
Neutrophils Relative %: 75 %
Platelets: 332 10*3/uL (ref 150–400)
RBC: 4.61 MIL/uL (ref 3.87–5.11)
RDW: 14.4 % (ref 11.5–15.5)
WBC: 8.7 10*3/uL (ref 4.0–10.5)
nRBC: 0 % (ref 0.0–0.2)

## 2019-05-08 LAB — COMPREHENSIVE METABOLIC PANEL
ALT: 122 U/L — ABNORMAL HIGH (ref 0–44)
AST: 60 U/L — ABNORMAL HIGH (ref 15–41)
Albumin: 2.7 g/dL — ABNORMAL LOW (ref 3.5–5.0)
Alkaline Phosphatase: 71 U/L (ref 38–126)
Anion gap: 11 (ref 5–15)
BUN: 19 mg/dL (ref 6–20)
CO2: 22 mmol/L (ref 22–32)
Calcium: 8.4 mg/dL — ABNORMAL LOW (ref 8.9–10.3)
Chloride: 106 mmol/L (ref 98–111)
Creatinine, Ser: 1.4 mg/dL — ABNORMAL HIGH (ref 0.44–1.00)
GFR calc Af Amer: 54 mL/min — ABNORMAL LOW (ref >60)
GFR calc non Af Amer: 47 mL/min — ABNORMAL LOW (ref >60)
Glucose, Bld: 158 mg/dL — ABNORMAL HIGH (ref 70–99)
Potassium: 4.1 mmol/L (ref 3.5–5.1)
Sodium: 139 mmol/L (ref 135–145)
Total Bilirubin: 0.6 mg/dL (ref 0.3–1.2)
Total Protein: 5.9 g/dL — ABNORMAL LOW (ref 6.5–8.1)

## 2019-05-08 LAB — FERRITIN: Ferritin: 158 ng/mL (ref 11–307)

## 2019-05-08 LAB — D-DIMER, QUANTITATIVE: D-Dimer, Quant: 0.38 ug/mL-FEU (ref 0.00–0.50)

## 2019-05-08 LAB — C-REACTIVE PROTEIN: CRP: 1 mg/dL — ABNORMAL HIGH (ref <1.0)

## 2019-05-08 LAB — PHOSPHORUS: Phosphorus: 4.1 mg/dL (ref 2.5–4.6)

## 2019-05-08 LAB — MAGNESIUM: Magnesium: 2.4 mg/dL (ref 1.7–2.4)

## 2019-05-08 MED ORDER — FUROSEMIDE 10 MG/ML IJ SOLN
40.0000 mg | Freq: Once | INTRAMUSCULAR | Status: AC
  Administered 2019-05-08: 40 mg via INTRAVENOUS
  Filled 2019-05-08: qty 4

## 2019-05-08 MED ORDER — ORAL CARE MOUTH RINSE
15.0000 mL | Freq: Two times a day (BID) | OROMUCOSAL | Status: DC
  Administered 2019-05-08 – 2019-05-11 (×8): 15 mL via OROMUCOSAL

## 2019-05-08 NOTE — Progress Notes (Addendum)
   05/08/19 0606  Vitals  Pulse Rate (!) 115  Oxygen Therapy  SpO2 (!) 75 %  O2 Device Nasal Cannula  O2 Flow Rate (L/min) 5 L/min  Patient Activity (if Appropriate) Other (Comment) (Patient up to [facility]  MEWS Score  MEWS Temp 0  MEWS Systolic 0  MEWS Pulse 2  MEWS RR 1  MEWS LOC 0  MEWS Score 3  MEWS Score Color Yellow   Patient mostly maintained 86-92% on O2 4.5L [place] while resting in bed. This AM O2 increased to 5L [place] d/t SpO2 85-87% on 4.5L. Patient got up to [facility]. SpO2 dropped to 70s and maintained 70s with O2 5L [place]. O2 increased to 7L [facility]. SpO2 increased to 85%. Opyd, MD notified. Patient SOB, tachypnea while resting in bed. SpO2 eventually up to 89% on O2 7L [facility]NC. Will continue to monitor.

## 2019-05-08 NOTE — Progress Notes (Signed)
PROGRESS NOTE                                                                                                                                                                                                             Patient Demographics:    Maria Bradford, is a 39 y.o. female, DOB - [birth year], ZES:[id number]  Outpatient Primary MD for the patient is Belva Bertin, [place], [place]   Admit date - 05/05/2019   LOS - 3  No chief complaint on file.      Brief Narrative: Patient is a 39 y.o. female with PMHx of s/p PCI in 2019, HTN who presented with acute hypoxic respiratory failure secondary to COVID-19 pneumonia.  Significant Events: 4/26>> admit to [facility] for hypoxemia secondary to COVID-19  COVID-19 medications: Steroids: 4/26>> Remdesivir: 4/26  Antibiotics: None  Microbiology data: 4/26: Blood culture>> negative  DVT prophylaxis: SQ heparin  Procedures: None  Consults: None    Subjective:   Patient feels better than how she presented-however still remains on 4-1/2 L of oxygen this morning.  She does get short of breath when she moves around.   Assessment  & Plan :   Acute Hypoxic Resp Failure due to Covid 19 Viral pneumonia: Continues to be hypoxic-around 4.5 L of oxygen overnight-CRP is almost normalized.  Not sure if her O2 saturation probe is reading well-have asked nurse to change the probe to her ear.  CRP has almost normalized-she does apparently take Lasix at home-we will give her 1 dose of IV Lasix.  We will continue supportive care and continue to follow closely.    Fever: afebrile  O2 requirements:  SpO2: 91 % O2 Flow Rate (L/min): 7 L/min   COVID-19 Labs: Recent Labs    05/06/19 0659 05/07/19 0610 05/08/19 0447  DDIMER 0.34 0.35 0.38  FERRITIN 133 197 158  CRP 2.6* 1.3* 1.0*    No results found for: BNP  Recent Labs  Lab 05/05/19 0110  PROCALCITON <0.10    No results found  for: SARSCOV2NAA   Prone/Incentive Spirometry: encouraged  incentive spirometry use 3-4/hour.  AKI on CKD stage IIIb: Likely hemodynamically mediated secondary to diarrhea-improving with supportive care-avoid nephrotoxic agents.  Creatinine close to usual baseline.  Transaminitis: Secondary to COVID-19-follow-if worsens we can consider further work-up.  CAD: No anginal symptoms-continue aspirin and low-dose beta-blocker  HTN: BP controlled-.  Continue to hold other antihypertensives  Obesity: Estimated body mass index is 36.69 kg/m as calculated from the following:   Height as of this encounter: _0  (1.676 m).   Weight as of this encounter: 103.1 kg.   ABG: No results found for: PHART, PCO2ART, PO2ART, HCO3, TCO2, ACIDBASEDEF, O2SAT  Vent Settings: N/A  Condition -Stable  Family Communication  : Spoke with patient's significant other on 4/29  Code Status :  Full Code  Diet :  Diet Order            Diet 2 gram sodium Room service appropriate? Yes; Fluid consistency: Thin  Diet effective now               Disposition Plan  :  Status is: Inpatient  Remains inpatient appropriate because:Inpatient level of care appropriate due to severity of illness  Dispo: The patient is from: Home              Anticipated d/c is to: Home              Anticipated d/c date is: 2 days              Patient currently is not medically stable to d/c.   Barriers to discharge: Hypoxia requiring O2 supplementation/complete 5 days of IV Remdesivir  Antimicorbials  :    Anti-infectives (From admission, onward)   Start     Dose/Rate Route Frequency Ordered Stop   05/06/19 1000  remdesivir 100 mg in sodium chloride 0.9 % 100 mL IVPB  Status:  Discontinued     100 mg 200 mL/hr over 30 Minutes Intravenous Daily 05/05/19 0535 05/05/19 1518   05/06/19 1000  remdesivir 100 mg in sodium chloride 0.9 % 100 mL IVPB  Status:  Discontinued     100 mg 200 mL/hr over 30 Minutes Intravenous Daily  05/05/19 1510 05/05/19 1514   05/06/19 1000  remdesivir 100 mg in sodium chloride 0.9 % 100 mL IVPB     100 mg 200 mL/hr over 30 Minutes Intravenous Daily 05/05/19 1515 05/10/19 0959   05/05/19 1515  remdesivir 200 mg in sodium chloride 0.9% 250 mL IVPB  Status:  Discontinued     200 mg 580 mL/hr over 30 Minutes Intravenous Once 05/05/19 1510 05/05/19 1514   05/05/19 1000  remdesivir 100 mg in sodium chloride 0.9 % 100 mL IVPB     100 mg 200 mL/hr over 30 Minutes Intravenous Every 30 min 05/05/19 0535 05/05/19 1158      Inpatient Medications  Scheduled Meds: . albuterol  2 puff Inhalation Q6H  . vitamin C  1,000 mg Oral BID   And  . cholecalciferol  1,000 Units Oral BID  . aspirin EC  81 mg Oral Daily  . carvedilol  3.125 mg Oral BID WC  . dexamethasone  6 mg Oral Q24H  . famotidine  20 mg Oral BID  . furosemide  40 mg Intravenous Once  . heparin  5,000 Units Subcutaneous Q8H  . lamoTRIgine  100 mg Oral Daily  . mouth rinse  15 mL Mouth Rinse BID  . sertraline  50 mg Oral Daily   Continuous Infusions: . sodium chloride    . remdesivir 100 mg in NS 100 mL 100 mg (05/08/19 0859)   PRN Meds:.sodium chloride, acetaminophen, albuterol, guaiFENesin-dextromethorphan, hydrALAZINE, lip balm, ondansetron **OR** ondansetron (ZOFRAN) IV, polyvinyl alcohol   Time Spent in minutes  25  See all Orders from today for  further details   Oren Binet M.D on 05/08/2019 at 11:22 AM  To page go to [email or web address] - use universal password  [facility] -  Office  [phone]    Objective:   Vitals:   05/08/19 0622 05/08/19 0625 05/08/19 0630 05/08/19 0636  BP:      Pulse: 79 78 80 76  Resp:      Temp:      TempSrc:      SpO2: (!) 87% (!) 89% 90% 91%  Weight:      Height:        Wt Readings from Last 3 Encounters:  05/07/19 103.1 kg  05/08/16 96.2 kg  01/16/14 93 kg     Intake/Output Summary (Last 24 hours) at 05/08/2019 1122 Last data filed at 05/08/2019  0600 Gross per 24 hour  Intake 600 ml  Output 1200 ml  Net -600 ml     Physical Exam Gen Exam:Alert awake-not in any distress HEENT:atraumatic, normocephalic Chest: B/L clear to auscultation anteriorly CVS:S1S2 regular Abdomen:soft non tender, non distended Extremities:no edema Neurology: Non focal Skin: no rash   Data Review:    CBC Recent Labs  Lab 05/05/19 0110 05/06/19 0659 05/07/19 0610 05/08/19 0447  WBC 4.3 8.7 8.6 8.7  HGB 14.2 13.4 12.8 13.3  HCT 42.8 41.5 39.3 40.9  PLT 224 253 294 332  MCV 87.5 89.1 88.3 88.7  MCH 29.0 28.8 28.8 28.9  MCHC 33.2 32.3 32.6 32.5  RDW 14.1 14.2 14.3 14.4  LYMPHSABS 1.7 1.3 1.2 1.5  MONOABS 0.2 0.4 0.3 0.6  EOSABS 0.0 0.0 0.0 0.0  BASOSABS 0.0 0.0 0.0 0.0    Chemistries  Recent Labs  Lab 05/05/19 0110 05/06/19 0659 05/07/19 0610 05/08/19 0447  NA 135 140 138 139  K 3.3* 4.4 4.1 4.1  CL 103 110 108 106  CO2 22 22 20* 22  GLUCOSE 128* 155* 157* 158*  BUN _0 CREATININE 1.42* 1.44* 1.36* 1.40*  CALCIUM 8.4* 8.5* 8.4* 8.4*  MG  --  2.2 2.2 2.4  AST 53* 37 92* 60*  ALT 65* 51* 120* 122*  ALKPHOS 85 72 66 71  BILITOT 0.6 0.4 0.2* 0.6   ------------------------------------------------------------------------------------------------------------------ No results for input(s): CHOL, HDL, LDLCALC, TRIG, CHOLHDL, LDLDIRECT in the last 72 hours.  No results found for: HGBA1C ------------------------------------------------------------------------------------------------------------------ No results for input(s): TSH, T4TOTAL, T3FREE, THYROIDAB in the last 72 hours.  Invalid input(s): FREET3 ------------------------------------------------------------------------------------------------------------------ Recent Labs    05/07/19 0610 05/08/19 0447  FERRITIN 197 158    Coagulation profile No results for input(s): INR, PROTIME in the last 168 hours.  Recent Labs    05/07/19 0610 05/08/19 0447  DDIMER  0.35 0.38    Cardiac Enzymes No results for input(s): CKMB, TROPONINI, MYOGLOBIN in the last 168 hours.  Invalid input(s): CK ------------------------------------------------------------------------------------------------------------------ No results found for: BNP  Micro Results Recent Results (from the past 240 hour(s))  Blood Culture (routine x 2)     Status: None (Preliminary result)   Collection Time: 05/05/19  1:30 AM   Specimen: BLOOD  Result Value Ref Range Status   Specimen Description   Final    BLOOD RIGHT ANTECUBITAL Performed at [facility], [address] [place], [place] [address]    Special Requests   Final    BOTTLES DRAWN AEROBIC AND ANAEROBIC Blood Culture adequate volume Performed at [facility], [address] [place], [place] [address]    Culture  Final    NO GROWTH 3 DAYS Performed at [facility], [place] [address] [place], [place] [address]    Report Status PENDING  Incomplete  Blood Culture (routine x 2)     Status: None (Preliminary result)   Collection Time: 05/05/19  1:35 AM   Specimen: BLOOD  Result Value Ref Range Status   Specimen Description   Final    BLOOD LEFT ANTECUBITAL Performed at [facility], [address] [place], [place] [address]    Special Requests   Final    BOTTLES DRAWN AEROBIC AND ANAEROBIC Blood Culture adequate volume Performed at [facility], [address] [place], [place] [address]    Culture   Final    NO GROWTH 3 DAYS Performed at [facility], [place] [address], [place], [place] [address]    Report Status PENDING  Incomplete  SARS Coronavirus 2 Ag (30 min TAT) - Nasal Swab (BD Veritor Kit)     Status: Abnormal   Collection Time: 05/05/19  5:17 AM   Specimen: Nasal Swab (BD Veritor Kit)  Result Value Ref Range Status   SARS Coronavirus 2 Ag POSITIVE (A) NEGATIVE Final    Comment: RESULT CALLED TO, READ BACK BY AND VERIFIED WITH: LUBECK,E AT 2751 ON  [id number] BY CHERESNOWSKY,T (NOTE) SARS-CoV-2 antigen PRESENT. Positive results indicate the presence of viral antigens, but clinical correlation with patient history and other diagnostic information is necessary to determine patient infection status.  Positive results do not rule out bacterial infection or co-infection  with other viruses. False positive results are rare but can occur, and confirmatory RT-PCR testing may be appropriate in some circumstances. The expected result is Negative. Fact Sheet for Patients: PodPark.tn Fact Sheet for Providers: GiftContent.is  This test is not yet approved or cleared by the [place] FDA and  has been authorized for detection and/or diagnosis of SARS-CoV-2 by FDA under an Emergency Use Authorization (EUA).  This EUA will remain in effect (meaning this test can be used) for the duration of  the COVID-1 9 declaration under Section 564(b)(1) of the Act, 21 U.S.C. section 360bbb-3(b)(1), unless the authorization is terminated or revoked sooner. Performed at [facility], [address] [place], [place] [address]     Radiology Reports DG CHEST PORT 1 VIEW  Result Date: 05/06/2019 CLINICAL DATA:  Cough, shortness of breath. EXAM: PORTABLE CHEST 1 VIEW COMPARISON:  May 05, 2019. FINDINGS: The heart size and mediastinal contours are within normal limits. Stable bilateral lung opacities are noted consistent with multifocal pneumonia. No pneumothorax or pleural effusion is noted. The visualized skeletal structures are unremarkable. IMPRESSION: Stable bilateral lung opacities are noted consistent with multifocal pneumonia. Electronically Signed   By: Marijo Conception M.D.   On: 05/06/2019 09:17   DG Chest Port 1 View  Result Date: 05/05/2019 CLINICAL DATA:  COVID pneumonia EXAM: PORTABLE CHEST 1 VIEW COMPARISON:  May 03, 2019 FINDINGS: The heart size and mediastinal contours are  within normal limits. Overall shallow degree of aeration. There is hazy/patchy airspace opacity seen within the right lower lung and periphery of the left lung base. No pleural effusion. No acute osseous abnormality. IMPRESSION: Slight interval worsening in the multifocal airspace opacities. Electronically Signed   By: Prudencio Pair M.D.   On: 05/05/2019 01:32   DG Chest Port 1V same Day  Result Date: 05/08/2019 CLINICAL DATA:  Shortness of breath, cough. EXAM: PORTABLE CHEST 1  VIEW COMPARISON:  May 06, 2019. FINDINGS: The heart size and mediastinal contours are within normal limits. No pneumothorax or pleural effusion is noted. Mildly increased bibasilar lung opacities are noted concerning for worsening atelectasis or pneumonia. The visualized skeletal structures are unremarkable. IMPRESSION: Mildly increased bibasilar lung opacities are noted concerning for worsening atelectasis or pneumonia. Electronically Signed   By: Marijo Conception M.D.   On: 05/08/2019 09:16

## 2019-05-08 NOTE — Plan of Care (Signed)
  Problem: Clinical Measurements: Goal: Respiratory complications will improve Outcome: Progressing   

## 2019-05-09 ENCOUNTER — Inpatient Hospital Stay (HOSPITAL_COMMUNITY): Payer: Medicaid Other

## 2019-05-09 LAB — COMPREHENSIVE METABOLIC PANEL
ALT: 90 U/L — ABNORMAL HIGH (ref 0–44)
AST: 33 U/L (ref 15–41)
Albumin: 2.7 g/dL — ABNORMAL LOW (ref 3.5–5.0)
Alkaline Phosphatase: 72 U/L (ref 38–126)
Anion gap: 12 (ref 5–15)
BUN: 22 mg/dL — ABNORMAL HIGH (ref 6–20)
CO2: 20 mmol/L — ABNORMAL LOW (ref 22–32)
Calcium: 8.4 mg/dL — ABNORMAL LOW (ref 8.9–10.3)
Chloride: 105 mmol/L (ref 98–111)
Creatinine, Ser: 1.47 mg/dL — ABNORMAL HIGH (ref 0.44–1.00)
GFR calc Af Amer: 51 mL/min — ABNORMAL LOW (ref >60)
GFR calc non Af Amer: 44 mL/min — ABNORMAL LOW (ref >60)
Glucose, Bld: 157 mg/dL — ABNORMAL HIGH (ref 70–99)
Potassium: 3.8 mmol/L (ref 3.5–5.1)
Sodium: 137 mmol/L (ref 135–145)
Total Bilirubin: 0.9 mg/dL (ref 0.3–1.2)
Total Protein: 5.7 g/dL — ABNORMAL LOW (ref 6.5–8.1)

## 2019-05-09 LAB — CBC WITH DIFFERENTIAL/PLATELET
Abs Immature Granulocytes: 0 10*3/uL (ref 0.00–0.07)
Basophils Absolute: 0 10*3/uL (ref 0.0–0.1)
Basophils Relative: 0 %
Eosinophils Absolute: 0 10*3/uL (ref 0.0–0.5)
Eosinophils Relative: 0 %
HCT: 42.1 % (ref 36.0–46.0)
Hemoglobin: 13.7 g/dL (ref 12.0–15.0)
Lymphocytes Relative: 13 %
Lymphs Abs: 1.4 10*3/uL (ref 0.7–4.0)
MCH: 28.7 pg (ref 26.0–34.0)
MCHC: 32.5 g/dL (ref 30.0–36.0)
MCV: 88.3 fL (ref 80.0–100.0)
Monocytes Absolute: 0.5 10*3/uL (ref 0.1–1.0)
Monocytes Relative: 5 %
Neutro Abs: 8.5 10*3/uL — ABNORMAL HIGH (ref 1.7–7.7)
Neutrophils Relative %: 82 %
Platelets: 336 10*3/uL (ref 150–400)
RBC: 4.77 MIL/uL (ref 3.87–5.11)
RDW: 14.1 % (ref 11.5–15.5)
WBC: 10.4 10*3/uL (ref 4.0–10.5)
nRBC: 0 /100 WBC
nRBC: 0.2 % (ref 0.0–0.2)

## 2019-05-09 LAB — FERRITIN: Ferritin: 127 ng/mL (ref 11–307)

## 2019-05-09 LAB — PROCALCITONIN: Procalcitonin: <0.1 ng/mL

## 2019-05-09 LAB — C-REACTIVE PROTEIN: CRP: 0.7 mg/dL (ref <1.0)

## 2019-05-09 LAB — D-DIMER, QUANTITATIVE: D-Dimer, Quant: <0.27 ug/mL-FEU (ref 0.00–0.50)

## 2019-05-09 MED ORDER — FUROSEMIDE 10 MG/ML IJ SOLN
40.0000 mg | Freq: Once | INTRAMUSCULAR | Status: AC
  Administered 2019-05-09: 40 mg via INTRAVENOUS
  Filled 2019-05-09: qty 4

## 2019-05-09 MED ORDER — HYDROCOD POLST-CPM POLST ER 10-8 MG/5ML PO SUER
5.0000 mL | Freq: Two times a day (BID) | ORAL | Status: DC | PRN
  Administered 2019-05-09 – 2019-05-11 (×4): 5 mL via ORAL
  Filled 2019-05-09 (×4): qty 5

## 2019-05-09 MED ORDER — BENZONATATE 100 MG PO CAPS
200.0000 mg | ORAL_CAPSULE | Freq: Three times a day (TID) | ORAL | Status: DC
  Administered 2019-05-09 – 2019-05-12 (×10): 200 mg via ORAL
  Filled 2019-05-09 (×10): qty 2

## 2019-05-09 NOTE — Progress Notes (Signed)
PROGRESS NOTE                                                                                                                                                                                                             Patient Demographics:    Maria Bradford, is a 39 y.o. female, DOB - 1980/11/13, GHW:[id number]  Outpatient Primary MD for the patient is Belva Bertin, [place], [place]   Admit date - 05/05/2019   LOS - 4  No chief complaint on file.      Brief Narrative: Patient is a 39 y.o. female with PMHx of s/p PCI in 2019, HTN who presented with acute hypoxic respiratory failure secondary to COVID-19 pneumonia.  Significant Events: 4/26>> admit to [facility] for hypoxemia secondary to COVID-19  COVID-19 medications: Steroids: 4/26>> Remdesivir: 4/26  Antibiotics: None  Microbiology data: 4/26: Blood culture>> negative  DVT prophylaxis: SQ heparin  Procedures: None  Consults: None    Subjective:   Continues to have fluctuating oxygen requirements-she is comfortable at rest.  But gets short of breath with minimal movement    Assessment  & Plan :   Acute Hypoxic Resp Failure due to Covid 19 Viral pneumonia: Continues to be hypoxic-oxygen requirements continue to fluctuate-anywhere from 4-6 L of oxygen.  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  Has trace lower extremity edema-repeat IV Lasix today.  Continue supportive care-watch closely.  Fever: afebrile  O2 requirements:  SpO2: 94 % O2 Flow Rate (L/min): 6 L/min   COVID-19 Labs: Recent Labs    05/07/19 0610 05/08/19 0447 05/09/19 0449  DDIMER 0.35 0.38 <0.27  FERRITIN 197 158 127  CRP 1.3* 1.0* 0.7    No results found for: BNP  Recent Labs  Lab 05/05/19 0110 05/09/19 0716  PROCALCITON <0.10 <0.10    No results found for:  SARSCOV2NAA   Prone/Incentive Spirometry: encouraged  incentive spirometry use 3-4/hour.  AKI on CKD stage IIIb: Likely hemodynamically mediated secondary to diarrhea-improving with supportive care-avoid nephrotoxic agents.  Creatinine close to usual baseline.  Transaminitis: Secondary to COVID-19-follow-if worsens we can consider further work-up.  CAD: No anginal symptoms-continue aspirin and low-dose beta-blocker  HTN: BP controlled.  Continue to hold other antihypertensives  Obesity: Estimated body mass index is  36.69 kg/m as calculated from the following:   Height as of this encounter: 5' 6"  (1.676 m).   Weight as of this encounter: 103.1 kg.   ABG: No results found for: PHART, PCO2ART, PO2ART, HCO3, TCO2, ACIDBASEDEF, O2SAT  Vent Settings: N/A  Condition -Stable  Family Communication  : Spoke with patient's significant other on 4/30  Code Status :  Full Code  Diet :  Diet Order            Diet 2 gram sodium Room service appropriate? Yes; Fluid consistency: Thin  Diet effective now               Disposition Plan  :  Status is: Inpatient  Remains inpatient appropriate because:Inpatient level of care appropriate due to severity of illness  Dispo: The patient is from: Home              Anticipated d/c is to: Home              Anticipated d/c date is: 2 days              Patient currently is not medically stable to d/c.   Barriers to discharge: Hypoxia requiring O2 supplementation/complete 5 days of IV Remdesivir  Antimicorbials  :    Anti-infectives (From admission, onward)   Start     Dose/Rate Route Frequency Ordered Stop   05/06/19 1000  remdesivir 100 mg in sodium chloride 0.9 % 100 mL IVPB  Status:  Discontinued     100 mg 200 mL/hr over 30 Minutes Intravenous Daily 05/05/19 0535 05/05/19 1518   05/06/19 1000  remdesivir 100 mg in sodium chloride 0.9 % 100 mL IVPB  Status:  Discontinued     100 mg 200 mL/hr over 30 Minutes Intravenous Daily 05/05/19  1510 05/05/19 1514   05/06/19 1000  remdesivir 100 mg in sodium chloride 0.9 % 100 mL IVPB     100 mg 200 mL/hr over 30 Minutes Intravenous Daily 05/05/19 1515 05/09/19 1039   05/05/19 1515  remdesivir 200 mg in sodium chloride 0.9% 250 mL IVPB  Status:  Discontinued     200 mg 580 mL/hr over 30 Minutes Intravenous Once 05/05/19 1510 05/05/19 1514   05/05/19 1000  remdesivir 100 mg in sodium chloride 0.9 % 100 mL IVPB     100 mg 200 mL/hr over 30 Minutes Intravenous Every 30 min 05/05/19 0535 05/05/19 1158      Inpatient Medications  Scheduled Meds: . albuterol  2 puff Inhalation Q6H  . vitamin C  1,000 mg Oral BID   And  . cholecalciferol  1,000 Units Oral BID  . aspirin EC  81 mg Oral Daily  . benzonatate  200 mg Oral TID  . carvedilol  3.125 mg Oral BID WC  . dexamethasone  6 mg Oral Q24H  . famotidine  20 mg Oral BID  . heparin  5,000 Units Subcutaneous Q8H  . lamoTRIgine  100 mg Oral Daily  . mouth rinse  15 mL Mouth Rinse BID  . sertraline  50 mg Oral Daily   Continuous Infusions: . sodium chloride     PRN Meds:.sodium chloride, acetaminophen, albuterol, chlorpheniramine-HYDROcodone, guaiFENesin-dextromethorphan, hydrALAZINE, lip balm, ondansetron **OR** ondansetron (ZOFRAN) IV, polyvinyl alcohol   Time Spent in minutes  25  See all Orders from today for further details   Oren Binet M.D on 05/09/2019 at 11:58 AM  To page go to [email or web address] - use universal password  [facility] -  Office  [phone]    Objective:   Vitals:   05/09/19 0356 05/09/19 0418 05/09/19 0438 05/09/19 0603  BP: 106/71     Pulse: 79 85 79 79  Resp: (!) 24 (!) 22 (!) 22 (!) 22  Temp: (P) 98.5 F (36.9 C)     TempSrc:      SpO2: (!) 87% (!) 86% 92% 94%  Weight:      Height:        Wt Readings from Last 3 Encounters:  05/07/19 103.1 kg  05/08/16 96.2 kg  01/16/14 93 kg     Intake/Output Summary (Last 24 hours) at 05/09/2019 1158 Last data filed at 05/09/2019  0414 Gross per 24 hour  Intake 600 ml  Output 1800 ml  Net -1200 ml     Physical Exam Gen Exam:Alert awake-not in any distress HEENT:atraumatic, normocephalic Chest: B/L clear to auscultation anteriorly CVS:S1S2 regular Abdomen:soft non tender, non distended Extremities:trace edema Neurology: Non focal Skin: no rash   Data Review:    CBC Recent Labs  Lab 05/05/19 0110 05/06/19 0659 05/07/19 0610 05/08/19 0447 05/09/19 0449  WBC 4.3 8.7 8.6 8.7 10.4  HGB 14.2 13.4 12.8 13.3 13.7  HCT 42.8 41.5 39.3 40.9 42.1  PLT 224 253 294 332 336  MCV 87.5 89.1 88.3 88.7 88.3  MCH 29.0 28.8 28.8 28.9 28.7  MCHC 33.2 32.3 32.6 32.5 32.5  RDW 14.1 14.2 14.3 14.4 14.1  LYMPHSABS 1.7 1.3 1.2 1.5 1.4  MONOABS 0.2 0.4 0.3 0.6 0.5  EOSABS 0.0 0.0 0.0 0.0 0.0  BASOSABS 0.0 0.0 0.0 0.0 0.0    Chemistries  Recent Labs  Lab 05/05/19 0110 05/06/19 0659 05/07/19 0610 05/08/19 0447 05/09/19 0449  NA 135 140 138 139 137  K 3.3* 4.4 4.1 4.1 3.8  CL 103 110 108 106 105  CO2 22 22 20* 22 20*  GLUCOSE 128* 155* 157* 158* 157*  BUN 12 17 18 19  22*  CREATININE 1.42* 1.44* 1.36* 1.40* 1.47*  CALCIUM 8.4* 8.5* 8.4* 8.4* 8.4*  MG  --  2.2 2.2 2.4  --   AST 53* 37 92* 60* 33  ALT 65* 51* 120* 122* 90*  ALKPHOS 85 72 66 71 72  BILITOT 0.6 0.4 0.2* 0.6 0.9   ------------------------------------------------------------------------------------------------------------------ No results for input(s): CHOL, HDL, LDLCALC, TRIG, CHOLHDL, LDLDIRECT in the last 72 hours.  No results found for: HGBA1C ------------------------------------------------------------------------------------------------------------------ No results for input(s): TSH, T4TOTAL, T3FREE, THYROIDAB in the last 72 hours.  Invalid input(s): FREET3 ------------------------------------------------------------------------------------------------------------------ Recent Labs    05/08/19 0447 05/09/19 0449  FERRITIN 158 127      Coagulation profile No results for input(s): INR, PROTIME in the last 168 hours.  Recent Labs    05/08/19 0447 05/09/19 0449  DDIMER 0.38 <0.27    Cardiac Enzymes No results for input(s): CKMB, TROPONINI, MYOGLOBIN in the last 168 hours.  Invalid input(s): CK ------------------------------------------------------------------------------------------------------------------ No results found for: BNP  Micro Results Recent Results (from the past 240 hour(s))  Blood Culture (routine x 2)     Status: None (Preliminary result)   Collection Time: 05/05/19  1:30 AM   Specimen: BLOOD  Result Value Ref Range Status   Specimen Description   Final    BLOOD RIGHT ANTECUBITAL Performed at [facility], [address] [place], [place] [address]    Special Requests   Final    BOTTLES DRAWN AEROBIC AND ANAEROBIC Blood Culture adequate volume Performed at [facility], [address] [place], [place] [address]    Culture   Final    NO GROWTH 3 DAYS Performed at [facility], [place] [address], [place], [place] [address]    Report Status PENDING  Incomplete  Blood Culture (routine x 2)     Status: None (Preliminary result)   Collection Time: 05/05/19  1:35 AM   Specimen: BLOOD  Result Value Ref Range Status   Specimen Description   Final    BLOOD LEFT ANTECUBITAL Performed at [facility], [address] [place], [place] [address]    Special Requests   Final    BOTTLES DRAWN AEROBIC AND ANAEROBIC Blood Culture adequate volume Performed at [facility], [address] [place], [place] [address]    Culture   Final    NO GROWTH 3 DAYS Performed at [facility], [place] [address], [place], [place] [address]    Report Status PENDING  Incomplete  SARS Coronavirus 2 Ag (30 min TAT) - Nasal Swab (BD Veritor Kit)     Status: Abnormal   Collection Time: 05/05/19  5:17 AM   Specimen: Nasal Swab (BD Veritor Kit)  Result Value Ref  Range Status   SARS Coronavirus 2 Ag POSITIVE (A) NEGATIVE Final    Comment: RESULT CALLED TO, READ BACK BY AND VERIFIED WITH: LUBECK,E AT [id number] ON [id number] BY CHERESNOWSKY,T (NOTE) SARS-CoV-2 antigen PRESENT. Positive results indicate the presence of viral antigens, but clinical correlation with patient history and other diagnostic information is necessary to determine patient infection status.  Positive results do not rule out bacterial infection or co-infection  with other viruses. False positive results are rare but can occur, and confirmatory RT-PCR testing may be appropriate in some circumstances. The expected result is Negative. Fact Sheet for Patients: PodPark.tn Fact Sheet for Providers: GiftContent.is  This test is not yet approved or cleared by the [place] FDA and  has been authorized for detection and/or diagnosis of SARS-CoV-2 by FDA under an Emergency Use Authorization (EUA).  This EUA will remain in effect (meaning this test can be used) for the duration of  the COVID-1 9 declaration under Section 564(b)(1) of the Act, 21 U.S.C. section 360bbb-3(b)(1), unless the authorization is terminated or revoked sooner. Performed at [facility], [address] [place], [place] [address]     Radiology Reports DG CHEST PORT 1 VIEW  Result Date: 05/09/2019 CLINICAL DATA:  COVID-19 positive. Tachycardia. EXAM: PORTABLE CHEST 1 VIEW COMPARISON:  05/08/2019 FINDINGS: Worsening streaky opacities in the right lung. Mild left lung opacities are unchanged. No pleural effusion. IMPRESSION: Worsening right and unchanged left lung opacities, likely infection. Electronically Signed   By: Ulyses Jarred M.D.   On: 05/09/2019 01:57   DG CHEST PORT 1 VIEW  Result Date: 05/06/2019 CLINICAL DATA:  Cough, shortness of breath. EXAM: PORTABLE CHEST 1 VIEW COMPARISON:  May 05, 2019. FINDINGS: The heart size and mediastinal  contours are within normal limits. Stable bilateral lung opacities are noted consistent with multifocal pneumonia. No pneumothorax or pleural effusion is noted. The visualized skeletal structures are unremarkable. IMPRESSION: Stable bilateral lung opacities are noted consistent with multifocal pneumonia. Electronically Signed   By: Marijo Conception M.D.   On: 05/06/2019 09:17   DG Chest Port 1 View  Result Date: 05/05/2019 CLINICAL DATA:  COVID pneumonia EXAM: PORTABLE CHEST 1 VIEW COMPARISON:  May 03, 2019 FINDINGS: The heart size and mediastinal contours are within normal  limits. Overall shallow degree of aeration. There is hazy/patchy airspace opacity seen within the right lower lung and periphery of the left lung base. No pleural effusion. No acute osseous abnormality. IMPRESSION: Slight interval worsening in the multifocal airspace opacities. Electronically Signed   By: Prudencio Pair M.D.   On: 05/05/2019 01:32   DG Chest Port 1V same Day  Result Date: 05/08/2019 CLINICAL DATA:  Shortness of breath, cough. EXAM: PORTABLE CHEST 1 VIEW COMPARISON:  May 06, 2019. FINDINGS: The heart size and mediastinal contours are within normal limits. No pneumothorax or pleural effusion is noted. Mildly increased bibasilar lung opacities are noted concerning for worsening atelectasis or pneumonia. The visualized skeletal structures are unremarkable. IMPRESSION: Mildly increased bibasilar lung opacities are noted concerning for worsening atelectasis or pneumonia. Electronically Signed   By: Marijo Conception M.D.   On: 05/08/2019 09:16

## 2019-05-09 NOTE — Progress Notes (Addendum)
Pt O2 sats continue in 80;s with movement. Increased O2 from 2L to 4L. MD notified and verbal okay to increase O2 to 6L if needed. Continuing to encourage deep breathing and cough.  Pt proned and O2 at 6L - pt sats low 90's and stable while proning

## 2019-05-09 NOTE — Plan of Care (Signed)
  Problem: Clinical Measurements: Goal: Respiratory complications will improve Outcome: Progressing   

## 2019-05-10 LAB — CBC WITH DIFFERENTIAL/PLATELET
Abs Immature Granulocytes: 0.42 10*3/uL — ABNORMAL HIGH (ref 0.00–0.07)
Basophils Absolute: 0 10*3/uL (ref 0.0–0.1)
Basophils Relative: 0 %
Eosinophils Absolute: 0.1 10*3/uL (ref 0.0–0.5)
Eosinophils Relative: 1 %
HCT: 42.5 % (ref 36.0–46.0)
Hemoglobin: 14.1 g/dL (ref 12.0–15.0)
Immature Granulocytes: 3 %
Lymphocytes Relative: 14 %
Lymphs Abs: 1.7 10*3/uL (ref 0.7–4.0)
MCH: 28.7 pg (ref 26.0–34.0)
MCHC: 33.2 g/dL (ref 30.0–36.0)
MCV: 86.4 fL (ref 80.0–100.0)
Monocytes Absolute: 0.9 10*3/uL (ref 0.1–1.0)
Monocytes Relative: 7 %
Neutro Abs: 9.1 10*3/uL — ABNORMAL HIGH (ref 1.7–7.7)
Neutrophils Relative %: 75 %
Platelets: 433 10*3/uL — ABNORMAL HIGH (ref 150–400)
RBC: 4.92 MIL/uL (ref 3.87–5.11)
RDW: 13.9 % (ref 11.5–15.5)
WBC: 12.3 10*3/uL — ABNORMAL HIGH (ref 4.0–10.5)
nRBC: 0.2 % (ref 0.0–0.2)

## 2019-05-10 LAB — COMPREHENSIVE METABOLIC PANEL
ALT: 81 U/L — ABNORMAL HIGH (ref 0–44)
AST: 26 U/L (ref 15–41)
Albumin: 2.5 g/dL — ABNORMAL LOW (ref 3.5–5.0)
Alkaline Phosphatase: 82 U/L (ref 38–126)
Anion gap: 11 (ref 5–15)
BUN: 20 mg/dL (ref 6–20)
CO2: 25 mmol/L (ref 22–32)
Calcium: 8.7 mg/dL — ABNORMAL LOW (ref 8.9–10.3)
Chloride: 102 mmol/L (ref 98–111)
Creatinine, Ser: 1.47 mg/dL — ABNORMAL HIGH (ref 0.44–1.00)
GFR calc Af Amer: 51 mL/min — ABNORMAL LOW (ref >60)
GFR calc non Af Amer: 44 mL/min — ABNORMAL LOW (ref >60)
Glucose, Bld: 159 mg/dL — ABNORMAL HIGH (ref 70–99)
Potassium: 4 mmol/L (ref 3.5–5.1)
Sodium: 138 mmol/L (ref 135–145)
Total Bilirubin: 0.7 mg/dL (ref 0.3–1.2)
Total Protein: 5.8 g/dL — ABNORMAL LOW (ref 6.5–8.1)

## 2019-05-10 LAB — CULTURE, BLOOD (ROUTINE X 2)
Culture: NO GROWTH
Culture: NO GROWTH
Special Requests: ADEQUATE
Special Requests: ADEQUATE

## 2019-05-10 LAB — FERRITIN: Ferritin: 109 ng/mL (ref 11–307)

## 2019-05-10 LAB — D-DIMER, QUANTITATIVE: D-Dimer, Quant: <0.27 ug/mL-FEU (ref 0.00–0.50)

## 2019-05-10 LAB — C-REACTIVE PROTEIN: CRP: 1.9 mg/dL — ABNORMAL HIGH (ref <1.0)

## 2019-05-10 LAB — BRAIN NATRIURETIC PEPTIDE: B Natriuretic Peptide: 28.9 pg/mL (ref 0.0–100.0)

## 2019-05-10 MED ORDER — DEXTROSE 50 % IV SOLN
INTRAVENOUS | Status: AC
  Filled 2019-05-10: qty 50

## 2019-05-10 NOTE — Evaluation (Signed)
Physical Therapy Evaluation Patient Details Name: Maria Bradford MRN: [id number] DOB: 26-Apr-1980 Today's Date: 05/10/2019   History of Present Illness  39 y.o. 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 Tested positive for COVID 4/19. Admitted to [facility] 05/05/19 for treatement of acute hypoxic respiratory failure secondary to COVID 19 PNA, as well as AKI.   Clinical Impression  PTA pt living with 2 teenage sons in single story home with level entry. Pt reports complete independence and was supposed to start new job this week. Pt is currently limited in safe mobility by increased oxygen demand (see General Comments) as well as decreased endurance and fatigue. Per RN pt had just finished ambulation in hallway without assist but requiring 6L O2 to maintain oxygen saturation. Pt sitting up sink washing up on entry. Pt mod I for transfers and supervision for ambulation in room. Pt will likely not need any further PT services or equipment at time of D/c. PT will continue to follow acutely to build activity tolerance.     Follow Up Recommendations No PT follow up;Supervision/Assistance - 24 hour    Equipment Recommendations  None recommended by PT       Precautions / Restrictions Precautions Precautions: None Restrictions Weight Bearing Restrictions: No      Mobility  Bed Mobility               General bed mobility comments: sitting at sink getting washed up on entry   Transfers Overall transfer level: Needs assistance Equipment used: None Transfers: Sit to/from Stand Sit to Stand: Modified independent (Device/Increase time)         General transfer comment: increased time and effort for power up from armless chair at sink and [facility]  Ambulation/Gait Ambulation/Gait assistance: Supervision Gait Distance (Feet): 15 Feet Assistive device: None Gait  Pattern/deviations: WFL(Within Functional Limits) Gait velocity: slowed Gait velocity interpretation: 1.31 - 2.62 ft/sec, indicative of limited community ambulator General Gait Details: slow, steady gait, per RN pt just finished 200 feet ambulation in hallway with no assist          Balance Overall balance assessment: Mild deficits observed, not formally tested                                           Pertinent Vitals/Pain Pain Assessment: No/denies pain    Home Living Family/patient expects to be discharged to:: Private residence Living Arrangements: Children Available Help at Discharge: Family;Available PRN/intermittently Type of Home: House Home Access: Stairs to enter   [employer] of Steps: 1 Home Layout: One level Home Equipment: None      Prior Function Level of Independence: Independent         Comments: supposed to start new job prior to getting COVID     Hand Dominance        Extremity/Trunk Assessment   Upper Extremity Assessment Upper Extremity Assessment: Overall WFL for tasks assessed    Lower Extremity Assessment Lower Extremity Assessment: Overall WFL for tasks assessed       Communication   Communication: No difficulties  Cognition Arousal/Alertness: Awake/alert Behavior During Therapy: WFL for tasks assessed/performed Overall Cognitive Status: Within Functional Limits for tasks assessed  General Comments General comments (skin integrity, edema, etc.): Pt on 3L O2 via [place] on entry with pt performing self wash up at sink, SaO2 dropping to 86%O2, rebounded with sitting back down. SaO2 dropped to 85%O2 with ambulation from sink to BSC, rebounded on sitting to 92%O2, pt left on 3L O2 via      Exercises Other Exercises Other Exercises: IS x5 max inhalation 100 mL illiciting strong cough Other Exercises: flutter valve x10    Assessment/Plan    PT  Assessment Patient needs continued PT services  PT Problem List Decreased strength;Decreased activity tolerance;Cardiopulmonary status limiting activity;Decreased mobility       PT Treatment Interventions DME instruction;Gait training;Functional mobility training;Therapeutic activities;Therapeutic exercise;Balance training;Cognitive remediation;Patient/family education    PT Goals (Current goals can be found in the Care Plan section)  Acute Rehab PT Goals Patient Stated Goal: breathe better PT Goal Formulation: With patient Time For Goal Achievement: 05/24/19 Potential to Achieve Goals: Good    Frequency Min 3X/week    AM-PAC PT "6 Clicks" Mobility  Outcome Measure Help needed turning from your back to your side while in a flat bed without using bedrails?: None Help needed moving from lying on your back to sitting on the side of a flat bed without using bedrails?: A Little Help needed moving to and from a bed to a chair (including a wheelchair)?: None Help needed standing up from a chair using your arms (e.g., wheelchair or bedside chair)?: None Help needed to walk in hospital room?: None Help needed climbing 3-5 steps with a railing? : A Little 6 Click Score: 22    End of Session Equipment Utilized During Treatment: Oxygen Activity Tolerance: Patient limited by fatigue Patient left: in chair;with call bell/phone within reach Nurse Communication: Mobility status PT Visit Diagnosis: Other abnormalities of gait and mobility (R26.89);Muscle weakness (generalized) (M62.81)    Time: 4034-7425 PT Time Calculation (min) (ACUTE ONLY): 22 min   Charges:   PT Evaluation $PT Eval Moderate Complexity: 1 Mod          Kennedy Brines B. Beverely Risen PT, DPT Acute Rehabilitation Services Pager [phone] Office [phone]   Elon Alas Fleet 05/10/2019, 12:14 PM

## 2019-05-10 NOTE — Progress Notes (Signed)
Pt ambulated 80-100 ft down hallway on 3L [place] sats around 87-89%. Increased to 5L [place] w/ O2 saturations at around 90%. Pt had some SOB, but no other complaints.

## 2019-05-10 NOTE — Progress Notes (Signed)
PROGRESS NOTE                                                                                                                                                                                                             Patient Demographics:    Maria Bradford, is a 39 y.o. female, DOB - 03-17-1980, OVP:[id number]  Outpatient Primary MD for the patient is Belva Bertin, [place], [place]   Admit date - 05/05/2019   LOS - 5  No chief complaint on file.      Brief Narrative: Patient is a 39 y.o. female with PMHx of s/p PCI in 2019, HTN who presented with acute hypoxic respiratory failure secondary to COVID-19 pneumonia.  Significant Events: 4/26>> admit to [facility] for hypoxemia secondary to COVID-19  COVID-19 medications: Steroids: 4/26>> Remdesivir: 4/26  Antibiotics: None  Microbiology data: 4/26: Blood culture>> negative  DVT prophylaxis: SQ heparin  Procedures: None  Consults: None    Subjective:   Feels somewhat better-lying comfortably in bed-no shortness of breath at rest-but does acknowledge shortness of breath when she moves around.   Assessment  & Plan :   Acute Hypoxic Resp Failure due to Covid 19 Viral pneumonia: Slowly improving-remains stable on 4 L.  Continues to have shortness of breath with minimal ambulation.  Continue supportive care-remains on steroids-has completed a course of remdesivir.  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  Her volume status is better-hold Lasix today.    Fever: afebrile  O2 requirements:  SpO2: 92 % O2 Flow Rate (L/min): 4 L/min   COVID-19 Labs: Recent Labs    05/08/19 0447 05/09/19 0449 05/10/19 0512  DDIMER 0.38 <0.27 <0.27  FERRITIN 158 127 109  CRP 1.0* 0.7 1.9*       Component Value Date/Time   BNP 28.9 05/10/2019 0512    Recent Labs  Lab 05/05/19 0110 05/09/19 0716  PROCALCITON <0.10 <0.10    No results found for: SARSCOV2NAA    Prone/Incentive Spirometry: encouraged  incentive spirometry use 3-4/hour.  AKI on CKD stage IIIb: Likely hemodynamically mediated secondary to diarrhea-improving with supportive care-avoid nephrotoxic agents.  Creatinine close to usual baseline.  Transaminitis: Secondary to COVID-19-follow-if worsens we can consider further work-up.  CAD: No anginal symptoms-continue aspirin and low-dose beta-blocker  HTN: BP controlled.  Continue to hold other antihypertensives  Obesity:  Estimated body mass index is 36.69 kg/m as calculated from the following:   Height as of this encounter: 5' 6"  (1.676 m).   Weight as of this encounter: 103.1 kg.   ABG: No results found for: PHART, PCO2ART, PO2ART, HCO3, TCO2, ACIDBASEDEF, O2SAT  Vent Settings: N/A  Condition -Stable  Family Communication  : Spoke with daughter over the phone on 5/1  Code Status :  Full Code  Diet :  Diet Order            Diet 2 gram sodium Room service appropriate? Yes; Fluid consistency: Thin  Diet effective now               Disposition Plan  :  Status is: Inpatient  Remains inpatient appropriate because:Inpatient level of care appropriate due to severity of illness  Dispo: The patient is from: Home              Anticipated d/c is to: Home              Anticipated d/c date is: 2 days              Patient currently is not medically stable to d/c.   Barriers to discharge: Hypoxia requiring O2 supplementation  Antimicorbials  :    Anti-infectives (From admission, onward)   Start     Dose/Rate Route Frequency Ordered Stop   05/06/19 1000  remdesivir 100 mg in sodium chloride 0.9 % 100 mL IVPB  Status:  Discontinued     100 mg 200 mL/hr over 30 Minutes Intravenous Daily 05/05/19 0535 05/05/19 1518   05/06/19 1000  remdesivir 100 mg in sodium chloride 0.9 % 100 mL IVPB  Status:  Discontinued     100 mg 200 mL/hr over 30 Minutes Intravenous Daily 05/05/19 1510 05/05/19 1514   05/06/19 1000  remdesivir  100 mg in sodium chloride 0.9 % 100 mL IVPB     100 mg 200 mL/hr over 30 Minutes Intravenous Daily 05/05/19 1515 05/09/19 1812   05/05/19 1515  remdesivir 200 mg in sodium chloride 0.9% 250 mL IVPB  Status:  Discontinued     200 mg 580 mL/hr over 30 Minutes Intravenous Once 05/05/19 1510 05/05/19 1514   05/05/19 1000  remdesivir 100 mg in sodium chloride 0.9 % 100 mL IVPB     100 mg 200 mL/hr over 30 Minutes Intravenous Every 30 min 05/05/19 0535 05/05/19 1158      Inpatient Medications  Scheduled Meds: . albuterol  2 puff Inhalation Q6H  . vitamin C  1,000 mg Oral BID   And  . cholecalciferol  1,000 Units Oral BID  . aspirin EC  81 mg Oral Daily  . benzonatate  200 mg Oral TID  . carvedilol  3.125 mg Oral BID WC  . dexamethasone  6 mg Oral Q24H  . dextrose      . famotidine  20 mg Oral BID  . heparin  5,000 Units Subcutaneous Q8H  . lamoTRIgine  100 mg Oral Daily  . mouth rinse  15 mL Mouth Rinse BID  . sertraline  50 mg Oral Daily   Continuous Infusions: . sodium chloride     PRN Meds:.sodium chloride, acetaminophen, albuterol, chlorpheniramine-HYDROcodone, guaiFENesin-dextromethorphan, hydrALAZINE, lip balm, ondansetron **OR** ondansetron (ZOFRAN) IV, polyvinyl alcohol   Time Spent in minutes  25  See all Orders from today for further details   Oren Binet M.D on 05/10/2019 at 12:05 PM  To page go to [email or web address] - use  universal password  [facility] -  Office  [phone]    Objective:   Vitals:   05/09/19 1410 05/09/19 1845 05/09/19 2131 05/10/19 0552  BP: (!) 132/96  111/82 115/88  Pulse: 78 92 81 80  Resp: 18  19 19   Temp: 98.3 F (36.8 C)  98.5 F (36.9 C) 98 F (36.7 C)  TempSrc: Oral  Oral Oral  SpO2: 93% 92% 94% 92%  Weight:      Height:        Wt Readings from Last 3 Encounters:  05/07/19 103.1 kg  05/08/16 96.2 kg  01/16/14 93 kg     Intake/Output Summary (Last 24 hours) at 05/10/2019 1205 Last data filed at 05/10/2019  0900 Gross per 24 hour  Intake 240 ml  Output 1749 ml  Net -1509 ml     Physical Exam Gen Exam:Alert awake-not in any distress HEENT:atraumatic, normocephalic Chest: B/L clear to auscultation anteriorly CVS:S1S2 regular Abdomen:soft non tender, non distended Extremities:+ edema Neurology: Non focal Skin: no rash   Data Review:    CBC Recent Labs  Lab 05/06/19 0659 05/07/19 0610 05/08/19 0447 05/09/19 0449 05/10/19 0512  WBC 8.7 8.6 8.7 10.4 12.3*  HGB 13.4 12.8 13.3 13.7 14.1  HCT 41.5 39.3 40.9 42.1 42.5  PLT 253 294 332 336 433*  MCV 89.1 88.3 88.7 88.3 86.4  MCH 28.8 28.8 28.9 28.7 28.7  MCHC 32.3 32.6 32.5 32.5 33.2  RDW 14.2 14.3 14.4 14.1 13.9  LYMPHSABS 1.3 1.2 1.5 1.4 1.7  MONOABS 0.4 0.3 0.6 0.5 0.9  EOSABS 0.0 0.0 0.0 0.0 0.1  BASOSABS 0.0 0.0 0.0 0.0 0.0    Chemistries  Recent Labs  Lab 05/06/19 0659 05/07/19 0610 05/08/19 0447 05/09/19 0449 05/10/19 0512  NA 140 138 139 137 138  K 4.4 4.1 4.1 3.8 4.0  CL 110 108 106 105 102  CO2 22 20* 22 20* 25  GLUCOSE 155* 157* 158* 157* 159*  BUN 17 18 19  22* 20  CREATININE 1.44* 1.36* 1.40* 1.47* 1.47*  CALCIUM 8.5* 8.4* 8.4* 8.4* 8.7*  MG 2.2 2.2 2.4  --   --   AST 37 92* 60* 33 26  ALT 51* 120* 122* 90* 81*  ALKPHOS 72 66 71 72 82  BILITOT 0.4 0.2* 0.6 0.9 0.7   ------------------------------------------------------------------------------------------------------------------ No results for input(s): CHOL, HDL, LDLCALC, TRIG, CHOLHDL, LDLDIRECT in the last 72 hours.  No results found for: HGBA1C ------------------------------------------------------------------------------------------------------------------ No results for input(s): TSH, T4TOTAL, T3FREE, THYROIDAB in the last 72 hours.  Invalid input(s): FREET3 ------------------------------------------------------------------------------------------------------------------ Recent Labs    05/09/19 0449 05/10/19 0512  FERRITIN 127 109     Coagulation profile No results for input(s): INR, PROTIME in the last 168 hours.  Recent Labs    05/09/19 0449 05/10/19 0512  DDIMER <0.27 <0.27    Cardiac Enzymes No results for input(s): CKMB, TROPONINI, MYOGLOBIN in the last 168 hours.  Invalid input(s): CK ------------------------------------------------------------------------------------------------------------------    Component Value Date/Time   BNP 28.9 05/10/2019 1937    Micro Results Recent Results (from the past 240 hour(s))  Blood Culture (routine x 2)     Status: None   Collection Time: 05/05/19  1:30 AM   Specimen: BLOOD  Result Value Ref Range Status   Specimen Description   Final    BLOOD RIGHT ANTECUBITAL Performed at [facility], [address], [place], [place] [address]    Special Requests   Final    BOTTLES DRAWN AEROBIC  AND ANAEROBIC Blood Culture adequate volume Performed at [facility], [address] [place], [place] [address]    Culture   Final    NO GROWTH 5 DAYS Performed at [facility], [place] [address], [place], [place] [address]    Report Status 05/10/2019 FINAL  Final  Blood Culture (routine x 2)     Status: None   Collection Time: 05/05/19  1:35 AM   Specimen: BLOOD  Result Value Ref Range Status   Specimen Description   Final    BLOOD LEFT ANTECUBITAL Performed at [facility], [address] [place], [place] [address]    Special Requests   Final    BOTTLES DRAWN AEROBIC AND ANAEROBIC Blood Culture adequate volume Performed at [facility], [address] [place], [place] [address]    Culture   Final    NO GROWTH 5 DAYS Performed at [facility], [place] [address], [place], [place] [address]    Report Status 05/10/2019 FINAL  Final  SARS Coronavirus 2 Ag (30 min TAT) - Nasal Swab (BD Veritor Kit)     Status: Abnormal   Collection Time: 05/05/19  5:17 AM   Specimen: Nasal Swab (BD Veritor Kit)  Result Value Ref  Range Status   SARS Coronavirus 2 Ag POSITIVE (A) NEGATIVE Final    Comment: RESULT CALLED TO, READ BACK BY AND VERIFIED WITH: LUBECK,E AT [id number] ON [id number] BY CHERESNOWSKY,T (NOTE) SARS-CoV-2 antigen PRESENT. Positive results indicate the presence of viral antigens, but clinical correlation with patient history and other diagnostic information is necessary to determine patient infection status.  Positive results do not rule out bacterial infection or co-infection  with other viruses. False positive results are rare but can occur, and confirmatory RT-PCR testing may be appropriate in some circumstances. The expected result is Negative. Fact Sheet for Patients: PodPark.tn Fact Sheet for Providers: GiftContent.is  This test is not yet approved or cleared by the [place] FDA and  has been authorized for detection and/or diagnosis of SARS-CoV-2 by FDA under an Emergency Use Authorization (EUA).  This EUA will remain in effect (meaning this test can be used) for the duration of  the COVID-1 9 declaration under Section 564(b)(1) of the Act, 21 U.S.C. section 360bbb-3(b)(1), unless the authorization is terminated or revoked sooner. Performed at [facility], [address], [place], [place] [address]     Radiology Reports DG CHEST PORT 1 VIEW  Result Date: 05/09/2019 CLINICAL DATA:  COVID-19 positive. Tachycardia. EXAM: PORTABLE CHEST 1 VIEW COMPARISON:  05/08/2019 FINDINGS: Worsening streaky opacities in the right lung. Mild left lung opacities are unchanged. No pleural effusion. IMPRESSION: Worsening right and unchanged left lung opacities, likely infection. Electronically Signed   By: Ulyses Jarred M.D.   On: 05/09/2019 01:57   DG CHEST PORT 1 VIEW  Result Date: 05/06/2019 CLINICAL DATA:  Cough, shortness of breath. EXAM: PORTABLE CHEST 1 VIEW COMPARISON:  May 05, 2019. FINDINGS: The heart size and mediastinal  contours are within normal limits. Stable bilateral lung opacities are noted consistent with multifocal pneumonia. No pneumothorax or pleural effusion is noted. The visualized skeletal structures are unremarkable. IMPRESSION: Stable bilateral lung opacities are noted consistent with multifocal pneumonia. Electronically Signed   By: Marijo Conception M.D.   On: 05/06/2019 09:17   DG Chest Port 1 View  Result Date: 05/05/2019 CLINICAL DATA:  COVID pneumonia EXAM: PORTABLE CHEST 1 VIEW  COMPARISON:  May 03, 2019 FINDINGS: The heart size and mediastinal contours are within normal limits. Overall shallow degree of aeration. There is hazy/patchy airspace opacity seen within the right lower lung and periphery of the left lung base. No pleural effusion. No acute osseous abnormality. IMPRESSION: Slight interval worsening in the multifocal airspace opacities. Electronically Signed   By: Prudencio Pair M.D.   On: 05/05/2019 01:32   DG Chest Port 1V same Day  Result Date: 05/08/2019 CLINICAL DATA:  Shortness of breath, cough. EXAM: PORTABLE CHEST 1 VIEW COMPARISON:  May 06, 2019. FINDINGS: The heart size and mediastinal contours are within normal limits. No pneumothorax or pleural effusion is noted. Mildly increased bibasilar lung opacities are noted concerning for worsening atelectasis or pneumonia. The visualized skeletal structures are unremarkable. IMPRESSION: Mildly increased bibasilar lung opacities are noted concerning for worsening atelectasis or pneumonia. Electronically Signed   By: Marijo Conception M.D.   On: 05/08/2019 09:16

## 2019-05-11 LAB — COMPREHENSIVE METABOLIC PANEL
ALT: 58 U/L — ABNORMAL HIGH (ref 0–44)
AST: 15 U/L (ref 15–41)
Albumin: 2.4 g/dL — ABNORMAL LOW (ref 3.5–5.0)
Alkaline Phosphatase: 72 U/L (ref 38–126)
Anion gap: 10 (ref 5–15)
BUN: 19 mg/dL (ref 6–20)
CO2: 25 mmol/L (ref 22–32)
Calcium: 8.5 mg/dL — ABNORMAL LOW (ref 8.9–10.3)
Chloride: 102 mmol/L (ref 98–111)
Creatinine, Ser: 1.58 mg/dL — ABNORMAL HIGH (ref 0.44–1.00)
GFR calc Af Amer: 47 mL/min — ABNORMAL LOW (ref >60)
GFR calc non Af Amer: 40 mL/min — ABNORMAL LOW (ref >60)
Glucose, Bld: 159 mg/dL — ABNORMAL HIGH (ref 70–99)
Potassium: 3.7 mmol/L (ref 3.5–5.1)
Sodium: 137 mmol/L (ref 135–145)
Total Bilirubin: 0.6 mg/dL (ref 0.3–1.2)
Total Protein: 5.5 g/dL — ABNORMAL LOW (ref 6.5–8.1)

## 2019-05-11 LAB — CBC
HCT: 42.5 % (ref 36.0–46.0)
Hemoglobin: 13.9 g/dL (ref 12.0–15.0)
MCH: 28.4 pg (ref 26.0–34.0)
MCHC: 32.7 g/dL (ref 30.0–36.0)
MCV: 86.7 fL (ref 80.0–100.0)
Platelets: 482 10*3/uL — ABNORMAL HIGH (ref 150–400)
RBC: 4.9 MIL/uL (ref 3.87–5.11)
RDW: 14 % (ref 11.5–15.5)
WBC: 13.6 10*3/uL — ABNORMAL HIGH (ref 4.0–10.5)
nRBC: 0.1 % (ref 0.0–0.2)

## 2019-05-11 LAB — FERRITIN: Ferritin: 90 ng/mL (ref 11–307)

## 2019-05-11 LAB — D-DIMER, QUANTITATIVE: D-Dimer, Quant: <0.27 ug/mL-FEU (ref 0.00–0.50)

## 2019-05-11 LAB — C-REACTIVE PROTEIN: CRP: 1.3 mg/dL — ABNORMAL HIGH (ref <1.0)

## 2019-05-11 NOTE — Progress Notes (Signed)
Pt ambulated 120+ft down hallway. O2 sats dropped to low 80s on RA before being placed on 2 [place]. O2 sats increased to 91-93% on 2L [place].

## 2019-05-11 NOTE — Progress Notes (Signed)
PROGRESS NOTE                                                                                                                                                                                                             Patient Demographics:    Maria Bradford, is a 39 y.o. female, DOB - 11-08-1980, FUX:[id number]  Outpatient Primary MD for the patient is Belva Bertin, [place], [place]   Admit date - 05/05/2019   LOS - 6  No chief complaint on file.      Brief Narrative: Patient is a 39 y.o. female with PMHx of s/p PCI in 2019, HTN who presented with acute hypoxic respiratory failure secondary to COVID-19 pneumonia.  Significant Events: 4/26>> admit to [facility] for hypoxemia secondary to COVID-19  COVID-19 medications: Steroids: 4/26>> Remdesivir: 4/26  Antibiotics: None  Microbiology data: 4/26: Blood culture>> negative  DVT prophylaxis: SQ heparin  Procedures: None  Consults: None    Subjective:   Improved-on room air this morning-has not yet ambulated. But does get still short of breath with activity.   Assessment  & Plan :   Acute Hypoxic Resp Failure due to Covid 19 Viral pneumonia: Improved-titrate to room air this morning-need to evaluate oxygen requirement during ambulation. Continue steroids-has finished a course of remdesivir. Volume status stable-no need for Lasix today. Follow.  Fever: afebrile  O2 requirements:  SpO2: 95 % O2 Flow Rate (L/min): 2 L/min   COVID-19 Labs: Recent Labs    05/09/19 0449 05/10/19 0512 05/11/19 0640  DDIMER <0.27 <0.27 <0.27  FERRITIN 127 109 90  CRP 0.7 1.9* 1.3*       Component Value Date/Time   BNP 28.9 05/10/2019 0512    Recent Labs  Lab 05/05/19 0110 05/09/19 0716  PROCALCITON <0.10 <0.10    No results found for: SARSCOV2NAA   Prone/Incentive Spirometry: encouraged  incentive spirometry use 3-4/hour.  AKI on CKD stage IIIb: Likely  hemodynamically mediated secondary to diarrhea-improving with supportive care-avoid nephrotoxic agents.  Creatinine close to usual baseline.  Transaminitis: Secondary to COVID-19-follow-if worsens we can consider further work-up.  CAD: No anginal symptoms-continue aspirin and low-dose beta-blocker  HTN: BP controlled.  Continue to hold other antihypertensives  Obesity: Estimated body mass index is 36.69 kg/m as calculated from the following:   Height as of this encounter: 5' 6"  (1.676 m).  Weight as of this encounter: 103.1 kg.   ABG: No results found for: PHART, PCO2ART, PO2ART, HCO3, TCO2, ACIDBASEDEF, O2SAT  Vent Settings: N/A  Condition -Stable  Family Communication  : Spoke with daughter over the phone on 5/1-we will update on 5/2.  Code Status :  Full Code  Diet :  Diet Order            Diet 2 gram sodium Room service appropriate? Yes; Fluid consistency: Thin  Diet effective now               Disposition Plan  :  Status is: Inpatient  Remains inpatient appropriate because:Inpatient level of care appropriate due to severity of illness  Dispo: The patient is from: Home              Anticipated d/c is to: Home              Anticipated d/c date is: 2 days              Patient currently is not medically stable to d/c.   Barriers to discharge: Hypoxia requiring O2 supplementation  Antimicorbials  :    Anti-infectives (From admission, onward)   Start     Dose/Rate Route Frequency Ordered Stop   05/06/19 1000  remdesivir 100 mg in sodium chloride 0.9 % 100 mL IVPB  Status:  Discontinued     100 mg 200 mL/hr over 30 Minutes Intravenous Daily 05/05/19 0535 05/05/19 1518   05/06/19 1000  remdesivir 100 mg in sodium chloride 0.9 % 100 mL IVPB  Status:  Discontinued     100 mg 200 mL/hr over 30 Minutes Intravenous Daily 05/05/19 1510 05/05/19 1514   05/06/19 1000  remdesivir 100 mg in sodium chloride 0.9 % 100 mL IVPB     100 mg 200 mL/hr over 30 Minutes  Intravenous Daily 05/05/19 1515 05/09/19 1812   05/05/19 1515  remdesivir 200 mg in sodium chloride 0.9% 250 mL IVPB  Status:  Discontinued     200 mg 580 mL/hr over 30 Minutes Intravenous Once 05/05/19 1510 05/05/19 1514   05/05/19 1000  remdesivir 100 mg in sodium chloride 0.9 % 100 mL IVPB     100 mg 200 mL/hr over 30 Minutes Intravenous Every 30 min 05/05/19 0535 05/05/19 1158      Inpatient Medications  Scheduled Meds: . albuterol  2 puff Inhalation Q6H  . vitamin C  1,000 mg Oral BID   And  . cholecalciferol  1,000 Units Oral BID  . aspirin EC  81 mg Oral Daily  . benzonatate  200 mg Oral TID  . carvedilol  3.125 mg Oral BID WC  . dexamethasone  6 mg Oral Q24H  . famotidine  20 mg Oral BID  . heparin  5,000 Units Subcutaneous Q8H  . lamoTRIgine  100 mg Oral Daily  . mouth rinse  15 mL Mouth Rinse BID  . sertraline  50 mg Oral Daily   Continuous Infusions: . sodium chloride     PRN Meds:.sodium chloride, acetaminophen, albuterol, chlorpheniramine-HYDROcodone, guaiFENesin-dextromethorphan, hydrALAZINE, lip balm, ondansetron **OR** ondansetron (ZOFRAN) IV, polyvinyl alcohol   Time Spent in minutes  25  See all Orders from today for further details   Oren Binet M.D on 05/11/2019 at 2:13 PM  To page go to [email or web address] - use universal password  [facility] -  Office  [phone]    Objective:   Vitals:   05/10/19 1603 05/10/19 2100 05/11/19 0400 05/11/19 9735  BP: (!) 118/93 117/88 (!) 120/97   Pulse: 85 82 78   Resp: 17 20 18    Temp: 98.2 F (36.8 C) 98.2 F (36.8 C) 98.1 F (36.7 C)   TempSrc: Oral Oral Oral   SpO2: 99% 100% 95% 95%  Weight:      Height:        Wt Readings from Last 3 Encounters:  05/07/19 103.1 kg  05/08/16 96.2 kg  01/16/14 93 kg     Intake/Output Summary (Last 24 hours) at 05/11/2019 1413 Last data filed at 05/11/2019 0444 Gross per 24 hour  Intake 120 ml  Output --  Net 120 ml     Physical Exam Gen Exam:Alert  awake-not in any distress HEENT:atraumatic, normocephalic Chest: B/L clear to auscultation anteriorly CVS:S1S2 regular Abdomen:soft non tender, non distended Extremities: Trace edema Neurology: Non focal Skin: no rash   Data Review:    CBC Recent Labs  Lab 05/06/19 0659 05/06/19 0659 05/07/19 0610 05/08/19 0447 05/09/19 0449 05/10/19 0512 05/11/19 0640  WBC 8.7   < > 8.6 8.7 10.4 12.3* 13.6*  HGB 13.4   < > 12.8 13.3 13.7 14.1 13.9  HCT 41.5   < > 39.3 40.9 42.1 42.5 42.5  PLT 253   < > 294 332 336 433* 482*  MCV 89.1   < > 88.3 88.7 88.3 86.4 86.7  MCH 28.8   < > 28.8 28.9 28.7 28.7 28.4  MCHC 32.3   < > 32.6 32.5 32.5 33.2 32.7  RDW 14.2   < > 14.3 14.4 14.1 13.9 14.0  LYMPHSABS 1.3  --  1.2 1.5 1.4 1.7  --   MONOABS 0.4  --  0.3 0.6 0.5 0.9  --   EOSABS 0.0  --  0.0 0.0 0.0 0.1  --   BASOSABS 0.0  --  0.0 0.0 0.0 0.0  --    < > = values in this interval not displayed.    Chemistries  Recent Labs  Lab 05/06/19 0659 05/06/19 0659 05/07/19 0610 05/08/19 0447 05/09/19 0449 05/10/19 0512 05/11/19 0640  NA 140   < > 138 139 137 138 137  K 4.4   < > 4.1 4.1 3.8 4.0 3.7  CL 110   < > 108 106 105 102 102  CO2 22   < > 20* 22 20* 25 25  GLUCOSE 155*   < > 157* 158* 157* 159* 159*  BUN 17   < > 18 19 22* 20 19  CREATININE 1.44*   < > 1.36* 1.40* 1.47* 1.47* 1.58*  CALCIUM 8.5*   < > 8.4* 8.4* 8.4* 8.7* 8.5*  MG 2.2  --  2.2 2.4  --   --   --   AST 37   < > 92* 60* 33 26 15  ALT 51*   < > 120* 122* 90* 81* 58*  ALKPHOS 72   < > 66 71 72 82 72  BILITOT 0.4   < > 0.2* 0.6 0.9 0.7 0.6   < > = values in this interval not displayed.   ------------------------------------------------------------------------------------------------------------------ No results for input(s): CHOL, HDL, LDLCALC, TRIG, CHOLHDL, LDLDIRECT in the last 72 hours.  No results found for:  HGBA1C ------------------------------------------------------------------------------------------------------------------ No results for input(s): TSH, T4TOTAL, T3FREE, THYROIDAB in the last 72 hours.  Invalid input(s): FREET3 ------------------------------------------------------------------------------------------------------------------ Recent Labs    05/10/19 0512 05/11/19 0640  FERRITIN 109 90    Coagulation profile No results for input(s): INR, PROTIME in the last  168 hours.  Recent Labs    05/10/19 0512 05/11/19 0640  DDIMER <0.27 <0.27    Cardiac Enzymes No results for input(s): CKMB, TROPONINI, MYOGLOBIN in the last 168 hours.  Invalid input(s): CK ------------------------------------------------------------------------------------------------------------------    Component Value Date/Time   BNP 28.9 05/10/2019 1751    Micro Results Recent Results (from the past 240 hour(s))  Blood Culture (routine x 2)     Status: None   Collection Time: 05/05/19  1:30 AM   Specimen: BLOOD  Result Value Ref Range Status   Specimen Description   Final    BLOOD RIGHT ANTECUBITAL Performed at [facility], [address] [place], [place] [address]    Special Requests   Final    BOTTLES DRAWN AEROBIC AND ANAEROBIC Blood Culture adequate volume Performed at [facility], [address], [place], [place] [address]    Culture   Final    NO GROWTH 5 DAYS Performed at [facility], [place] [address], [place], [place] [address]    Report Status 05/10/2019 FINAL  Final  Blood Culture (routine x 2)     Status: None   Collection Time: 05/05/19  1:35 AM   Specimen: BLOOD  Result Value Ref Range Status   Specimen Description   Final    BLOOD LEFT ANTECUBITAL Performed at [facility], [address] [place], [place] [address]    Special Requests   Final    BOTTLES DRAWN AEROBIC AND ANAEROBIC Blood Culture adequate volume Performed at [facility], [address] [place], [place] [address]    Culture   Final    NO GROWTH 5 DAYS Performed at [facility], [place] [address] [place], [place] [address]    Report Status 05/10/2019 FINAL  Final  SARS Coronavirus 2 Ag (30 min TAT) - Nasal Swab (BD Veritor Kit)     Status: Abnormal   Collection Time: 05/05/19  5:17 AM   Specimen: Nasal Swab (BD Veritor Kit)  Result Value Ref Range Status   SARS Coronavirus 2 Ag POSITIVE (A) NEGATIVE Final    Comment: RESULT CALLED TO, READ BACK BY AND VERIFIED WITH: LUBECK,E AT 9509 ON [id number] BY CHERESNOWSKY,T (NOTE) SARS-CoV-2 antigen PRESENT. Positive results indicate the presence of viral antigens, but clinical correlation with patient history and other diagnostic information is necessary to determine patient infection status.  Positive results do not rule out bacterial infection or co-infection  with other viruses. False positive results are rare but can occur, and confirmatory RT-PCR testing may be appropriate in some circumstances. The expected result is Negative. Fact Sheet for Patients: PodPark.tn Fact Sheet for Providers: GiftContent.is  This test is not yet approved or cleared by the [place] FDA and  has been authorized for detection and/or diagnosis of SARS-CoV-2 by FDA under an Emergency Use Authorization (EUA).  This EUA will remain in effect (meaning this test can be used) for the duration of  the COVID-1 9 declaration under Section 564(b)(1) of the Act, 21 U.S.C. section 360bbb-3(b)(1), unless the authorization is terminated or revoked sooner. Performed at [facility], [address] [place], [place] [address]     Radiology Reports DG CHEST PORT 1 VIEW  Result Date: 05/09/2019 CLINICAL DATA:  COVID-19 positive. Tachycardia. EXAM: PORTABLE CHEST 1 VIEW COMPARISON:  05/08/2019 FINDINGS: Worsening streaky opacities in the right  lung. Mild left lung opacities are unchanged. No pleural effusion.  IMPRESSION: Worsening right and unchanged left lung opacities, likely infection. Electronically Signed   By: Ulyses Jarred M.D.   On: 05/09/2019 01:57   DG CHEST PORT 1 VIEW  Result Date: 05/06/2019 CLINICAL DATA:  Cough, shortness of breath. EXAM: PORTABLE CHEST 1 VIEW COMPARISON:  May 05, 2019. FINDINGS: The heart size and mediastinal contours are within normal limits. Stable bilateral lung opacities are noted consistent with multifocal pneumonia. No pneumothorax or pleural effusion is noted. The visualized skeletal structures are unremarkable. IMPRESSION: Stable bilateral lung opacities are noted consistent with multifocal pneumonia. Electronically Signed   By: Marijo Conception M.D.   On: 05/06/2019 09:17   DG Chest Port 1 View  Result Date: 05/05/2019 CLINICAL DATA:  COVID pneumonia EXAM: PORTABLE CHEST 1 VIEW COMPARISON:  May 03, 2019 FINDINGS: The heart size and mediastinal contours are within normal limits. Overall shallow degree of aeration. There is hazy/patchy airspace opacity seen within the right lower lung and periphery of the left lung base. No pleural effusion. No acute osseous abnormality. IMPRESSION: Slight interval worsening in the multifocal airspace opacities. Electronically Signed   By: Prudencio Pair M.D.   On: 05/05/2019 01:32   DG Chest Port 1V same Day  Result Date: 05/08/2019 CLINICAL DATA:  Shortness of breath, cough. EXAM: PORTABLE CHEST 1 VIEW COMPARISON:  May 06, 2019. FINDINGS: The heart size and mediastinal contours are within normal limits. No pneumothorax or pleural effusion is noted. Mildly increased bibasilar lung opacities are noted concerning for worsening atelectasis or pneumonia. The visualized skeletal structures are unremarkable. IMPRESSION: Mildly increased bibasilar lung opacities are noted concerning for worsening atelectasis or pneumonia. Electronically Signed   By: Marijo Conception M.D.    On: 05/08/2019 09:16

## 2019-05-12 DIAGNOSIS — J189 Pneumonia, unspecified organism: Secondary | ICD-10-CM

## 2019-05-12 LAB — BASIC METABOLIC PANEL
Anion gap: 9 (ref 5–15)
BUN: 19 mg/dL (ref 6–20)
CO2: 25 mmol/L (ref 22–32)
Calcium: 8.5 mg/dL — ABNORMAL LOW (ref 8.9–10.3)
Chloride: 103 mmol/L (ref 98–111)
Creatinine, Ser: 1.53 mg/dL — ABNORMAL HIGH (ref 0.44–1.00)
GFR calc Af Amer: 48 mL/min — ABNORMAL LOW (ref >60)
GFR calc non Af Amer: 42 mL/min — ABNORMAL LOW (ref >60)
Glucose, Bld: 174 mg/dL — ABNORMAL HIGH (ref 70–99)
Potassium: 4.1 mmol/L (ref 3.5–5.1)
Sodium: 137 mmol/L (ref 135–145)

## 2019-05-12 MED ORDER — DEXAMETHASONE 6 MG PO TABS: 6.0000 mg | ORAL_TABLET | ORAL | 0 refills | Status: DC

## 2019-05-12 MED ORDER — CARVEDILOL 6.25 MG PO TABS: 6.2500 mg | ORAL_TABLET | Freq: Two times a day (BID) | ORAL | 0 refills | Status: AC

## 2019-05-12 MED ORDER — ALBUTEROL SULFATE HFA 108 (90 BASE) MCG/ACT IN AERS: 2.0000 | INHALATION_SPRAY | Freq: Four times a day (QID) | RESPIRATORY_TRACT | 0 refills | Status: DC | PRN

## 2019-05-12 MED ORDER — BENZONATATE 200 MG PO CAPS: 200.0000 mg | ORAL_CAPSULE | Freq: Three times a day (TID) | ORAL | 0 refills | Status: DC | PRN

## 2019-05-12 NOTE — Evaluation (Signed)
Occupational Therapy Evaluation Patient Details Name: Maria Bradford MRN: [id number] DOB: 25-Mar-1980 Today's Date: 05/12/2019    History of Present Illness 39 y.o. 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 Tested positive for COVID 4/19. Admitted to [facility] 05/05/19 for treatement of acute hypoxic respiratory failure secondary to COVID 19 PNA, as well as AKI.    Clinical Impression   This 39 y/o female presents with the above. PTA pt reports independence with ADL, iADL and functional mobility. Pt completing functional transfers and room level mobility without AD, LB, toileting and standing grooming ADL this session at supervision level throughout. Pt grossly with 2/4 DOE with standing activity; pt on RA upon arrival with O2 sats in the 90s, with activity O2 sats decrease into the 80s, requiring seated rest and cues for deep breathing techniques. Given seated rest/breathing O2 sats return to >/=90% and remaining at 90% end of session. Discussed likely need for supplemental O2 initially after discharge home. Further educated/reviewed activity progression and energy conservation techniques for use during completion of ADL and mobility tasks after return home; handout issued and pt verbalizing understanding. Will continue to follow while pt remains in acute setting, do not anticipate pt will require follow up OT services after d/c.     Follow Up Recommendations  No OT follow up;Supervision - Intermittent    Equipment Recommendations  None recommended by OT    Recommendations for Other Services       Precautions / Restrictions Precautions Precautions: None Restrictions Weight Bearing Restrictions: No      Mobility Bed Mobility Overal bed mobility: Modified Independent                Transfers Overall transfer level: Needs assistance Equipment used:  None Transfers: Sit to/from Stand Sit to Stand: Modified independent (Device/Increase time)              Balance Overall balance assessment: Mild deficits observed, not formally tested                                         ADL either performed or assessed with clinical judgement   ADL Overall ADL's : Needs assistance/impaired Eating/Feeding: Modified independent;Sitting Eating/Feeding Details (indicate cue type and reason): eating breakfast end of session Grooming: Wash/dry hands;Wash/dry face;Supervision/safety;Standing   Upper Body Bathing: Set up;Supervision/ safety;Sitting   Lower Body Bathing: Supervison/ safety;Sit to/from stand Lower Body Bathing Details (indicate cue type and reason): educated on sitting for bathing task initially as form of energy conservation Upper Body Dressing : Modified independent;Sitting   Lower Body Dressing: Supervision/safety;Sit to/from stand Lower Body Dressing Details (indicate cue type and reason): donning socks via figure 4 Toilet Transfer: Supervision/safety;Ambulation;Regular Toilet   Toileting- Water quality scientist and Hygiene: Supervision/safety;Sit to/from stand;Sitting/lateral lean Toileting - Clothing Manipulation Details (indicate cue type and reason): performing pericare with distant supervision     Functional mobility during ADLs: Supervision/safety General ADL Comments: pt overall at supervision level for ADL and mobility tasks, limited mostly due to increased DOE with activity and decreased respiratory status; educated/reviewed energy conservation techniques and activity progression after discharge, handout issued      Vision         Perception     Praxis      Pertinent Vitals/Pain Pain Assessment:  No/denies pain     Hand Dominance     Extremity/Trunk Assessment Upper Extremity Assessment Upper Extremity Assessment: Overall WFL for tasks assessed   Lower Extremity Assessment Lower  Extremity Assessment: Defer to PT evaluation       Communication Communication Communication: No difficulties   Cognition Arousal/Alertness: Awake/alert Behavior During Therapy: WFL for tasks assessed/performed Overall Cognitive Status: Within Functional Limits for tasks assessed                                     General Comments       Exercises Exercises: Other exercises Other Exercises Other Exercises: reinforced use of IS and flutter valve   Shoulder Instructions      Home Living Family/patient expects to be discharged to:: Private residence Living Arrangements: Children Available Help at Discharge: Family;Available PRN/intermittently Type of Home: House Home Access: Stairs to enter [employer] of Steps: 1   Home Layout: One level     Bathroom Shower/Tub: Chief Strategy Officer: Standard Bathroom Accessibility: Yes   Home Equipment: None          Prior Functioning/Environment Level of Independence: Independent        Comments: supposed to start new job prior to getting COVID        OT Problem List: Decreased activity tolerance;Cardiopulmonary status limiting activity;Obesity;Decreased knowledge of use of DME or AE      OT Treatment/Interventions: Self-care/ADL training;Therapeutic exercise;Energy conservation;DME and/or AE instruction;Therapeutic activities;Patient/family education;Balance training    OT Goals(Current goals can be found in the care plan section) Acute Rehab OT Goals Patient Stated Goal: breathe better OT Goal Formulation: With patient Time For Goal Achievement: 05/26/19 Potential to Achieve Goals: Good  OT Frequency: Min 2X/week   Barriers to D/C:            Co-evaluation              AM-PAC OT "6 Clicks" Daily Activity     Outcome Measure Help from another person eating meals?: None Help from another person taking care of personal grooming?: A Little Help from another person  toileting, which includes using toliet, bedpan, or urinal?: A Little Help from another person bathing (including washing, rinsing, drying)?: A Little Help from another person to put on and taking off regular upper body clothing?: None Help from another person to put on and taking off regular lower body clothing?: A Little 6 Click Score: 20   End of Session Nurse Communication: Mobility status  Activity Tolerance: Patient tolerated treatment well Patient left: with call bell/phone within reach;Other (comment)(seated EOB)  OT Visit Diagnosis: Other abnormalities of gait and mobility (R26.89);Other (comment)(decreased activity tolerance)                Time: 4010-2725 OT Time Calculation (min): 23 min Charges:  OT General Charges $OT Visit: 1 Visit OT Evaluation $OT Eval Moderate Complexity: 1 Mod  Marcy Siren, OT Acute Rehabilitation Services Pager [phone] Office [phone]   Orlando Penner 05/12/2019, 10:19 AM

## 2019-05-12 NOTE — TOC Transition Note (Signed)
Transition of Care [facility]) - CM/SW Discharge Note   Patient Details  Name: Yaritza Leist MRN: [id number] Date of Birth: 1980/09/28  [facility] CM/SW Contact:  Cherylann Parr, RN Phone Number: 05/12/2019, 9:51 AM   Clinical Narrative:   PTA independent from home - pt's father will transport her home at discharge.  Pt confirms she has a PCP and denied barriers with paying for discharge meds.  Pt in agreement to discharge home on oxygen.  CM provided choice list - pt chose [facility] - [facility] accepts referral.  [facility] will provide POC to pt before discharge and therefore pt does not need to wait until home oxygen delivery is completed.  [facility] will follow up with pt directly for home delivery.  Discharge orders is signed - no outstanding TOC needs found - CM signing off    Final next level of care: Home/Self Care Barriers to Discharge: Barriers Resolved   Patient Goals and CMS Choice Patient states their goals for this hospitalization and ongoing recovery are:: Pt states she is ready to get back home and settle in CMS Medicare.gov Compare Post Acute Care list provided to:: Patient Choice offered to / list presented to : Patient  Discharge Placement                       Discharge Plan and Services                DME Arranged: Oxygen DME Agency: [facility] Date DME Agency Contacted: 05/12/19 Time DME Agency Contacted: [phone] Representative spoke with at DME Agency: Zack            Social Determinants of Health (SDOH) Interventions     Readmission Risk Interventions No flowsheet data found.

## 2019-05-12 NOTE — Progress Notes (Signed)
Maria Bradford to be D/C'd Home per MD order.  Discussed with the patient and all questions fully answered.  VSS, Skin clean, dry and intact without evidence of skin break down, no evidence of skin tears noted. IV catheter discontinued intact. Site without signs and symptoms of complications. Dressing and pressure applied.  An After Visit Summary was printed and given to the patient. Patient received prescription.  D/c education completed with patient including follow up instructions, medication list, d/c activities limitations if indicated, with other d/c instructions as indicated by MD - patient able to verbalize understanding, all questions fully answered.   Patient instructed to return to ED, call 911, or call MD for any changes in condition.   Patient escorted via WC, and D/C home via private auto.  Maria Bradford 05/12/2019 12:16 PM

## 2019-05-12 NOTE — Discharge Instructions (Signed)
Person Under Monitoring Name: Maria Bradford  Location: [address] [place] [address]   Infection Prevention Recommendations for Individuals Confirmed to have, or Being Evaluated for, 2019 Novel Coronavirus (COVID-19) Infection Who Receive Care at Home  Individuals who are confirmed to have, or are being evaluated for, COVID-19 should follow the prevention steps below until a healthcare provider or local or state health department says they can return to normal activities.  Stay home except to get medical care You should restrict activities outside your home, except for getting medical care. Do not go to work, school, or public areas, and do not use public transportation or taxis.  Call ahead before visiting your doctor Before your medical appointment, call the healthcare provider and tell them that you have, or are being evaluated for, COVID-19 infection. This will help the healthcare provider's office take steps to keep other people from getting infected. Ask your healthcare provider to call the local or state health department.  Monitor your symptoms Seek prompt medical attention if your illness is worsening (e.g., difficulty breathing). Before going to your medical appointment, call the healthcare provider and tell them that you have, or are being evaluated for, COVID-19 infection. Ask your healthcare provider to call the local or state health department.  Wear a facemask You should wear a facemask that covers your nose and mouth when you are in the same room with other people and when you visit a healthcare provider. People who live with or visit you should also wear a facemask while they are in the same room with you.  Separate yourself from other people in your home As much as possible, you should stay in a different room from other people in your home. Also, you should use a separate bathroom, if available.  Avoid sharing household items You should not share  dishes, drinking glasses, cups, eating utensils, towels, bedding, or other items with other people in your home. After using these items, you should wash them thoroughly with soap and water.  Cover your coughs and sneezes Cover your mouth and nose with a tissue when you cough or sneeze, or you can cough or sneeze into your sleeve. Throw used tissues in a lined trash can, and immediately wash your hands with soap and water for at least 20 seconds or use an alcohol-based hand rub.  Wash your [employer] your hands often and thoroughly with soap and water for at least 20 seconds. You can use an alcohol-based hand sanitizer if soap and water are not available and if your hands are not visibly dirty. Avoid touching your eyes, nose, and mouth with unwashed hands.   Prevention Steps for Caregivers and Household Members of Individuals Confirmed to have, or Being Evaluated for, COVID-19 Infection Being Cared for in the Home  If you live with, or provide care at home for, a person confirmed to have, or being evaluated for, COVID-19 infection please follow these guidelines to prevent infection:  Follow healthcare provider's instructions Make sure that you understand and can help the patient follow any healthcare provider instructions for all care.  Provide for the patient's basic needs You should help the patient with basic needs in the home and provide support for getting groceries, prescriptions, and other personal needs.  Monitor the patient's symptoms If they are getting sicker, call his or her medical provider and tell them that the patient has, or is being evaluated for, COVID-19 infection. This will help the healthcare provider's  office take steps to keep other people from getting infected. Ask the healthcare provider to call the local or state health department.  Limit the number of people who have contact with the patient  If possible, have only one caregiver for the patient.  Other  household members should stay in another home or place of residence. If this is not possible, they should stay  in another room, or be separated from the patient as much as possible. Use a separate bathroom, if available.  Restrict visitors who do not have an essential need to be in the home.  Keep older adults, very young children, and other sick people away from the patient Keep older adults, very young children, and those who have compromised immune systems or chronic health conditions away from the patient. This includes people with chronic heart, lung, or kidney conditions, diabetes, and cancer.  Ensure good ventilation Make sure that shared spaces in the home have good air flow, such as from an air conditioner or an opened window, weather permitting.  Wash your hands often  Wash your hands often and thoroughly with soap and water for at least 20 seconds. You can use an alcohol based hand sanitizer if soap and water are not available and if your hands are not visibly dirty.  Avoid touching your eyes, nose, and mouth with unwashed hands.  Use disposable paper towels to dry your hands. If not available, use dedicated cloth towels and replace them when they become wet.  Wear a facemask and gloves  Wear a disposable facemask at all times in the room and gloves when you touch or have contact with the patient's blood, body fluids, and/or secretions or excretions, such as sweat, saliva, sputum, nasal mucus, vomit, urine, or feces.  Ensure the mask fits over your nose and mouth tightly, and do not touch it during use.  Throw out disposable facemasks and gloves after using them. Do not reuse.  Wash your hands immediately after removing your facemask and gloves.  If your personal clothing becomes contaminated, carefully remove clothing and launder. Wash your hands after handling contaminated clothing.  Place all used disposable facemasks, gloves, and other waste in a lined container before  disposing them with other household waste.  Remove gloves and wash your hands immediately after handling these items.  Do not share dishes, glasses, or other household items with the patient  Avoid sharing household items. You should not share dishes, drinking glasses, cups, eating utensils, towels, bedding, or other items with a patient who is confirmed to have, or being evaluated for, COVID-19 infection.  After the person uses these items, you should wash them thoroughly with soap and water.  Wash laundry thoroughly  Immediately remove and wash clothes or bedding that have blood, body fluids, and/or secretions or excretions, such as sweat, saliva, sputum, nasal mucus, vomit, urine, or feces, on them.  Wear gloves when handling laundry from the patient.  Read and follow directions on labels of laundry or clothing items and detergent. In general, wash and dry with the warmest temperatures recommended on the label.  Clean all areas the individual has used often  Clean all touchable surfaces, such as counters, tabletops, doorknobs, bathroom fixtures, toilets, phones, keyboards, tablets, and bedside tables, every day. Also, clean any surfaces that may have blood, body fluids, and/or secretions or excretions on them.  Wear gloves when cleaning surfaces the patient has come in contact with.  Use a diluted bleach solution (e.g., dilute bleach with 1  part bleach and 10 parts water) or a household disinfectant with a label that says EPA-registered for coronaviruses. To make a bleach solution at home, add 1 tablespoon of bleach to 1 quart (4 cups) of water. For a larger supply, add  cup of bleach to 1 gallon (16 cups) of water.  Read labels of cleaning products and follow recommendations provided on product labels. Labels contain instructions for safe and effective use of the cleaning product including precautions you should take when applying the product, such as wearing gloves or eye protection  and making sure you have good ventilation during use of the product.  Remove gloves and wash hands immediately after cleaning.  Monitor yourself for signs and symptoms of illness Caregivers and household members are considered close contacts, should monitor their health, and will be asked to limit movement outside of the home to the extent possible. Follow the monitoring steps for close contacts listed on the symptom monitoring form.   ? If you have additional questions, contact your local health department or call the epidemiologist on call at [phone] (available 24/7). ? This guidance is subject to change. For the most up-to-date guidance from [facility], please refer to their website: [email or web address]

## 2019-05-12 NOTE — Discharge Summary (Signed)
PATIENT DETAILS Name: Maria Bradford Age: 39 y.o. Sex: female Date of Birth: March 09, 1980 MRN: [id number]. Admitting Physician: Lequita Halt, MD XID:[id number], [place], [place]  Admit Date: 05/05/2019 Discharge date: 05/12/2019  Recommendations for Outpatient Follow-up:  1. Follow up with PCP in 1-2 weeks 2. Please obtain CMP/CBC in one week 3. Repeat Chest Xray in 4-6 week 4. Reassess if home O2 is still required at next visit 5. Amlodipine,Lisinopril held-reassess if can be resumed at next visit  Admitted From:  Home  Disposition: [place]: No  Equipment/Devices: oxygen 2L-mostly with ambulation  Discharge Condition: Stable  CODE STATUS: FULL CODE  Diet recommendation:  Diet Order            Diet - low sodium heart healthy        Diet 2 gram sodium Room service appropriate? Yes; Fluid consistency: Thin  Diet effective now               Brief Narrative: Patient is a 39 y.o. female with PMHx of s/p PCI in 2019, HTN who presented with acute hypoxic respiratory failure secondary to COVID-19 pneumonia.  Significant Events: 4/26>> admit to [facility] for hypoxemia secondary to COVID-19  COVID-19 medications: Steroids: 4/26>> Remdesivir: 4/26>>4/30  Antibiotics: None  Microbiology data: 4/26: Blood culture>> negative  Procedures: None  Consults: None  Brief Hospital Course: Acute Hypoxic Resp Failure due to Covid 19 Viral pneumonia:Significantly improved-titrated to room air on 5/2-but does require O2 with ambulation. Continue steroids for a few more days,  has finished a course of remdesivir. Volume status stable.Feels much better and wants to go home today, she will follow up with her PCP in the next week or so.  COVID-19 Labs:  Recent Labs    05/10/19 0512 05/11/19 0640  DDIMER <0.27 <0.27  FERRITIN 109 90  CRP 1.9* 1.3*    No results found for: SARSCOV2NAA   AKI on CKD stage IIIb: Likely hemodynamically mediated secondary to  diarrhea-improving with supportive care-avoid nephrotoxic agents.  Creatinine close to usual baseline.  Transaminitis: Secondary to COVID-19-improved-follow in the outpatient setting  CAD: No anginal symptoms-continue aspirin and low-dose beta-blocker  HTN: BP controlled with Coreg.  Continue to hold other antihypertensives-follow with PCP and resume accordingly  Obesity: Estimated body mass index is 36.69 kg/m as calculated from the following:   Height as of this encounter: 5' 6"  (1.676 m).   Weight as of this encounter: 103.1 kg.   Discharge Diagnoses:  Active Problems:   Pneumonia due to COVID-19 virus   COVID-19   Hypoxia   Discharge Instructions:    Person Under Monitoring Name: Maria Bradford  Location: [address] [place] [address]   Infection Prevention Recommendations for Individuals Confirmed to have, or Being Evaluated for, 2019 Novel Coronavirus (COVID-19) Infection Who Receive Care at Home  Individuals who are confirmed to have, or are being evaluated for, COVID-19 should follow the prevention steps below until a healthcare provider or local or state health department says they can return to normal activities.  Stay home except to get medical care You should restrict activities outside your home, except for getting medical care. Do not go to work, school, or public areas, and do not use public transportation or taxis.  Call ahead before visiting your doctor Before your medical appointment, call the healthcare provider and tell them that you have, or are being evaluated for, COVID-19 infection. This will help the healthcare provider's office take steps to  keep other people from getting infected. Ask your healthcare provider to call the local or state health department.  Monitor your symptoms Seek prompt medical attention if your illness is worsening (e.g., difficulty breathing). Before going to your medical appointment, call the healthcare  provider and tell them that you have, or are being evaluated for, COVID-19 infection. Ask your healthcare provider to call the local or state health department.  Wear a facemask You should wear a facemask that covers your nose and mouth when you are in the same room with other people and when you visit a healthcare provider. People who live with or visit you should also wear a facemask while they are in the same room with you.  Separate yourself from other people in your home As much as possible, you should stay in a different room from other people in your home. Also, you should use a separate bathroom, if available.  Avoid sharing household items You should not share dishes, drinking glasses, cups, eating utensils, towels, bedding, or other items with other people in your home. After using these items, you should wash them thoroughly with soap and water.  Cover your coughs and sneezes Cover your mouth and nose with a tissue when you cough or sneeze, or you can cough or sneeze into your sleeve. Throw used tissues in a lined trash can, and immediately wash your hands with soap and water for at least 20 seconds or use an alcohol-based hand rub.  Wash your [facility] your hands often and thoroughly with soap and water for at least 20 seconds. You can use an alcohol-based hand sanitizer if soap and water are not available and if your hands are not visibly dirty. Avoid touching your eyes, nose, and mouth with unwashed hands.   Prevention Steps for Caregivers and Household Members of Individuals Confirmed to have, or Being Evaluated for, COVID-19 Infection Being Cared for in the Home  If you live with, or provide care at home for, a person confirmed to have, or being evaluated for, COVID-19 infection please follow these guidelines to prevent infection:  Follow healthcare provider's instructions Make sure that you understand and can help the patient follow any healthcare provider  instructions for all care.  Provide for the patient's basic needs You should help the patient with basic needs in the home and provide support for getting groceries, prescriptions, and other personal needs.  Monitor the patient's symptoms If they are getting sicker, call his or her medical provider and tell them that the patient has, or is being evaluated for, COVID-19 infection. This will help the healthcare provider's office take steps to keep other people from getting infected. Ask the healthcare provider to call the local or state health department.  Limit the number of people who have contact with the patient  If possible, have only one caregiver for the patient.  Other household members should stay in another home or place of residence. If this is not possible, they should stay  in another room, or be separated from the patient as much as possible. Use a separate bathroom, if available.  Restrict visitors who do not have an essential need to be in the home.  Keep older adults, very young children, and other sick people away from the patient Keep older adults, very young children, and those who have compromised immune systems or chronic health conditions away from the patient. This includes people with chronic heart, lung, or kidney conditions, diabetes, and cancer.  Ensure good  ventilation Make sure that shared spaces in the home have good air flow, such as from an air conditioner or an opened window, weather permitting.  Wash your hands often  Wash your hands often and thoroughly with soap and water for at least 20 seconds. You can use an alcohol based hand sanitizer if soap and water are not available and if your hands are not visibly dirty.  Avoid touching your eyes, nose, and mouth with unwashed hands.  Use disposable paper towels to dry your hands. If not available, use dedicated cloth towels and replace them when they become wet.  Wear a facemask and gloves  Wear a  disposable facemask at all times in the room and gloves when you touch or have contact with the patient's blood, body fluids, and/or secretions or excretions, such as sweat, saliva, sputum, nasal mucus, vomit, urine, or feces.  Ensure the mask fits over your nose and mouth tightly, and do not touch it during use.  Throw out disposable facemasks and gloves after using them. Do not reuse.  Wash your hands immediately after removing your facemask and gloves.  If your personal clothing becomes contaminated, carefully remove clothing and launder. Wash your hands after handling contaminated clothing.  Place all used disposable facemasks, gloves, and other waste in a lined container before disposing them with other household waste.  Remove gloves and wash your hands immediately after handling these items.  Do not share dishes, glasses, or other household items with the patient  Avoid sharing household items. You should not share dishes, drinking glasses, cups, eating utensils, towels, bedding, or other items with a patient who is confirmed to have, or being evaluated for, COVID-19 infection.  After the person uses these items, you should wash them thoroughly with soap and water.  Wash laundry thoroughly  Immediately remove and wash clothes or bedding that have blood, body fluids, and/or secretions or excretions, such as sweat, saliva, sputum, nasal mucus, vomit, urine, or feces, on them.  Wear gloves when handling laundry from the patient.  Read and follow directions on labels of laundry or clothing items and detergent. In general, wash and dry with the warmest temperatures recommended on the label.  Clean all areas the individual has used often  Clean all touchable surfaces, such as counters, tabletops, doorknobs, bathroom fixtures, toilets, phones, keyboards, tablets, and bedside tables, every day. Also, clean any surfaces that may have blood, body fluids, and/or secretions or excretions on  them.  Wear gloves when cleaning surfaces the patient has come in contact with.  Use a diluted bleach solution (e.g., dilute bleach with 1 part bleach and 10 parts water) or a household disinfectant with a label that says EPA-registered for coronaviruses. To make a bleach solution at home, add 1 tablespoon of bleach to 1 quart (4 cups) of water. For a larger supply, add  cup of bleach to 1 gallon (16 cups) of water.  Read labels of cleaning products and follow recommendations provided on product labels. Labels contain instructions for safe and effective use of the cleaning product including precautions you should take when applying the product, such as wearing gloves or eye protection and making sure you have good ventilation during use of the product.  Remove gloves and wash hands immediately after cleaning.  Monitor yourself for signs and symptoms of illness Caregivers and household members are considered close contacts, should monitor their health, and will be asked to limit movement outside of the home to the extent possible. Follow  the monitoring steps for close contacts listed on the symptom monitoring form.   ? If you have additional questions, contact your local health department or call the epidemiologist on call at [phone] (available 24/7). ? This guidance is subject to change. For the most up-to-date guidance from CDC, please refer to their website: YouBlogs.pl    Activity:  As tolerated   Discharge Instructions    Call MD for:  difficulty breathing, headache or visual disturbances   Complete by: As directed    Call MD for:  persistant dizziness or light-headedness   Complete by: As directed    Call MD for:  persistant nausea and vomiting   Complete by: As directed    Diet - low sodium heart healthy   Complete by: As directed    Discharge instructions   Complete by: As directed    1.) 3 weeks of  isolation from 05/05/19  2.) Ask your PCP to reassess home O2 requirement at your next visit   Follow with Primary MD  Belva Bertin, [place], PA-C in 1-2 weeks  Please get a complete blood count and chemistry panel checked by your Primary MD at your next visit, and again as instructed by your Primary MD.  Get Medicines reviewed and adjusted: Please take all your medications with you for your next visit with your Primary MD  Laboratory/radiological data: Please request your Primary MD to go over all hospital tests and procedure/radiological results at the follow up, please ask your Primary MD to get all Hospital records sent to his/her office.  In some cases, they will be blood work, cultures and biopsy results pending at the time of your discharge. Please request that your primary care M.D. follows up on these results.  Also Note the following: If you experience worsening of your admission symptoms, develop shortness of breath, life threatening emergency, suicidal or homicidal thoughts you must seek medical attention immediately by calling 911 or calling your MD immediately  if symptoms less severe.  You must read complete instructions/literature along with all the possible adverse reactions/side effects for all the Medicines you take and that have been prescribed to you. Take any new Medicines after you have completely understood and accpet all the possible adverse reactions/side effects.   Do not drive when taking Pain medications or sleeping medications (Benzodaizepines)  Do not take more than prescribed Pain, Sleep and Anxiety Medications. It is not advisable to combine anxiety,sleep and pain medications without talking with your primary care practitioner  Special Instructions: If you have smoked or chewed Tobacco  in the last 2 yrs please stop smoking, stop any regular Alcohol  and or any Recreational drug use.  Wear Seat belts while driving.  Please note: You were cared for by a  hospitalist during your hospital stay. Once you are discharged, your primary care physician will handle any further medical issues. Please note that NO REFILLS for any discharge medications will be authorized once you are discharged, as it is imperative that you return to your primary care physician (or establish a relationship with a primary care physician if you do not have one) for your post hospital discharge needs so that they can reassess your need for medications and monitor your lab values.   Increase activity slowly   Complete by: As directed      Allergies as of 05/12/2019      Reactions   Oxycodone-acetaminophen Nausea And Vomiting   Oxycodone Nausea And Vomiting      Medication  List    STOP taking these medications   amLODipine 10 MG tablet Commonly known as: NORVASC   doxycycline 100 MG tablet Commonly known as: VIBRA-TABS   lisinopril 5 MG tablet Commonly known as: ZESTRIL     TAKE these medications   acetaminophen 500 MG tablet Commonly known as: TYLENOL Take 1,000 mg by mouth every 6 (six) hours as needed for fever or headache (pain). For pain   albuterol 108 (90 Base) MCG/ACT inhaler Commonly known as: VENTOLIN HFA Inhale 2 puffs into the lungs every 6 (six) hours as needed.   aspirin EC 81 MG tablet Take 81 mg by mouth daily.   benzonatate 200 MG capsule Commonly known as: TESSALON Take 1 capsule (200 mg total) by mouth 3 (three) times daily as needed for cough.   carvedilol 6.25 MG tablet Commonly known as: COREG Take 1 tablet (6.25 mg total) by mouth 2 (two) times daily. What changed:   medication strength  how much to take   dexamethasone 6 MG tablet Commonly known as: DECADRON Take 1 tablet (6 mg total) by mouth daily. Start taking on: May 13, 2019   Emergen-C Immune Plus/Vit D Diona Fanti 1 tablet by mouth 2 (two) times daily.   furosemide 20 MG tablet Commonly known as: LASIX Take 20 mg by mouth daily.   hydroxypropyl methylcellulose /  hypromellose 2.5 % ophthalmic solution Commonly known as: ISOPTO TEARS / GONIOVISC Place 1 drop into both eyes 4 (four) times daily as needed for dry eyes.   lamoTRIgine 100 MG tablet Commonly known as: LAMICTAL Take 100 mg by mouth daily.   Nexplanon 68 MG Impl implant Generic drug: etonogestrel 1 each by Subdermal route once. Implanted 2020   sertraline 50 MG tablet Commonly known as: ZOLOFT Take 50 mg by mouth daily.            Durable Medical Equipment  (From admission, onward)         Start     Ordered   05/11/19 1413  For home use only DME oxygen  Once    Question Answer Comment  Length of Need 6 Months   Mode or (Route) Nasal cannula   Liters per Minute 2   Frequency Continuous (stationary and portable oxygen unit needed)   Oxygen conserving device Yes   Oxygen delivery system Gas      05/11/19 1413         Follow-up Information    [place], [place], [place]. Schedule an appointment as soon as possible for a visit in 1 week(s).   Specialty: Family Medicine Contact information: [phone] [address] [place] [place] [address] [phone]          Allergies  Allergen Reactions  . Oxycodone-Acetaminophen Nausea And Vomiting  . Oxycodone Nausea And Vomiting      Other Procedures/Studies: DG CHEST PORT 1 VIEW  Result Date: 05/09/2019 CLINICAL DATA:  COVID-19 positive. Tachycardia. EXAM: PORTABLE CHEST 1 VIEW COMPARISON:  05/08/2019 FINDINGS: Worsening streaky opacities in the right lung. Mild left lung opacities are unchanged. No pleural effusion. IMPRESSION: Worsening right and unchanged left lung opacities, likely infection. Electronically Signed   By: Ulyses Jarred M.D.   On: 05/09/2019 01:57   DG CHEST PORT 1 VIEW  Result Date: 05/06/2019 CLINICAL DATA:  Cough, shortness of breath. EXAM: PORTABLE CHEST 1 VIEW COMPARISON:  May 05, 2019. FINDINGS: The heart size and mediastinal contours are within normal limits. Stable bilateral lung opacities  are noted consistent with  multifocal pneumonia. No pneumothorax or pleural effusion is noted. The visualized skeletal structures are unremarkable. IMPRESSION: Stable bilateral lung opacities are noted consistent with multifocal pneumonia. Electronically Signed   By: Marijo Conception M.D.   On: 05/06/2019 09:17   DG Chest Port 1 View  Result Date: 05/05/2019 CLINICAL DATA:  COVID pneumonia EXAM: PORTABLE CHEST 1 VIEW COMPARISON:  May 03, 2019 FINDINGS: The heart size and mediastinal contours are within normal limits. Overall shallow degree of aeration. There is hazy/patchy airspace opacity seen within the right lower lung and periphery of the left lung base. No pleural effusion. No acute osseous abnormality. IMPRESSION: Slight interval worsening in the multifocal airspace opacities. Electronically Signed   By: Prudencio Pair M.D.   On: 05/05/2019 01:32   DG Chest Port 1V same Day  Result Date: 05/08/2019 CLINICAL DATA:  Shortness of breath, cough. EXAM: PORTABLE CHEST 1 VIEW COMPARISON:  May 06, 2019. FINDINGS: The heart size and mediastinal contours are within normal limits. No pneumothorax or pleural effusion is noted. Mildly increased bibasilar lung opacities are noted concerning for worsening atelectasis or pneumonia. The visualized skeletal structures are unremarkable. IMPRESSION: Mildly increased bibasilar lung opacities are noted concerning for worsening atelectasis or pneumonia. Electronically Signed   By: Marijo Conception M.D.   On: 05/08/2019 09:16      TODAY-DAY OF DISCHARGE:  Subjective:   Avea Mcgowen today has no headache,no chest abdominal pain,no new weakness tingling or numbness, feels much better wants to go home today.   Objective:   Blood pressure 138/90, pulse 76, temperature 98 F (36.7 C), temperature source Oral, resp. rate 18, height 5' 6"  (1.676 m), weight 103.1 kg, SpO2 100 %, not currently breastfeeding.  Intake/Output Summary (Last 24 hours) at 05/12/2019 0933 Last  data filed at 05/12/2019 0905 Gross per 24 hour  Intake 360 ml  Output --  Net 360 ml   Filed Weights   05/05/19 0033 05/07/19 0500  Weight: 102.1 kg 103.1 kg    Exam: Awake Alert, Oriented *3, No new F.N deficits, Normal affect [place].AT,PERRAL Supple Neck,No JVD, No cervical lymphadenopathy appriciated.  Symmetrical Chest wall movement, Good air movement bilaterally, CTAB RRR,No Gallops,Rubs or new Murmurs, No Parasternal Heave +ve B.Sounds, Abd Soft, Non tender, No organomegaly appriciated, No rebound -guarding or rigidity. No Cyanosis, Clubbing or edema, No new Rash or bruise   PERTINENT RADIOLOGIC STUDIES: DG CHEST PORT 1 VIEW  Result Date: 05/09/2019 CLINICAL DATA:  COVID-19 positive. Tachycardia. EXAM: PORTABLE CHEST 1 VIEW COMPARISON:  05/08/2019 FINDINGS: Worsening streaky opacities in the right lung. Mild left lung opacities are unchanged. No pleural effusion. IMPRESSION: Worsening right and unchanged left lung opacities, likely infection. Electronically Signed   By: Ulyses Jarred M.D.   On: 05/09/2019 01:57   DG CHEST PORT 1 VIEW  Result Date: 05/06/2019 CLINICAL DATA:  Cough, shortness of breath. EXAM: PORTABLE CHEST 1 VIEW COMPARISON:  May 05, 2019. FINDINGS: The heart size and mediastinal contours are within normal limits. Stable bilateral lung opacities are noted consistent with multifocal pneumonia. No pneumothorax or pleural effusion is noted. The visualized skeletal structures are unremarkable. IMPRESSION: Stable bilateral lung opacities are noted consistent with multifocal pneumonia. Electronically Signed   By: Marijo Conception M.D.   On: 05/06/2019 09:17   DG Chest Port 1 View  Result Date: 05/05/2019 CLINICAL DATA:  COVID pneumonia EXAM: PORTABLE CHEST 1 VIEW COMPARISON:  May 03, 2019 FINDINGS: The heart size and mediastinal contours are within normal limits.  Overall shallow degree of aeration. There is hazy/patchy airspace opacity seen within the right lower lung  and periphery of the left lung base. No pleural effusion. No acute osseous abnormality. IMPRESSION: Slight interval worsening in the multifocal airspace opacities. Electronically Signed   By: Prudencio Pair M.D.   On: 05/05/2019 01:32   DG Chest Port 1V same Day  Result Date: 05/08/2019 CLINICAL DATA:  Shortness of breath, cough. EXAM: PORTABLE CHEST 1 VIEW COMPARISON:  May 06, 2019. FINDINGS: The heart size and mediastinal contours are within normal limits. No pneumothorax or pleural effusion is noted. Mildly increased bibasilar lung opacities are noted concerning for worsening atelectasis or pneumonia. The visualized skeletal structures are unremarkable. IMPRESSION: Mildly increased bibasilar lung opacities are noted concerning for worsening atelectasis or pneumonia. Electronically Signed   By: Marijo Conception M.D.   On: 05/08/2019 09:16     PERTINENT LAB RESULTS: CBC: Recent Labs    05/10/19 0512 05/11/19 0640  WBC 12.3* 13.6*  HGB 14.1 13.9  HCT 42.5 42.5  PLT 433* 482*   CMET CMP     Component Value Date/Time   NA 137 05/12/2019 0510   K 4.1 05/12/2019 0510   CL 103 05/12/2019 0510   CO2 25 05/12/2019 0510   GLUCOSE 174 (H) 05/12/2019 0510   BUN 19 05/12/2019 0510   CREATININE 1.53 (H) 05/12/2019 0510   CALCIUM 8.5 (L) 05/12/2019 0510   PROT 5.5 (L) 05/11/2019 0640   ALBUMIN 2.4 (L) 05/11/2019 0640   AST 15 05/11/2019 0640   ALT 58 (H) 05/11/2019 0640   ALKPHOS 72 05/11/2019 0640   BILITOT 0.6 05/11/2019 0640   GFRNONAA 42 (L) 05/12/2019 0510   GFRAA 48 (L) 05/12/2019 0510    GFR Estimated Creatinine Clearance: 58.7 mL/min (A) (by C-G formula based on SCr of 1.53 mg/dL (H)). No results for input(s): LIPASE, AMYLASE in the last 72 hours. No results for input(s): CKTOTAL, CKMB, CKMBINDEX, TROPONINI in the last 72 hours. Invalid input(s): [place]    05/10/19 0512 05/11/19 0640  DDIMER <0.27 <0.27   No results for input(s): HGBA1C in the last 72  hours. No results for input(s): CHOL, HDL, LDLCALC, TRIG, CHOLHDL, LDLDIRECT in the last 72 hours. No results for input(s): TSH, T4TOTAL, T3FREE, THYROIDAB in the last 72 hours.  Invalid input(s): FREET3 Recent Labs    05/10/19 0512 05/11/19 0640  FERRITIN 109 90   Coags: No results for input(s): INR in the last 72 hours.  Invalid input(s): PT Microbiology: Recent Results (from the past 240 hour(s))  Blood Culture (routine x 2)     Status: None   Collection Time: 05/05/19  1:30 AM   Specimen: BLOOD  Result Value Ref Range Status   Specimen Description   Final    BLOOD RIGHT ANTECUBITAL Performed at [facility], [address] [place], [place] [address]    Special Requests   Final    BOTTLES DRAWN AEROBIC AND ANAEROBIC Blood Culture adequate volume Performed at [facility], [address] [place], [place] [address]    Culture   Final    NO GROWTH 5 DAYS Performed at [facility], [place] [address] [place], [place] [address]    Report Status 05/10/2019 FINAL  Final  Blood Culture (routine x 2)     Status: None   Collection Time: 05/05/19  1:35 AM   Specimen: BLOOD  Result Value Ref Range Status   Specimen  Description   Final    BLOOD LEFT ANTECUBITAL Performed at [facility], [address] [place], [place] [address]    Special Requests   Final    BOTTLES DRAWN AEROBIC AND ANAEROBIC Blood Culture adequate volume Performed at [facility], [address] [place], [place] [address]    Culture   Final    NO GROWTH 5 DAYS Performed at [facility], [place] [address] [place], [place] [address]    Report Status 05/10/2019 FINAL  Final  SARS Coronavirus 2 Ag (30 min TAT) - Nasal Swab (BD Veritor Kit)     Status: Abnormal   Collection Time: 05/05/19  5:17 AM   Specimen: Nasal Swab (BD Veritor Kit)  Result Value Ref Range Status   SARS Coronavirus 2 Ag POSITIVE (A) NEGATIVE Final    Comment: RESULT CALLED TO, READ  BACK BY AND VERIFIED WITH: LUBECK,E AT [id number] ON [id number] BY CHERESNOWSKY,T (NOTE) SARS-CoV-2 antigen PRESENT. Positive results indicate the presence of viral antigens, but clinical correlation with patient history and other diagnostic information is necessary to determine patient infection status.  Positive results do not rule out bacterial infection or co-infection  with other viruses. False positive results are rare but can occur, and confirmatory RT-PCR testing may be appropriate in some circumstances. The expected result is Negative. Fact Sheet for Patients: PodPark.tn Fact Sheet for Providers: GiftContent.is  This test is not yet approved or cleared by the [place] FDA and  has been authorized for detection and/or diagnosis of SARS-CoV-2 by FDA under an Emergency Use Authorization (EUA).  This EUA will remain in effect (meaning this test can be used) for the duration of  the COVID-1 9 declaration under Section 564(b)(1) of the Act, 21 U.S.C. section 360bbb-3(b)(1), unless the authorization is terminated or revoked sooner. Performed at [facility], [address] [place], [place] [address]     FURTHER DISCHARGE INSTRUCTIONS:  Get Medicines reviewed and adjusted: Please take all your medications with you for your next visit with your Primary MD  Laboratory/radiological data: Please request your Primary MD to go over all hospital tests and procedure/radiological results at the follow up, please ask your Primary MD to get all Hospital records sent to his/her office.  In some cases, they will be blood work, cultures and biopsy results pending at the time of your discharge. Please request that your primary care M.D. goes through all the records of your hospital data and follows up on these results.  Also Note the following: If you experience worsening of your admission symptoms, develop shortness of breath,  life threatening emergency, suicidal or homicidal thoughts you must seek medical attention immediately by calling 911 or calling your MD immediately  if symptoms less severe.  You must read complete instructions/literature along with all the possible adverse reactions/side effects for all the Medicines you take and that have been prescribed to you. Take any new Medicines after you have completely understood and accpet all the possible adverse reactions/side effects.   Do not drive when taking Pain medications or sleeping medications (Benzodaizepines)  Do not take more than prescribed Pain, Sleep and Anxiety Medications. It is not advisable to combine anxiety,sleep and pain medications without talking with your primary care practitioner  Special Instructions: If you have smoked or chewed Tobacco  in the last 2 yrs please stop smoking, stop any regular Alcohol  and or any Recreational drug use.  Wear Seat belts  while driving.  Please note: You were cared for by a hospitalist during your hospital stay. Once you are discharged, your primary care physician will handle any further medical issues. Please note that NO REFILLS for any discharge medications will be authorized once you are discharged, as it is imperative that you return to your primary care physician (or establish a relationship with a primary care physician if you do not have one) for your post hospital discharge needs so that they can reassess your need for medications and monitor your lab values.  Total Time spent coordinating discharge including counseling, education and face to face time equals  45 minutes.  SignedOren Binet 05/12/2019 9:33 AM

## 2019-05-12 NOTE — Progress Notes (Signed)
SATURATION QUALIFICATIONS: (This note is used to comply with regulatory documentation for home oxygen)  Patient Saturations on Room Air at Rest = 98%  Patient Saturations on Room Air while Ambulating = 84%  Patient Saturations on 2 Liters of oxygen while Ambulating = 93%  Please briefly explain why patient needs home oxygen: Pt requiring O2

## 2019-05-13 ENCOUNTER — Telehealth: Payer: Self-pay | Admitting: *Deleted

## 2019-05-13 NOTE — Care Management (Signed)
CM received call from pt/pts mom requesting FMLA paper work.  Paperwork to be completed for pt's mom.secondary to pts recent hospitalization.   Pt in agreement for paperwork to completed by attending. Dr Jerral Ralph in agreement to complete.   Forms faxed to [phone] as requested.

## 2019-05-20 ENCOUNTER — Encounter (HOSPITAL_COMMUNITY): Payer: Self-pay | Admitting: Internal Medicine

## 2019-05-20 ENCOUNTER — Emergency Department (HOSPITAL_COMMUNITY): Payer: Medicaid Other

## 2019-05-20 ENCOUNTER — Inpatient Hospital Stay (HOSPITAL_COMMUNITY)
Admission: EM | Admit: 2019-05-20 | Discharge: 2019-05-30 | DRG: 871 | Disposition: A | Discharge status: Home / Self Care | Payer: Medicaid Other | Attending: Family Medicine | Admitting: Family Medicine

## 2019-05-20 ENCOUNTER — Other Ambulatory Visit: Payer: Self-pay

## 2019-05-20 DIAGNOSIS — J90 Pleural effusion, not elsewhere classified: Secondary | ICD-10-CM

## 2019-05-20 DIAGNOSIS — Z885 Allergy status to narcotic agent status: Secondary | ICD-10-CM

## 2019-05-20 DIAGNOSIS — E782 Mixed hyperlipidemia: Secondary | ICD-10-CM | POA: Diagnosis present

## 2019-05-20 DIAGNOSIS — R0602 Shortness of breath: Secondary | ICD-10-CM

## 2019-05-20 DIAGNOSIS — J9601 Acute respiratory failure with hypoxia: Secondary | ICD-10-CM | POA: Diagnosis present

## 2019-05-20 DIAGNOSIS — J159 Unspecified bacterial pneumonia: Secondary | ICD-10-CM | POA: Diagnosis present

## 2019-05-20 DIAGNOSIS — A419 Sepsis, unspecified organism: Principal | ICD-10-CM

## 2019-05-20 DIAGNOSIS — J189 Pneumonia, unspecified organism: Secondary | ICD-10-CM | POA: Diagnosis present

## 2019-05-20 DIAGNOSIS — Z79899 Other long term (current) drug therapy: Secondary | ICD-10-CM | POA: Diagnosis not present

## 2019-05-20 DIAGNOSIS — I2510 Atherosclerotic heart disease of native coronary artery without angina pectoris: Secondary | ICD-10-CM | POA: Diagnosis present

## 2019-05-20 DIAGNOSIS — J45901 Unspecified asthma with (acute) exacerbation: Secondary | ICD-10-CM

## 2019-05-20 DIAGNOSIS — Z9851 Tubal ligation status: Secondary | ICD-10-CM

## 2019-05-20 DIAGNOSIS — I252 Old myocardial infarction: Secondary | ICD-10-CM

## 2019-05-20 DIAGNOSIS — Z6836 Body mass index (BMI) 36.0-36.9, adult: Secondary | ICD-10-CM | POA: Diagnosis not present

## 2019-05-20 DIAGNOSIS — E669 Obesity, unspecified: Secondary | ICD-10-CM | POA: Diagnosis present

## 2019-05-20 DIAGNOSIS — N1831 Chronic kidney disease, stage 3a: Secondary | ICD-10-CM | POA: Diagnosis present

## 2019-05-20 DIAGNOSIS — U071 COVID-19: Secondary | ICD-10-CM | POA: Diagnosis not present

## 2019-05-20 DIAGNOSIS — Z955 Presence of coronary angioplasty implant and graft: Secondary | ICD-10-CM | POA: Diagnosis not present

## 2019-05-20 DIAGNOSIS — I5020 Unspecified systolic (congestive) heart failure: Secondary | ICD-10-CM | POA: Diagnosis present

## 2019-05-20 DIAGNOSIS — I130 Hypertensive heart and chronic kidney disease with heart failure and stage 1 through stage 4 chronic kidney disease, or unspecified chronic kidney disease: Secondary | ICD-10-CM | POA: Diagnosis present

## 2019-05-20 DIAGNOSIS — Z8616 Personal history of COVID-19: Secondary | ICD-10-CM | POA: Diagnosis present

## 2019-05-20 DIAGNOSIS — I10 Essential (primary) hypertension: Secondary | ICD-10-CM | POA: Diagnosis present

## 2019-05-20 DIAGNOSIS — R0603 Acute respiratory distress: Secondary | ICD-10-CM

## 2019-05-20 DIAGNOSIS — Z7982 Long term (current) use of aspirin: Secondary | ICD-10-CM

## 2019-05-20 DIAGNOSIS — I5021 Acute systolic (congestive) heart failure: Secondary | ICD-10-CM | POA: Diagnosis not present

## 2019-05-20 DIAGNOSIS — J1282 Pneumonia due to coronavirus disease 2019: Secondary | ICD-10-CM | POA: Diagnosis not present

## 2019-05-20 DIAGNOSIS — R0902 Hypoxemia: Secondary | ICD-10-CM

## 2019-05-20 LAB — CBC
HCT: 39.5 % (ref 36.0–46.0)
Hemoglobin: 12.3 g/dL (ref 12.0–15.0)
MCH: 28.9 pg (ref 26.0–34.0)
MCHC: 31.1 g/dL (ref 30.0–36.0)
MCV: 92.7 fL (ref 80.0–100.0)
Platelets: 374 10*3/uL (ref 150–400)
RBC: 4.26 MIL/uL (ref 3.87–5.11)
RDW: 14.5 % (ref 11.5–15.5)
WBC: 13.6 10*3/uL — ABNORMAL HIGH (ref 4.0–10.5)
nRBC: 0 % (ref 0.0–0.2)

## 2019-05-20 LAB — POCT I-STAT 7, (LYTES, BLD GAS, ICA,H+H)
Acid-Base Excess: 2 mmol/L (ref 0.0–2.0)
Bicarbonate: 24.9 mmol/L (ref 20.0–28.0)
Calcium, Ion: 1.25 mmol/L (ref 1.15–1.40)
HCT: 38 % (ref 36.0–46.0)
Hemoglobin: 12.9 g/dL (ref 12.0–15.0)
O2 Saturation: 96 %
Patient temperature: 98.7
Potassium: 4.1 mmol/L (ref 3.5–5.1)
Sodium: 136 mmol/L (ref 135–145)
TCO2: 26 mmol/L (ref 22–32)
pCO2 arterial: 33.9 mmHg (ref 32.0–48.0)
pH, Arterial: 7.475 — ABNORMAL HIGH (ref 7.350–7.450)
pO2, Arterial: 76 mmHg — ABNORMAL LOW (ref 83.0–108.0)

## 2019-05-20 LAB — LACTIC ACID, PLASMA: Lactic Acid, Venous: 1.5 mmol/L (ref 0.5–1.9)

## 2019-05-20 LAB — BASIC METABOLIC PANEL
Anion gap: 12 (ref 5–15)
BUN: 6 mg/dL (ref 6–20)
CO2: 24 mmol/L (ref 22–32)
Calcium: 8.7 mg/dL — ABNORMAL LOW (ref 8.9–10.3)
Chloride: 105 mmol/L (ref 98–111)
Creatinine, Ser: 1.36 mg/dL — ABNORMAL HIGH (ref 0.44–1.00)
GFR calc Af Amer: 56 mL/min — ABNORMAL LOW (ref >60)
GFR calc non Af Amer: 48 mL/min — ABNORMAL LOW (ref >60)
Glucose, Bld: 144 mg/dL — ABNORMAL HIGH (ref 70–99)
Potassium: 4.5 mmol/L (ref 3.5–5.1)
Sodium: 141 mmol/L (ref 135–145)

## 2019-05-20 LAB — TROPONIN I (HIGH SENSITIVITY)
Troponin I (High Sensitivity): 9 ng/L (ref <18)
Troponin I (High Sensitivity): 8 ng/L (ref <18)

## 2019-05-20 LAB — I-STAT BETA HCG BLOOD, ED (MC, WL, AP ONLY)
I-stat hCG, quantitative: <5 m[IU]/mL (ref <5)
I-stat hCG, quantitative: <5 m[IU]/mL (ref <5)

## 2019-05-20 MED ORDER — ACETAMINOPHEN 650 MG RE SUPP: 650.0000 mg | Freq: Four times a day (QID) | RECTAL | Status: DC | PRN

## 2019-05-20 MED ORDER — ALBUTEROL SULFATE HFA 108 (90 BASE) MCG/ACT IN AERS
8.0000 | INHALATION_SPRAY | Freq: Once | RESPIRATORY_TRACT | Status: AC
  Administered 2019-05-20: 8 via RESPIRATORY_TRACT
  Filled 2019-05-20: qty 6.7

## 2019-05-20 MED ORDER — ALBUTEROL SULFATE HFA 108 (90 BASE) MCG/ACT IN AERS
2.0000 | INHALATION_SPRAY | Freq: Once | RESPIRATORY_TRACT | Status: AC
  Administered 2019-05-20: 2 via RESPIRATORY_TRACT

## 2019-05-20 MED ORDER — ASPIRIN EC 81 MG PO TBEC
81.0000 mg | DELAYED_RELEASE_TABLET | Freq: Every day | ORAL | Status: DC
  Administered 2019-05-21 – 2019-05-30 (×10): 81 mg via ORAL
  Filled 2019-05-20 (×10): qty 1

## 2019-05-20 MED ORDER — SERTRALINE HCL 50 MG PO TABS
50.0000 mg | ORAL_TABLET | Freq: Every day | ORAL | Status: DC
  Administered 2019-05-21 – 2019-05-30 (×10): 50 mg via ORAL
  Filled 2019-05-20 (×11): qty 1

## 2019-05-20 MED ORDER — POLYETHYLENE GLYCOL 3350 17 G PO PACK: 17.0000 g | PACK | Freq: Every day | ORAL | Status: DC | PRN

## 2019-05-20 MED ORDER — NITROGLYCERIN 0.4 MG SL SUBL: 0.4000 mg | SUBLINGUAL_TABLET | SUBLINGUAL | Status: DC | PRN

## 2019-05-20 MED ORDER — IOHEXOL 350 MG/ML SOLN
100.0000 mL | Freq: Once | INTRAVENOUS | Status: AC | PRN
  Administered 2019-05-20: 100 mL via INTRAVENOUS

## 2019-05-20 MED ORDER — ATORVASTATIN CALCIUM 80 MG PO TABS
80.0000 mg | ORAL_TABLET | Freq: Every day | ORAL | Status: DC
  Administered 2019-05-21 – 2019-05-30 (×10): 80 mg via ORAL
  Filled 2019-05-20 (×10): qty 1

## 2019-05-20 MED ORDER — ACETAMINOPHEN 325 MG PO TABS
650.0000 mg | ORAL_TABLET | Freq: Four times a day (QID) | ORAL | Status: DC | PRN
  Administered 2019-05-21 – 2019-05-30 (×9): 650 mg via ORAL
  Filled 2019-05-20 (×9): qty 2

## 2019-05-20 MED ORDER — MAGNESIUM SULFATE 2 GM/50ML IV SOLN
2.0000 g | Freq: Once | INTRAVENOUS | Status: AC
  Administered 2019-05-20: 2 g via INTRAVENOUS
  Filled 2019-05-20: qty 50

## 2019-05-20 MED ORDER — SODIUM CHLORIDE 0.9 % IV SOLN
500.0000 mg | Freq: Once | INTRAVENOUS | Status: AC
  Administered 2019-05-20: 500 mg via INTRAVENOUS
  Filled 2019-05-20: qty 500

## 2019-05-20 MED ORDER — SODIUM CHLORIDE 0.9 % IV SOLN
2.0000 g | INTRAVENOUS | Status: AC
  Administered 2019-05-21 – 2019-05-26 (×6): 2 g via INTRAVENOUS
  Filled 2019-05-20 (×2): qty 2
  Filled 2019-05-20: qty 20
  Filled 2019-05-20 (×3): qty 2

## 2019-05-20 MED ORDER — ONDANSETRON HCL 4 MG/2ML IJ SOLN: 4.0000 mg | Freq: Four times a day (QID) | INTRAMUSCULAR | Status: DC | PRN

## 2019-05-20 MED ORDER — SODIUM CHLORIDE 0.9 % IV SOLN
500.0000 mg | INTRAVENOUS | Status: AC
  Administered 2019-05-21 – 2019-05-26 (×6): 500 mg via INTRAVENOUS
  Filled 2019-05-20 (×7): qty 500

## 2019-05-20 MED ORDER — IPRATROPIUM BROMIDE HFA 17 MCG/ACT IN AERS
2.0000 | INHALATION_SPRAY | Freq: Once | RESPIRATORY_TRACT | Status: AC
  Administered 2019-05-20: 2 via RESPIRATORY_TRACT
  Filled 2019-05-20: qty 12.9

## 2019-05-20 MED ORDER — IPRATROPIUM BROMIDE HFA 17 MCG/ACT IN AERS
2.0000 | INHALATION_SPRAY | Freq: Once | RESPIRATORY_TRACT | Status: AC
  Administered 2019-05-20: 2 via RESPIRATORY_TRACT

## 2019-05-20 MED ORDER — METHYLPREDNISOLONE SODIUM SUCC 125 MG IJ SOLR
125.0000 mg | Freq: Once | INTRAMUSCULAR | Status: AC
  Administered 2019-05-20: 125 mg via INTRAVENOUS
  Filled 2019-05-20: qty 2

## 2019-05-20 MED ORDER — CARVEDILOL 6.25 MG PO TABS
6.2500 mg | ORAL_TABLET | Freq: Two times a day (BID) | ORAL | Status: DC
  Administered 2019-05-20 – 2019-05-30 (×20): 6.25 mg via ORAL
  Filled 2019-05-20 (×20): qty 1

## 2019-05-20 MED ORDER — ENOXAPARIN SODIUM 40 MG/0.4ML ~~LOC~~ SOLN
40.0000 mg | Freq: Every day | SUBCUTANEOUS | Status: DC
  Administered 2019-05-21 – 2019-05-24 (×4): 40 mg via SUBCUTANEOUS
  Filled 2019-05-20 (×5): qty 0.4

## 2019-05-20 MED ORDER — SODIUM CHLORIDE 0.9 % IV BOLUS
1000.0000 mL | Freq: Once | INTRAVENOUS | Status: AC
  Administered 2019-05-20: 1000 mL via INTRAVENOUS

## 2019-05-20 MED ORDER — ONDANSETRON HCL 4 MG PO TABS: 4.0000 mg | ORAL_TABLET | Freq: Four times a day (QID) | ORAL | Status: DC | PRN

## 2019-05-20 MED ORDER — LACTATED RINGERS IV SOLN: INTRAVENOUS | Status: DC

## 2019-05-20 MED ORDER — ALBUTEROL SULFATE HFA 108 (90 BASE) MCG/ACT IN AERS
2.0000 | INHALATION_SPRAY | RESPIRATORY_TRACT | Status: DC | PRN
  Administered 2019-05-21: 2 via RESPIRATORY_TRACT
  Filled 2019-05-20: qty 6.7

## 2019-05-20 MED ORDER — SODIUM CHLORIDE 0.9 % IV SOLN
2.0000 g | Freq: Once | INTRAVENOUS | Status: AC
  Administered 2019-05-20: 2 g via INTRAVENOUS
  Filled 2019-05-20: qty 20

## 2019-05-20 MED ORDER — ALBUTEROL SULFATE (2.5 MG/3ML) 0.083% IN NEBU: 2.5000 mg | INHALATION_SOLUTION | RESPIRATORY_TRACT | Status: DC | PRN

## 2019-05-20 MED ORDER — SODIUM CHLORIDE 0.9% FLUSH
3.0000 mL | Freq: Once | INTRAVENOUS | Status: AC
  Administered 2019-05-26: 3 mL via INTRAVENOUS

## 2019-05-20 MED ORDER — FENTANYL CITRATE (PF) 100 MCG/2ML IJ SOLN
25.0000 ug | Freq: Once | INTRAMUSCULAR | Status: AC
  Administered 2019-05-20: 25 ug via INTRAVENOUS
  Filled 2019-05-20: qty 2

## 2019-05-20 MED ORDER — BENZONATATE 100 MG PO CAPS
200.0000 mg | ORAL_CAPSULE | Freq: Three times a day (TID) | ORAL | Status: DC | PRN
  Administered 2019-05-21 – 2019-05-29 (×4): 200 mg via ORAL
  Filled 2019-05-20 (×5): qty 2

## 2019-05-20 NOTE — H&P (Signed)
History and Physical    Maria Bradford ALP:[id number] DOB: Feb 16, 1980 DOA: 05/20/2019  PCP: Burnis Medin, PA-C  Patient coming from: Home   Chief Complaint:  Chief Complaint  Patient presents with  . Shortness of Breath     HPI:  39 year old female with past medical history of coronary artery disease (inferior wall STEMI 05/2016, S/P angioplasty and stenting of RCA), VFib (post-STEMI complication, S/P Cardioversion x 3), hypertension, hyperlipidemia, obesity and recent diagnosis with COVID-19 on 4/19 requiring hospitalization at [facility] and discharged on 5/3 who presents to [facility] emergency department with complaints of progressively worsening shortness of breath.  Patient explains that since her discharge from [facility] on 5/3 she has developed progressively worsening shortness of breath.  In the past several days, the shortness of breath has been severe in intensity, worse with minimal exertion and improved with rest.  Shortness of breath is associated with worsening cough, productive of white to clear sputum.  Cough has been increasing in intensity and sputum production.  Patient is also complaining of associated chest pain.  Patient describes the chest pain as located in the anterior chest wall, radiating diffusely and occurring with deep inspiration and cough.  As patient symptoms continue to worsen patient is also been complaining of generalized weakness as well as lack of appetite.  Patient denies any fevers.  Patient states that she has completed her ongoing course of steroids prescribed to her on 5/3.  Patient reports that she has also been using her supplemental oxygen as instructed.  Patient presented to see her primary care provider on 5/11 where upon clinical evaluation the patient was directed to go to [facility] emergency department for evaluation.  Upon evaluation in the emergency department, patient was found to have persisting extensive bilateral  infiltrates on chest imaging with evidence of increasing oxygen requirement (now on 4lpm in the ED), and tachycardia concerning for possible sepsis secondary to superimposed bilateral bacterial pneumonia.  Of note, 21-day period of isolation ended on 5/10.  Patient was initiated on intravenous ceftriaxone and azithromycin in the hospital prescription has been called to assess the patient for admission to the hospital.    Review of Systems: A 10-system review of systems has been performed and all systems are negative with the exception of what is listed in the HPI.    Past Medical History:  Diagnosis Date  . Coronary artery disease   . Hypertension     Past Surgical History:  Procedure Laterality Date  . CESAREAN SECTION    . CORONARY ANGIOPLASTY WITH STENT PLACEMENT    . TUBAL LIGATION       reports that she has never smoked. She has never used smokeless tobacco. She reports that she does not drink alcohol or use drugs.  Allergies  Allergen Reactions  . Oxycodone-Acetaminophen Nausea And Vomiting  . Oxycodone Nausea And Vomiting    History reviewed. No pertinent family history.   Prior to Admission medications   Medication Sig Start Date End Date Taking? Authorizing Provider  acetaminophen (TYLENOL) 500 MG tablet Take 1,000 mg by mouth every 6 (six) hours as needed for fever or headache (pain). For pain   Yes [provider]  albuterol (VENTOLIN HFA) 108 (90 Base) MCG/ACT inhaler Inhale 2 puffs into the lungs every 6 (six) hours as needed. Patient taking differently: Inhale 2 puffs into the lungs every 6 (six) hours as needed for wheezing or shortness of breath.  05/12/19  Yes Ghimire, Werner Lean, MD  aspirin EC 81 MG tablet Take 81 mg by mouth daily.   Yes [provider]  benzonatate (TESSALON) 200 MG capsule Take 1 capsule (200 mg total) by mouth 3 (three) times daily as needed for cough. 05/12/19  Yes Ghimire, Henreitta Leber, MD  carvedilol (COREG) 6.25 MG tablet  Take 1 tablet (6.25 mg total) by mouth 2 (two) times daily. 05/12/19 06/11/19 Yes Ghimire, Henreitta Leber, MD  etonogestrel (NEXPLANON) 68 MG IMPL implant 1 each by Subdermal route continuous. Implanted 2020    Yes [provider]  Multiple Vitamins-Minerals (EMERGEN-C IMMUNE PLUS/VIT D) CHEW Chew 1 tablet by mouth 2 (two) times daily.   Yes [provider]  sertraline (ZOLOFT) 50 MG tablet Take 50 mg by mouth daily. 04/28/19  Yes [provider]  dexamethasone (DECADRON) 6 MG tablet Take 1 tablet (6 mg total) by mouth daily. Patient not taking: Reported on 05/20/2019 05/13/19   Jonetta Osgood, MD  hydroxypropyl methylcellulose (ISOPTO TEARS) 2.5 % ophthalmic solution Place 1 drop into both eyes 4 (four) times daily as needed for dry eyes. Patient not taking: Reported on 05/20/2019 12/04/12   Ricke Hey, MD    Physical Exam: Vitals:   05/20/19 1900 05/20/19 1930 05/20/19 2120 05/20/19 2200  BP: (!) 144/99 (!) 150/95 (!) 131/93 131/77  Pulse: (!) 116 (!) 107 (!) 108 (!) 108  Resp: 20 19 (!) 25   Temp:      TempSrc:      SpO2:  94% 94% 94%  Weight:      Height:        Constitutional: Acute alert and oriented x3, patient is in respiratory distress patient is obese. Skin: no rashes, no lesions, notable poor skin turgor. Eyes: Pupils are equally reactive to light.  No evidence of scleral icterus or conjunctival pallor.  ENMT: Dry mucous membranes noted.  Posterior pharynx clear of any exudate or lesions.   Neck: normal, supple, no masses, no thyromegaly.  No evidence of jugular venous distension.   Respiratory: Scattered rhonchi bilaterally with coarse breath sounds noted throughout the right lung fields.  Minimal expiratory wheezing noted.  No rales noted.  Patient is tachypneic without any evidence of accessory muscle use.   Cardiovascular: Tachycardic rate with regular rhythm.  No murmurs / rubs / gallops. No extremity edema. 2+ pedal pulses. No carotid  bruits.  Chest:   Nontender without crepitus or deformity.   Back:   Nontender without crepitus or deformity. Abdomen: Abdomen is protuberant but soft and nontender.  No evidence of intra-abdominal masses.  Positive bowel sounds noted in all quadrants.   Musculoskeletal: No joint deformity upper and lower extremities. Good ROM, no contractures. Normal muscle tone.  Neurologic: CN 2-12 grossly intact. Sensation intact, strength noted to be 5 out of 5 in all 4 extremities.  Patient is following all commands.  Patient is responsive to verbal stimuli.   Psychiatric: Patient presents as a normal mood with appropriate affect.  Patient seems to possess insight as to theircurrent situation.     Labs on Admission: I have personally reviewed following labs and imaging studies -   CBC: Recent Labs  Lab 05/20/19 1155 05/20/19 2204  WBC 13.6*  --   HGB 12.3 12.9  HCT 39.5 38.0  MCV 92.7  --   PLT 374  --    Basic Metabolic Panel: Recent Labs  Lab 05/20/19 1155 05/20/19 2204  NA 141 136  K 4.5 4.1  CL 105  --  CO2 24  --   GLUCOSE 144*  --   BUN 6  --   CREATININE 1.36*  --   CALCIUM 8.7*  --    GFR: Estimated Creatinine Clearance: 66 mL/min (A) (by C-G formula based on SCr of 1.36 mg/dL (H)). Liver Function Tests: No results for input(s): AST, ALT, ALKPHOS, BILITOT, PROT, ALBUMIN in the last 168 hours. No results for input(s): LIPASE, AMYLASE in the last 168 hours. No results for input(s): AMMONIA in the last 168 hours. Coagulation Profile: No results for input(s): INR, PROTIME in the last 168 hours. Cardiac Enzymes: No results for input(s): CKTOTAL, CKMB, CKMBINDEX, TROPONINI in the last 168 hours. BNP (last 3 results) No results for input(s): PROBNP in the last 8760 hours. HbA1C: No results for input(s): HGBA1C in the last 72 hours. CBG: No results for input(s): GLUCAP in the last 168 hours. Lipid Profile: No results for input(s): CHOL, HDL, LDLCALC, TRIG, CHOLHDL,  LDLDIRECT in the last 72 hours. Thyroid Function Tests: No results for input(s): TSH, T4TOTAL, FREET4, T3FREE, THYROIDAB in the last 72 hours. Anemia Panel: No results for input(s): VITAMINB12, FOLATE, FERRITIN, TIBC, IRON, RETICCTPCT in the last 72 hours. Urine analysis:    Component Value Date/Time   COLORURINE YELLOW 01/16/2014 0912   APPEARANCEUR CLOUDY (A) 01/16/2014 0912   LABSPEC 1.024 01/16/2014 0912   PHURINE 6.0 01/16/2014 0912   GLUCOSEU NEGATIVE 01/16/2014 0912   HGBUR NEGATIVE 01/16/2014 0912   BILIRUBINUR NEGATIVE 01/16/2014 0912   KETONESUR NEGATIVE 01/16/2014 0912   PROTEINUR 30 (A) 01/16/2014 0912   UROBILINOGEN 1.0 01/16/2014 0912   NITRITE NEGATIVE 01/16/2014 0912   LEUKOCYTESUR LARGE (A) 01/16/2014 0912    Radiological Exams on Admission - Personally Reviewed: DG Chest 2 View  Result Date: 05/20/2019 CLINICAL DATA:  Chest pain EXAM: CHEST - 2 VIEW COMPARISON:  05/09/2018 FINDINGS: Diffuse bilateral airspace disease, right greater than left again noted, unchanged. Low lung volumes. Heart is normal size. No visible effusions or pneumothorax. No acute bony abnormality. IMPRESSION: Severe diffuse bilateral airspace disease, right greater than left most compatible with pneumonia. No change. Electronically Signed   By: Charlett Nose M.D.   On: 05/20/2019 12:51   CT Angio Chest PE W/Cm &/Or Wo Cm  Result Date: 05/20/2019 CLINICAL DATA:  COVID-19 pneumonia, shortness of breath EXAM: CT ANGIOGRAPHY CHEST WITH CONTRAST TECHNIQUE: Multidetector CT imaging of the chest was performed using the standard protocol during bolus administration of intravenous contrast. Multiplanar CT image reconstructions and MIPs were obtained to evaluate the vascular anatomy. CONTRAST:  OMNIPAQUE IOHEXOL 350 MG/ML SOLN COMPARISON:  09/18/2016, 05/20/2019 FINDINGS: Cardiovascular: This is a technically adequate evaluation of the pulmonary vasculature. There are no filling defects or pulmonary  emboli. Heart is unremarkable without pericardial effusion. Thoracic aorta is grossly normal without aneurysm or dissection. Mediastinum/Nodes: Mediastinal and right hilar adenopathy likely reactive. The thyroid, trachea, and esophagus are normal. Lungs/Pleura: There is multifocal bilateral airspace disease, right greater than left, consistent with multifocal pneumonia. No effusion or pneumothorax. Central airways are patent. Upper Abdomen: No acute abnormality. Musculoskeletal: No acute or destructive bony lesions. Reconstructed images demonstrate no additional findings. Review of the MIP images confirms the above findings. IMPRESSION: 1. No evidence of pulmonary embolus. 2. Multifocal bilateral pneumonia consistent with COVID-19. Electronically Signed   By: Sharlet Salina M.D.   On: 05/20/2019 20:59    EKG: Personally reviewed.  Rhythm is sinus tachycardia with heart rate of 108 bpm.  No dynamic ST segment changes appreciated.  Assessment/Plan Principal Problem:   Sepsis due to pneumonia [facility])   Patient presenting with multiple sirs criteria in the setting of progressive acute hypoxic respiratory failure concerning for sepsis secondary to bilateral pneumonia.  While it has been greater than 22 days since the patient's diagnosis with COVID-19, I feel the patient's presentation is most likely secondary to bacterial pneumonia superimposed on recent diagnosis of Covid pneumonia.  Patient no longer requires isolation as patient is greater than 21 days out from diagnosis on 4/19  Providing patient with supplemental oxygen to maintain oxygen saturations of 94% or greater  Hydrating patient with intravenous isotonic fluids  Continuing intravenous antibiotic regimen with ceftriaxone and azithromycin.  Blood cultures have been ordered.  Sputum culture have been ordered.  Lactic acid has been ordered and is pending  Additionally, CRP and procalcitonin have been ordered.  ABG has been ordered and  is pending  Monitoring patient closely as patient is at risk of clinical decompensation  Active Problems:   Acute respiratory failure with hypoxia (HCC)   Please see assessment and plan above    Pneumonia of both lungs due to infectious organism   Please see assessment and plan above    History of COVID-19   Recent diagnosis of COVID-19 on 4/19 with hospitalization at [facility] for associated acute hypoxic respiratory failure  And was discharged home on 5/3 on supplemental oxygen of 2 L via nasal cannula  She did complete a course of remdesivir and dexamethasone  Mixed hyperlipidemia   History of mixed hyperlipidemia per my review of care everywhere with known history of coronary artery disease status post STEMI in 2018  Patient is currently not on statin therapy per my review of home medication reconciliation, will restart previously prescribed home regimen of atorvastatin 80 mg by mouth once daily based on previous cardiology notes  Coronary artery disease, native artery of native heart without angina pectoris   Patient is currently chest pain-free -periods of episodic chest discomfort are likely secondary to musculoskeletal pain  Continue home regimen of antiplatelet therapy  Monitoring patient on telemetry  Restarting home regimen of statin therapy  Continuing home regimen of beta-blocker  Urine nitroglycerin  Code Status:  Full code Family Communication: deferred   Status is: Inpatient  Remains inpatient appropriate because:Ongoing diagnostic testing needed not appropriate for outpatient work up, IV treatments appropriate due to intensity of illness or inability to take PO and Inpatient level of care appropriate due to severity of illness   Dispo: The patient is from: Home              Anticipated d/c is to: Home              Anticipated d/c date is: 3 days              Patient currently is not medically stable to d/c.         Marinda Elk MD [facility] Pager [phone]  If 7PM-7AM, please contact night-coverage [email or web address] Use universal [place] password for that web site. If you do not have the password, please call the hospital operator.  05/20/2019, 11:07 PM

## 2019-05-20 NOTE — ED Triage Notes (Signed)
Pt arrives to ED today after following up with pcp and told to come to ED due to sob following recent covid pneumonia and no better.Pt has be prescribed 2 L [place] at all times but labored breathing with sitting. Pt aldo reports chest tightness.

## 2019-05-20 NOTE — ED Provider Notes (Addendum)
[facility] EMERGENCY DEPARTMENT Provider Note   CSN: [id number] Arrival date & time: 05/20/19  1101     History Chief Complaint  Patient presents with   Shortness of Breath    Maria Bradford is a 39 y.o. female  with pertinent past medical history of coronary disease, hypertension, presents to the emergency department for shortness of breath.  Patient was just diagnosed with COVID-19 on 4/26 and admitted to the hospital for acute hypoxic respiratory failure secondary to COVID-19 pneumonia.  During her time at the hospital she had remdesivir and steroids.  She was titrated to room air on 5/2 but did require oxygen with ambulation.  She was discharged on 5/3.  Patient states that since then she has not gotten any better, has been requiring oxygen daily even not while walking.  States that she sometimes has to increase her oxygen up to 3 L.  States that has been significantly worse in the past couple of days.  She states that it is difficult to breathe because of how much her chest hurts under her breasts bilaterally.  She says every time she takes a deep breath she is in excruciating pain.  She states that she has to cough constantly, cannot get any air in.  Patient states that her chest feels tight in the center.  Patient denies any leg swelling, abdominal pain, nausea, vomiting, headache, dizziness, weakness, syncope.  Sister is in the room who states that she has noticed that she has not been eating properly due to her pain and that she has been staying awake because she cannot fall asleep due to her cough.  She is not spitting anything up with her cough.  Denies any fevers at home.  Does take albuterol as needed for her asthma.  Patient states that she does have asthma even though is not listed on her chart.  States that she has been having to use her inhaler more these past couple of days.  Does not take any other medications for asthma.  Was discharged on a 2-day course of  steroids per patient.  HPI     Past Medical History:  Diagnosis Date   Coronary artery disease    Hypertension     Patient Active Problem List   Diagnosis Date Noted   Pneumonia due to COVID-19 virus 05/05/2019   COVID-19 05/05/2019   Hypoxia     Past Surgical History:  Procedure Laterality Date   CESAREAN SECTION     CORONARY ANGIOPLASTY WITH STENT PLACEMENT     TUBAL LIGATION       OB History    Gravida  1   Para      Term      Preterm      AB      Living        SAB      TAB      Ectopic      Multiple      Live Births              No family history on file.  Social History   Tobacco Use   Smoking status: Never Smoker   Smokeless tobacco: Never Used  Substance Use Topics   Alcohol use: No   Drug use: No    Home Medications Prior to Admission medications   Medication Sig Start Date End Date Taking? Authorizing Provider  acetaminophen (TYLENOL) 500 MG tablet Take 1,000 mg by mouth every 6 (six) hours as  needed for fever or headache (pain). For pain   Yes [provider]  albuterol (VENTOLIN HFA) 108 (90 Base) MCG/ACT inhaler Inhale 2 puffs into the lungs every 6 (six) hours as needed. Patient taking differently: Inhale 2 puffs into the lungs every 6 (six) hours as needed for wheezing or shortness of breath.  05/12/19  Yes Ghimire, Henreitta Leber, MD  aspirin EC 81 MG tablet Take 81 mg by mouth daily.   Yes [provider]  benzonatate (TESSALON) 200 MG capsule Take 1 capsule (200 mg total) by mouth 3 (three) times daily as needed for cough. 05/12/19  Yes Ghimire, Henreitta Leber, MD  carvedilol (COREG) 6.25 MG tablet Take 1 tablet (6.25 mg total) by mouth 2 (two) times daily. 05/12/19 06/11/19 Yes Ghimire, Henreitta Leber, MD  etonogestrel (NEXPLANON) 68 MG IMPL implant 1 each by Subdermal route continuous. Implanted 2020    Yes [provider]  Multiple Vitamins-Minerals (EMERGEN-C IMMUNE PLUS/VIT D) CHEW Chew 1 tablet by  mouth 2 (two) times daily.   Yes [provider]  sertraline (ZOLOFT) 50 MG tablet Take 50 mg by mouth daily. 04/28/19  Yes [provider]  dexamethasone (DECADRON) 6 MG tablet Take 1 tablet (6 mg total) by mouth daily. Patient not taking: Reported on 05/20/2019 05/13/19   Jonetta Osgood, MD  hydroxypropyl methylcellulose (ISOPTO TEARS) 2.5 % ophthalmic solution Place 1 drop into both eyes 4 (four) times daily as needed for dry eyes. Patient not taking: Reported on 05/20/2019 12/04/12   Ricke Hey, MD    Allergies    Oxycodone-acetaminophen and Oxycodone  Review of Systems   Review of Systems  Constitutional: Negative for chills, diaphoresis, fatigue and fever.  HENT: Negative for congestion, sore throat and trouble swallowing.   Eyes: Negative for pain and visual disturbance.  Respiratory: Positive for cough, shortness of breath and wheezing.   Cardiovascular: Positive for chest pain. Negative for palpitations and leg swelling.  Gastrointestinal: Negative for abdominal distention, abdominal pain, diarrhea, nausea and vomiting.  Genitourinary: Negative for difficulty urinating.  Musculoskeletal: Negative for back pain, neck pain and neck stiffness.  Skin: Negative for pallor.  Neurological: Negative for dizziness, speech difficulty, weakness and headaches.  Psychiatric/Behavioral: Negative for confusion.    Physical Exam Updated Vital Signs BP (!) 150/95    Pulse (!) 107    Temp 98.2 F (36.8 C) (Oral)    Resp 19    Ht 5\' 6"  (1.676 m)    Wt 103.1 kg    SpO2 94%    BMI 36.69 kg/m   Physical Exam Constitutional:      General: She is in acute distress.     Appearance: Normal appearance. She is ill-appearing. She is not toxic-appearing or diaphoretic.     Comments: Patient is in respiratory distress, tachypneic to 54 when I was in the room.  Patient is using accessory muscles.  Patient was satting at 91%, increased her to 3 L of oxygen and her saturation  increased to 97%.  Patient can not  speak in full sentences due to tachypnea.   HENT:     Mouth/Throat:     Mouth: Mucous membranes are moist.     Pharynx: Oropharynx is clear.  Eyes:     General: No scleral icterus.    Extraocular Movements: Extraocular movements intact.     Pupils: Pupils are equal, round, and reactive to light.  Cardiovascular:     Rate and Rhythm: Normal rate and regular rhythm.  Pulses: Normal pulses.     Heart sounds: Normal heart sounds.  Pulmonary:     Effort: Tachypnea and respiratory distress present.     Breath sounds: No stridor. Decreased breath sounds, wheezing and rhonchi present. No rales.  Chest:     Chest wall: Tenderness (Patient is tender to entire anterior chest, specifically under bilateral breast.  No rashes noted.  Patient is tender to chest whenever she moves her hands or arms, patient is significantly tender when she inhales deeply.) present.  Abdominal:     General: Abdomen is flat. There is no distension.     Palpations: Abdomen is soft.     Tenderness: There is no abdominal tenderness. There is no guarding or rebound.  Musculoskeletal:        General: No swelling or tenderness. Normal range of motion.     Cervical back: Normal range of motion and neck supple. No rigidity.     Right lower leg: No edema.     Left lower leg: No edema.  Skin:    General: Skin is warm and dry.     Capillary Refill: Capillary refill takes less than 2 seconds.     Coloration: Skin is not pale.  Neurological:     General: No focal deficit present.     Mental Status: She is alert and oriented to person, place, and time.  Psychiatric:        Mood and Affect: Mood normal.        Behavior: Behavior normal.     ED Results / Procedures / Treatments   Labs (all labs ordered are listed, but only abnormal results are displayed) Labs Reviewed  BASIC METABOLIC PANEL - Abnormal; Notable for the following components:      Result Value   Glucose, Bld 144 (*)      Creatinine, Ser 1.36 (*)    Calcium 8.7 (*)    GFR calc non Af Amer 48 (*)    GFR calc Af Amer 56 (*)    All other components within normal limits  CBC - Abnormal; Notable for the following components:   WBC 13.6 (*)    All other components within normal limits  I-STAT BETA HCG BLOOD, ED ([facility], [facility], [facility] ONLY)  I-STAT BETA HCG BLOOD, ED ([facility], [facility], [facility] ONLY)  TROPONIN I (HIGH SENSITIVITY)  TROPONIN I (HIGH SENSITIVITY)    EKG EKG Interpretation  Date/Time:  Tuesday May 20 2019 11:55:19 EDT Ventricular Rate:  108 PR Interval:  120 QRS Duration: 74 QT Interval:  326 QTC Calculation: 436 R Axis:   13 Text Interpretation: Sinus tachycardia Otherwise normal ECG when compared to prior, faster rate. no STEMI Confirmed by Theda Belfastegeler, Chris ([id number]) on 05/20/2019 4:10:15 PM   Radiology DG Chest 2 View  Result Date: 05/20/2019 CLINICAL DATA:  Chest pain EXAM: CHEST - 2 VIEW COMPARISON:  05/09/2018 FINDINGS: Diffuse bilateral airspace disease, right greater than left again noted, unchanged. Low lung volumes. Heart is normal size. No visible effusions or pneumothorax. No acute bony abnormality. IMPRESSION: Severe diffuse bilateral airspace disease, right greater than left most compatible with pneumonia. No change. Electronically Signed   By: Charlett NoseKevin  Dover M.D.   On: 05/20/2019 12:51   CT Angio Chest PE W/Cm &/Or Wo Cm  Result Date: 05/20/2019 CLINICAL DATA:  COVID-19 pneumonia, shortness of breath EXAM: CT ANGIOGRAPHY CHEST WITH CONTRAST TECHNIQUE: Multidetector CT imaging of the chest was performed using the standard protocol during bolus administration of intravenous contrast. Multiplanar CT image  reconstructions and MIPs were obtained to evaluate the vascular anatomy. CONTRAST:  OMNIPAQUE IOHEXOL 350 MG/ML SOLN COMPARISON:  09/18/2016, 05/20/2019 FINDINGS: Cardiovascular: This is a technically adequate evaluation of the pulmonary vasculature. There are no filling defects or pulmonary emboli. Heart  is unremarkable without pericardial effusion. Thoracic aorta is grossly normal without aneurysm or dissection. Mediastinum/Nodes: Mediastinal and right hilar adenopathy likely reactive. The thyroid, trachea, and esophagus are normal. Lungs/Pleura: There is multifocal bilateral airspace disease, right greater than left, consistent with multifocal pneumonia. No effusion or pneumothorax. Central airways are patent. Upper Abdomen: No acute abnormality. Musculoskeletal: No acute or destructive bony lesions. Reconstructed images demonstrate no additional findings. Review of the MIP images confirms the above findings. IMPRESSION: 1. No evidence of pulmonary embolus. 2. Multifocal bilateral pneumonia consistent with COVID-19. Electronically Signed   By: Sharlet Salina M.D.   On: 05/20/2019 20:59    Procedures Procedures (including critical care time)  Medications Ordered in ED Medications  sodium chloride flush (NS) 0.9 % injection 3 mL (3 mLs Intravenous Not Given 05/20/19 1513)  magnesium sulfate IVPB 2 g 50 mL (0 g Intravenous Stopped 05/20/19 1817)  methylPREDNISolone sodium succinate (SOLU-MEDROL) 125 mg/2 mL injection 125 mg (125 mg Intravenous Given 05/20/19 1712)  albuterol (VENTOLIN HFA) 108 (90 Base) MCG/ACT inhaler 8 puff (8 puffs Inhalation Given 05/20/19 1708)  ipratropium (ATROVENT HFA) inhaler 2 puff (2 puffs Inhalation Given 05/20/19 1846)  fentaNYL (SUBLIMAZE) injection 25 mcg (25 mcg Intravenous Given 05/20/19 1709)  iohexol (OMNIPAQUE) 350 MG/ML injection 100 mL (100 mLs Intravenous Contrast Given 05/20/19 2052)    ED Course  I have reviewed the triage vital signs and the nursing notes.  Pertinent labs & imaging results that were available during my care of the patient were reviewed by me and considered in my medical decision making (see chart for details).    MDM Rules/Calculators/A&P                     Patient is a 39 year old female with pertinent past medical history of coronary  artery disease, hypertension presents to the emergency department for shortness of breath.  Patient is currently in respiratory distress, satting at 91% on 2 L.  Increase oxygen to 3 L patient is now sitting at 97%.  Tachypneic to 56.  Patient is using accessory muscles and cannot speak in full sentences.  Patient is not a candidate for BiPAP at this time because she still has Covid pneumonia.  Patient was just discharged from the hospital for respiratory distress due to Covid pneumonia.  .The causes for shortness of breath include but are not limited to Cardiac (AHF, pericardial effusion and tamponade, arrhythmias, ischemia, etc) Respiratory (COPD, asthma, pneumonia, pneumothorax, primary pulmonary hypertension, PE/VQ mismatch) Hematological (anemia) Neuromuscular (ALS, Guillain-Barr, etc) I suspect that patient is either having respiraotry distress due to Covid pneumonia or asthma exacerbation due to Covid pneumonia.  Initial interventions include albuterol, Atrovent, mag, Solu-Medrol.  Gave fentanyl for pain.  EKG interpreted by me demonstrated sinus tachycardia.  Checks x-ray interpreted by me demonstrated COVID pneumonia, no changes from last time.  Labs demonstrated stable CBC and CMP.  Leukocytosis of 13.6, unchanged from 9 days ago.  2 negative troponins.  Upon reassessment at 1 hour later patient is looking better, tachypneic to 30.  Patient is now able to speak in longer sentences, patient states that her pain is better controlled.  Tachycardia to 104.  We will consult hospitalist after PE study comes back  due to respiratory distress.  Patient has been waiting on CT for 4 hours at this time.  CT resulted with no signs of pulmonary embolism.  Discussed case with my attending physician who is agreeable to plan to consult for admission.  At shift change Dr. Rush Landmark is going to consult hospitalist.  He is also going to initiate IV antibiotics and albuterol due to continuous wheezing.  The  patient appears reasonably stabilized for admission considering the current resources, flow, and capabilities available in the ED at this time, and I doubt any other [facility] requiring further screening and/or treatment in the ED prior to admission.  .I discussed this case with my attending physician who cosigned this note including patient's presenting symptoms, physical exam, and planned diagnostics and interventions. Attending physician stated agreement with plan or made changes to plan which were implemented.   Attending physician assessed patient at bedside.   Final Clinical Impression(s) / ED Diagnoses Final diagnoses:  None    Rx / DC Orders ED Discharge Orders    None       Farrel Gordon, PA-C 05/21/19 1041    Farrel Gordon, PA-C 05/21/19 1041    Tegeler, Canary Brim, MD 05/22/19 1105

## 2019-05-21 ENCOUNTER — Inpatient Hospital Stay (HOSPITAL_COMMUNITY): Payer: Medicaid Other

## 2019-05-21 DIAGNOSIS — I5021 Acute systolic (congestive) heart failure: Secondary | ICD-10-CM

## 2019-05-21 DIAGNOSIS — J9601 Acute respiratory failure with hypoxia: Secondary | ICD-10-CM | POA: Diagnosis present

## 2019-05-21 LAB — CBC WITH DIFFERENTIAL/PLATELET
Abs Immature Granulocytes: 0.13 10*3/uL — ABNORMAL HIGH (ref 0.00–0.07)
Basophils Absolute: 0 10*3/uL (ref 0.0–0.1)
Basophils Relative: 0 %
Eosinophils Absolute: 0 10*3/uL (ref 0.0–0.5)
Eosinophils Relative: 0 %
HCT: 36.8 % (ref 36.0–46.0)
Hemoglobin: 11.8 g/dL — ABNORMAL LOW (ref 12.0–15.0)
Immature Granulocytes: 1 %
Lymphocytes Relative: 9 %
Lymphs Abs: 1.2 10*3/uL (ref 0.7–4.0)
MCH: 29 pg (ref 26.0–34.0)
MCHC: 32.1 g/dL (ref 30.0–36.0)
MCV: 90.4 fL (ref 80.0–100.0)
Monocytes Absolute: 0.4 10*3/uL (ref 0.1–1.0)
Monocytes Relative: 3 %
Neutro Abs: 11.5 10*3/uL — ABNORMAL HIGH (ref 1.7–7.7)
Neutrophils Relative %: 87 %
Platelets: 377 10*3/uL (ref 150–400)
RBC: 4.07 MIL/uL (ref 3.87–5.11)
RDW: 14.5 % (ref 11.5–15.5)
WBC: 13.2 10*3/uL — ABNORMAL HIGH (ref 4.0–10.5)
nRBC: 0 % (ref 0.0–0.2)

## 2019-05-21 LAB — POCT I-STAT 7, (LYTES, BLD GAS, ICA,H+H)
Acid-base deficit: 1 mmol/L (ref 0.0–2.0)
Bicarbonate: 22.5 mmol/L (ref 20.0–28.0)
Calcium, Ion: 1.2 mmol/L (ref 1.15–1.40)
HCT: 36 % (ref 36.0–46.0)
Hemoglobin: 12.2 g/dL (ref 12.0–15.0)
O2 Saturation: 91 %
Patient temperature: 98.7
Potassium: 4 mmol/L (ref 3.5–5.1)
Sodium: 137 mmol/L (ref 135–145)
TCO2: 23 mmol/L (ref 22–32)
pCO2 arterial: 32.1 mmHg (ref 32.0–48.0)
pH, Arterial: 7.454 — ABNORMAL HIGH (ref 7.350–7.450)
pO2, Arterial: 57 mmHg — ABNORMAL LOW (ref 83.0–108.0)

## 2019-05-21 LAB — STREP PNEUMONIAE URINARY ANTIGEN: Strep Pneumo Urinary Antigen: NEGATIVE

## 2019-05-21 LAB — EXPECTORATED SPUTUM ASSESSMENT W GRAM STAIN, RFLX TO RESP C

## 2019-05-21 LAB — COMPREHENSIVE METABOLIC PANEL
ALT: 138 U/L — ABNORMAL HIGH (ref 0–44)
AST: 62 U/L — ABNORMAL HIGH (ref 15–41)
Albumin: 2.2 g/dL — ABNORMAL LOW (ref 3.5–5.0)
Alkaline Phosphatase: 149 U/L — ABNORMAL HIGH (ref 38–126)
Anion gap: 12 (ref 5–15)
BUN: 13 mg/dL (ref 6–20)
CO2: 21 mmol/L — ABNORMAL LOW (ref 22–32)
Calcium: 8.7 mg/dL — ABNORMAL LOW (ref 8.9–10.3)
Chloride: 105 mmol/L (ref 98–111)
Creatinine, Ser: 1.52 mg/dL — ABNORMAL HIGH (ref 0.44–1.00)
GFR calc Af Amer: 49 mL/min — ABNORMAL LOW (ref >60)
GFR calc non Af Amer: 42 mL/min — ABNORMAL LOW (ref >60)
Glucose, Bld: 182 mg/dL — ABNORMAL HIGH (ref 70–99)
Potassium: 4.2 mmol/L (ref 3.5–5.1)
Sodium: 138 mmol/L (ref 135–145)
Total Bilirubin: 0.9 mg/dL (ref 0.3–1.2)
Total Protein: 6.4 g/dL — ABNORMAL LOW (ref 6.5–8.1)

## 2019-05-21 LAB — PROTIME-INR
INR: 1.2 (ref 0.8–1.2)
Prothrombin Time: 14.3 seconds (ref 11.4–15.2)

## 2019-05-21 LAB — C-REACTIVE PROTEIN: CRP: 19.8 mg/dL — ABNORMAL HIGH (ref <1.0)

## 2019-05-21 LAB — MRSA PCR SCREENING: MRSA by PCR: NEGATIVE

## 2019-05-21 LAB — LACTIC ACID, PLASMA: Lactic Acid, Venous: 1.9 mmol/L (ref 0.5–1.9)

## 2019-05-21 LAB — BRAIN NATRIURETIC PEPTIDE: B Natriuretic Peptide: 73.9 pg/mL (ref 0.0–100.0)

## 2019-05-21 LAB — HIV ANTIBODY (ROUTINE TESTING W REFLEX): HIV Screen 4th Generation wRfx: NONREACTIVE

## 2019-05-21 LAB — ECHOCARDIOGRAM COMPLETE
Height: 66 in
Weight: 3636.71 oz

## 2019-05-21 LAB — PROCALCITONIN: Procalcitonin: <0.1 ng/mL

## 2019-05-21 LAB — APTT: aPTT: 36 seconds (ref 24–36)

## 2019-05-21 LAB — MAGNESIUM: Magnesium: 2.3 mg/dL (ref 1.7–2.4)

## 2019-05-21 MED ORDER — FUROSEMIDE 10 MG/ML IJ SOLN
20.0000 mg | Freq: Once | INTRAMUSCULAR | Status: AC
  Administered 2019-05-21: 20 mg via INTRAVENOUS

## 2019-05-21 MED ORDER — FUROSEMIDE 10 MG/ML IJ SOLN
20.0000 mg | Freq: Once | INTRAMUSCULAR | Status: AC
  Administered 2019-05-21: 20 mg via INTRAVENOUS
  Filled 2019-05-21: qty 2

## 2019-05-21 MED ORDER — BENZONATATE 100 MG PO CAPS
200.0000 mg | ORAL_CAPSULE | Freq: Two times a day (BID) | ORAL | Status: DC
  Administered 2019-05-21 – 2019-05-30 (×19): 200 mg via ORAL
  Filled 2019-05-21 (×18): qty 2

## 2019-05-21 MED ORDER — GUAIFENESIN-DM 100-10 MG/5ML PO SYRP
5.0000 mL | ORAL_SOLUTION | Freq: Four times a day (QID) | ORAL | Status: DC | PRN
  Administered 2019-05-21 – 2019-05-28 (×9): 5 mL via ORAL
  Filled 2019-05-21 (×9): qty 5

## 2019-05-21 NOTE — ED Notes (Signed)
SDU  Breakfast Tray Ordered 

## 2019-05-21 NOTE — ED Notes (Signed)
This RN assumed care of pt. Pt rr of 35-40 O2 sat 83-91%. Pt work of breathing is very increased. MD paged. Some wheezing on L side, coughing intermittently. Given tessalon and albuterol.

## 2019-05-21 NOTE — ED Notes (Signed)
Pt has coughing fits and becomes tachypnec and hypoxic. MD aware. Pt on 10.5L [place].

## 2019-05-21 NOTE — ED Notes (Signed)
RN attempted to call report. Floor asked if I could call back later.

## 2019-05-21 NOTE — Progress Notes (Signed)
Patients ABG done and results showed patient is still hypoxic, Started her one [place] HFNC at 10L with Humidity. Patient is tolerating well and SATs have increased from 91% to 96-97% with good pleths, Patient ABG results correlate directly to her SAT readings on the monitor.

## 2019-05-21 NOTE — Progress Notes (Signed)
  Echocardiogram 2D Echocardiogram has been performed.  Burnard Hawthorne 05/21/2019, 10:48 AM

## 2019-05-21 NOTE — ED Notes (Signed)
ED TO INPATIENT HANDOFF REPORT  ED Nurse Name and Phone #: Wendie Simmer RN [phone]  S Name/Age/Gender Maria Bradford 39 y.o. female Room/Bed: 021C/021C  Code Status   Code Status: Full Code  Home/SNF/Other Home Patient oriented to: self, place, time and situation Is this baseline? Yes   Triage Complete: Triage complete  Chief Complaint Acute respiratory failure with hypoxemia (HCC) [J96.01]  Triage Note Pt arrives to ED today after following up with pcp and told to come to ED due to sob following recent covid pneumonia and no better.Pt has be prescribed 2 L [place] at all times but labored breathing with sitting. Pt aldo reports chest tightness.      Allergies Allergies  Allergen Reactions  . Oxycodone-Acetaminophen Nausea And Vomiting  . Oxycodone Nausea And Vomiting    Level of Care/Admitting Diagnosis ED Disposition    ED Disposition Condition Comment   Admit  Hospital Area: [facility] [[id number]]  Level of Care: Progressive [102]  Admit to Progressive based on following criteria: RESPIRATORY PROBLEMS hypoxemic/hypercapnic respiratory failure that is responsive to NIPPV (BiPAP) or High Flow Nasal Cannula (6-80 lpm). Frequent assessment/intervention, no > Q2 hrs < Q4 hrs, to maintain oxygenation and pulmonary hygiene.  Covid Evaluation: Recent COVID positive no isolation required infection day 21-90  Diagnosis: Acute respiratory failure with hypoxemia [facility]) [[id number]]  Admitting Physician: Marinda Elk [[id number]]  Attending Physician: Marinda Elk [[id number]]  Estimated length of stay: 3 - 4 days  Certification:: I certify this patient will need inpatient services for at least 2 midnights       B Medical/Surgery History Past Medical History:  Diagnosis Date  . Coronary artery disease   . Hypertension    Past Surgical History:  Procedure Laterality Date  . CESAREAN SECTION    . CORONARY ANGIOPLASTY WITH STENT PLACEMENT    . TUBAL LIGATION        A IV Location/Drains/Wounds Patient Lines/Drains/Airways Status   Active Line/Drains/Airways    Name:   Placement date:   Placement time:   Site:   Days:   Peripheral IV 05/20/19 Right;Anterior Forearm   05/20/19    2017    Forearm   1          Intake/Output Last 24 hours  Intake/Output Summary (Last 24 hours) at 05/21/2019 0505 Last data filed at 05/21/2019 0414 Gross per 24 hour  Intake 149.86 ml  Output 800 ml  Net -650.14 ml    Labs/Imaging Results for orders placed or performed during the hospital encounter of 05/20/19 (from the past 48 hour(s))  Basic metabolic panel     Status: Abnormal   Collection Time: 05/20/19 11:55 AM  Result Value Ref Range   Sodium 141 135 - 145 mmol/L   Potassium 4.5 3.5 - 5.1 mmol/L   Chloride 105 98 - 111 mmol/L   CO2 24 22 - 32 mmol/L   Glucose, Bld 144 (H) 70 - 99 mg/dL    Comment: Glucose reference range applies only to samples taken after fasting for at least 8 hours.   BUN 6 6 - 20 mg/dL   Creatinine, Ser 8.65 (H) 0.44 - 1.00 mg/dL   Calcium 8.7 (L) 8.9 - 10.3 mg/dL   GFR calc non Af Amer 48 (L) >60 mL/min   GFR calc Af Amer 56 (L) >60 mL/min   Anion gap 12 5 - 15    Comment: Performed at [facility], [address], [place], [place] [address]  CBC     Status: Abnormal   Collection Time: 05/20/19 11:55 AM  Result Value Ref Range   WBC 13.6 (H) 4.0 - 10.5 K/uL   RBC 4.26 3.87 - 5.11 MIL/uL   Hemoglobin 12.3 12.0 - 15.0 g/dL   HCT 16.1 09.6 - 04.5 %   MCV 92.7 80.0 - 100.0 fL   MCH 28.9 26.0 - 34.0 pg   MCHC 31.1 30.0 - 36.0 g/dL   RDW 40.9 81.1 - 91.4 %   Platelets 374 150 - 400 K/uL   nRBC 0.0 0.0 - 0.2 %    Comment: Performed at [facility], [address] [place], [place] [address]  Troponin I (High Sensitivity)     Status: None   Collection Time: 05/20/19 11:55 AM  Result Value Ref Range   Troponin I (High Sensitivity) 9 <18 ng/L    Comment: (NOTE) Elevated high sensitivity troponin I (hsTnI)  values and significant  changes across serial measurements may suggest ACS but many other  chronic and acute conditions are known to elevate hsTnI results.  Refer to the "Links" section for chest pain algorithms and additional  guidance. Performed at [facility], [address], [place], [place] [address]   I-Stat beta hCG blood, ED     Status: None   Collection Time: 05/20/19 12:36 PM  Result Value Ref Range   I-stat hCG, quantitative <5.0 <5 mIU/mL   Comment 3            Comment:   GEST. AGE      CONC.  (mIU/mL)   <=1 WEEK        5 - 50     2 WEEKS       50 - 500     3 WEEKS       100 - 10,000     4 WEEKS     1,000 - 30,000        FEMALE AND NON-PREGNANT FEMALE:     LESS THAN 5 mIU/mL   Troponin I (High Sensitivity)     Status: None   Collection Time: 05/20/19  5:00 PM  Result Value Ref Range   Troponin I (High Sensitivity) 8 <18 ng/L    Comment: (NOTE) Elevated high sensitivity troponin I (hsTnI) values and significant  changes across serial measurements may suggest ACS but many other  chronic and acute conditions are known to elevate hsTnI results.  Refer to the "Links" section for chest pain algorithms and additional  guidance. Performed at [facility], [address] [place], [place] [address]   I-Stat beta hCG blood, ED     Status: None   Collection Time: 05/20/19  5:08 PM  Result Value Ref Range   I-stat hCG, quantitative <5.0 <5 mIU/mL   Comment 3            Comment:   GEST. AGE      CONC.  (mIU/mL)   <=1 WEEK        5 - 50     2 WEEKS       50 - 500     3 WEEKS       100 - 10,000     4 WEEKS     1,000 - 30,000        FEMALE AND NON-PREGNANT FEMALE:     LESS THAN 5 mIU/mL   Lactic acid, plasma     Status: None   Collection Time:  05/20/19 10:03 PM  Result Value Ref Range   Lactic Acid, Venous 1.5 0.5 - 1.9 mmol/L    Comment: Performed at [facility], [address], [place], [place] [address]  I-STAT 7, (LYTES, BLD GAS, ICA, H+H)      Status: Abnormal   Collection Time: 05/20/19 10:04 PM  Result Value Ref Range   pH, Arterial 7.475 (H) 7.350 - 7.450   pCO2 arterial 33.9 32.0 - 48.0 mmHg   pO2, Arterial 76 (L) 83.0 - 108.0 mmHg   Bicarbonate 24.9 20.0 - 28.0 mmol/L   TCO2 26 22 - 32 mmol/L   O2 Saturation 96.0 %   Acid-Base Excess 2.0 0.0 - 2.0 mmol/L   Sodium 136 135 - 145 mmol/L   Potassium 4.1 3.5 - 5.1 mmol/L   Calcium, Ion 1.25 1.15 - 1.40 mmol/L   HCT 38.0 36.0 - 46.0 %   Hemoglobin 12.9 12.0 - 15.0 g/dL   Patient temperature 47.8 F    Collection site Radial    Drawn by Operator    Sample type ARTERIAL   Brain natriuretic peptide     Status: None   Collection Time: 05/20/19 10:59 PM  Result Value Ref Range   B Natriuretic Peptide 73.9 0.0 - 100.0 pg/mL    Comment: Performed at [facility], [address] [place], [place] [address]  Lactic acid, plasma     Status: None   Collection Time: 05/20/19 11:38 PM  Result Value Ref Range   Lactic Acid, Venous 1.9 0.5 - 1.9 mmol/L    Comment: Performed at [facility], [address] [place], [place] [address]  C-reactive protein     Status: Abnormal   Collection Time: 05/20/19 11:40 PM  Result Value Ref Range   CRP 19.8 (H) <1.0 mg/dL    Comment: Performed at [facility], [address] [place], [place] [address]  Strep pneumoniae urinary antigen     Status: None   Collection Time: 05/20/19 11:40 PM  Result Value Ref Range   Strep Pneumo Urinary Antigen NEGATIVE NEGATIVE    Comment:        Infection due to S. pneumoniae cannot be absolutely ruled out since the antigen present may be below the detection limit of the test. Performed at [facility], [address] [place], [place] [address]   Protime-INR     Status: None   Collection Time: 05/20/19 11:40 PM  Result Value Ref Range   Prothrombin Time 14.3 11.4 - 15.2 seconds   INR 1.2 0.8 - 1.2    Comment: (NOTE) INR goal varies based on device and disease  states. Performed at [facility], [address] [place], [place] [address]   APTT     Status: None   Collection Time: 05/20/19 11:40 PM  Result Value Ref Range   aPTT 36 24 - 36 seconds    Comment: Performed at [facility], [address] [place], [place] [address]  Procalcitonin     Status: None   Collection Time: 05/20/19 11:40 PM  Result Value Ref Range   Procalcitonin <0.10 ng/mL    Comment:        Interpretation: PCT (Procalcitonin) <= 0.5 ng/mL: Systemic infection (sepsis) is not likely. Local bacterial infection is possible. (NOTE)       Sepsis PCT Algorithm           Lower Respiratory Tract  Infection PCT Algorithm    ----------------------------     ----------------------------         PCT < 0.25 ng/mL                PCT < 0.10 ng/mL         Strongly encourage             Strongly discourage   discontinuation of antibiotics    initiation of antibiotics    ----------------------------     -----------------------------       PCT 0.25 - 0.50 ng/mL            PCT 0.10 - 0.25 ng/mL               OR       >80% decrease in PCT            Discourage initiation of                                            antibiotics      Encourage discontinuation           of antibiotics    ----------------------------     -----------------------------         PCT >= 0.50 ng/mL              PCT 0.26 - 0.50 ng/mL               AND        <80% decrease in PCT             Encourage initiation of                                             antibiotics       Encourage continuation           of antibiotics    ----------------------------     -----------------------------        PCT >= 0.50 ng/mL                  PCT > 0.50 ng/mL               AND         increase in PCT                  Strongly encourage                                      initiation of antibiotics    Strongly encourage escalation           of antibiotics                                      -----------------------------                                           PCT <= 0.25 ng/mL  OR                                        > 80% decrease in PCT                                     Discontinue / Do not initiate                                             antibiotics Performed at [facility], [address], [place], [place] [address]   I-STAT 7, (LYTES, BLD GAS, ICA, H+H)     Status: Abnormal   Collection Time: 05/21/19  2:31 AM  Result Value Ref Range   pH, Arterial 7.454 (H) 7.350 - 7.450   pCO2 arterial 32.1 32.0 - 48.0 mmHg   pO2, Arterial 57 (L) 83.0 - 108.0 mmHg   Bicarbonate 22.5 20.0 - 28.0 mmol/L   TCO2 23 22 - 32 mmol/L   O2 Saturation 91.0 %   Acid-base deficit 1.0 0.0 - 2.0 mmol/L   Sodium 137 135 - 145 mmol/L   Potassium 4.0 3.5 - 5.1 mmol/L   Calcium, Ion 1.20 1.15 - 1.40 mmol/L   HCT 36.0 36.0 - 46.0 %   Hemoglobin 12.2 12.0 - 15.0 g/dL   Patient temperature 41.7 F    Collection site Radial    Drawn by Operator    Sample type ARTERIAL    DG Chest 1 View  Result Date: 05/21/2019 CLINICAL DATA:  COVID-19 pneumonia, shortness of breath EXAM: CHEST  1 VIEW COMPARISON:  05/20/2019 FINDINGS: Single frontal view of the chest demonstrates stable multifocal bilateral pneumonia. No effusion or pneumothorax. Cardiac silhouette is stable. IMPRESSION: 1. Stable bilateral multifocal pneumonia. Electronically Signed   By: Sharlet Salina M.D.   On: 05/21/2019 02:09   DG Chest 2 View  Result Date: 05/20/2019 CLINICAL DATA:  Chest pain EXAM: CHEST - 2 VIEW COMPARISON:  05/09/2018 FINDINGS: Diffuse bilateral airspace disease, right greater than left again noted, unchanged. Low lung volumes. Heart is normal size. No visible effusions or pneumothorax. No acute bony abnormality. IMPRESSION: Severe diffuse bilateral airspace disease, right greater than left most compatible with pneumonia. No change.  Electronically Signed   By: Charlett Nose M.D.   On: 05/20/2019 12:51   CT Angio Chest PE W/Cm &/Or Wo Cm  Result Date: 05/20/2019 CLINICAL DATA:  COVID-19 pneumonia, shortness of breath EXAM: CT ANGIOGRAPHY CHEST WITH CONTRAST TECHNIQUE: Multidetector CT imaging of the chest was performed using the standard protocol during bolus administration of intravenous contrast. Multiplanar CT image reconstructions and MIPs were obtained to evaluate the vascular anatomy. CONTRAST:  OMNIPAQUE IOHEXOL 350 MG/ML SOLN COMPARISON:  09/18/2016, 05/20/2019 FINDINGS: Cardiovascular: This is a technically adequate evaluation of the pulmonary vasculature. There are no filling defects or pulmonary emboli. Heart is unremarkable without pericardial effusion. Thoracic aorta is grossly normal without aneurysm or dissection. Mediastinum/Nodes: Mediastinal and right hilar adenopathy likely reactive. The thyroid, trachea, and esophagus are normal. Lungs/Pleura: There is multifocal bilateral airspace disease, right greater than left, consistent with multifocal pneumonia. No effusion or pneumothorax. Central airways are patent. Upper Abdomen: No acute abnormality. Musculoskeletal: No acute or destructive  bony lesions. Reconstructed images demonstrate no additional findings. Review of the MIP images confirms the above findings. IMPRESSION: 1. No evidence of pulmonary embolus. 2. Multifocal bilateral pneumonia consistent with COVID-19. Electronically Signed   By: Sharlet Salina M.D.   On: 05/20/2019 20:59    Pending Labs Unresulted Labs (From admission, onward)    Start     Ordered   05/21/19 0500  Magnesium  Tomorrow morning,   R     05/20/19 2339   05/21/19 0500  CBC WITH DIFFERENTIAL  Tomorrow morning,   R     05/20/19 2339   05/21/19 0500  Comprehensive metabolic panel  Tomorrow morning,   R     05/20/19 2339   05/21/19 0140  Blood gas, arterial  ONCE - STAT,   R     05/21/19 0140   05/21/19 0100  HIV Antibody (routine  testing w rflx)  Once,   R     05/21/19 0100   05/20/19 2340  Legionella Pneumophila Serogp 1 Ur Ag  Once,   STAT     05/20/19 2339   05/20/19 2256  Expectorated sputum assessment w rflx to resp cult  Once,   R     05/20/19 2255   05/20/19 2139  Blood gas, arterial  Once,   R     05/20/19 2138   05/20/19 2138  Blood culture (routine x 2)  BLOOD CULTURE X 2,   STAT     05/20/19 2138          Vitals/Pain Today's Vitals   05/21/19 0300 05/21/19 0400 05/21/19 0411 05/21/19 0413  BP: 110/75 (!) 131/103    Pulse: 86 97 96 95  Resp: (!) 25 (!) 29 (!) 37 (!) 28  Temp:      TempSrc:      SpO2: 96% 93% 91% 92%  Weight:      Height:      PainSc:        Isolation Precautions No active isolations  Medications Medications  sodium chloride flush (NS) 0.9 % injection 3 mL (3 mLs Intravenous Not Given 05/20/19 1513)  aspirin EC tablet 81 mg (has no administration in time range)  carvedilol (COREG) tablet 6.25 mg (6.25 mg Oral Given 05/20/19 2357)  sertraline (ZOLOFT) tablet 50 mg (has no administration in time range)  benzonatate (TESSALON) capsule 200 mg (200 mg Oral Given 05/21/19 0126)  polyethylene glycol (MIRALAX / GLYCOLAX) packet 17 g (has no administration in time range)  ondansetron (ZOFRAN) tablet 4 mg (has no administration in time range)    Or  ondansetron (ZOFRAN) injection 4 mg (has no administration in time range)  cefTRIAXone (ROCEPHIN) 2 g in sodium chloride 0.9 % 100 mL IVPB (has no administration in time range)  azithromycin (ZITHROMAX) 500 mg in sodium chloride 0.9 % 250 mL IVPB (has no administration in time range)  enoxaparin (LOVENOX) injection 40 mg (has no administration in time range)  acetaminophen (TYLENOL) tablet 650 mg (has no administration in time range)    Or  acetaminophen (TYLENOL) suppository 650 mg (has no administration in time range)  albuterol (PROVENTIL) (2.5 MG/3ML) 0.083% nebulizer solution 2.5 mg ( Nebulization See Alternative 05/21/19 0126)     Or  albuterol (VENTOLIN HFA) 108 (90 Base) MCG/ACT inhaler 2 puff (2 puffs Inhalation Given 05/21/19 0126)  atorvastatin (LIPITOR) tablet 80 mg (has no administration in time range)  nitroGLYCERIN (NITROSTAT) SL tablet 0.4 mg (has no administration in time range)  magnesium sulfate IVPB  2 g 50 mL (0 g Intravenous Stopped 05/20/19 1817)  methylPREDNISolone sodium succinate (SOLU-MEDROL) 125 mg/2 mL injection 125 mg (125 mg Intravenous Given 05/20/19 1712)  albuterol (VENTOLIN HFA) 108 (90 Base) MCG/ACT inhaler 8 puff (8 puffs Inhalation Given 05/20/19 1708)  ipratropium (ATROVENT HFA) inhaler 2 puff (2 puffs Inhalation Given 05/20/19 1846)  fentaNYL (SUBLIMAZE) injection 25 mcg (25 mcg Intravenous Given 05/20/19 1709)  iohexol (OMNIPAQUE) 350 MG/ML injection 100 mL (100 mLs Intravenous Contrast Given 05/20/19 2052)  cefTRIAXone (ROCEPHIN) 2 g in sodium chloride 0.9 % 100 mL IVPB (0 g Intravenous Stopped 05/20/19 2241)  azithromycin (ZITHROMAX) 500 mg in sodium chloride 0.9 % 250 mL IVPB (0 mg Intravenous Stopped 05/20/19 2336)  albuterol (VENTOLIN HFA) 108 (90 Base) MCG/ACT inhaler 2 puff (2 puffs Inhalation Given 05/20/19 2125)  ipratropium (ATROVENT HFA) inhaler 2 puff (2 puffs Inhalation Given 05/20/19 2125)  sodium chloride 0.9 % bolus 1,000 mL (0 mLs Intravenous Stopped 05/21/19 0002)  furosemide (LASIX) injection 20 mg (20 mg Intravenous Given 05/21/19 0318)  furosemide (LASIX) injection 20 mg (20 mg Intravenous Given 05/21/19 0414)    Mobility walks Low fall risk   Focused Assessments Pulmonary Assessment Handoff:  Lung sounds: Bilateral Breath Sounds: Diminished O2 Device: High Flow Nasal Cannula O2 Flow Rate (L/min): 10 L/min      R Recommendations: See Admitting Provider Note  Report given to:   Additional Notes:

## 2019-05-21 NOTE — Progress Notes (Addendum)
PROGRESS NOTE    Maria Bradford  FXT:[id number] DOB: 10-29-1980 DOA: 05/20/2019 PCP: Burnis Medin, PA-C  Brief Narrative:39 year old female with past medical history of coronary artery disease (inferior wall STEMI 05/2016, S/P angioplasty and stenting of RCA), VFib (post-STEMI complication, S/P Cardioversion x 3), hypertension, hyperlipidemia, obesity and recent diagnosis with COVID-19 on 4/19 requiring hospitalization at [facility] and discharged on 5/3 on 2 L of oxygen presented to the emergency room on 5/11 with progressive shortness of breath, which is slowly progressive over the week, reports ongoing cough intermittently productive of whitish to clear sputum, also complains of chest pain on the sides worsening with cough and inspiration, denies any fevers or chills, denies orthopnea PND. Work-up in the ED noted increasing oxygen requirement, CTA chest was negative for PE showed bilateral multifocal pneumonia consistent with Covid -She was started on broad-spectrum antibiotics to cover for secondary bacterial pneumonia   Assessment & Plan:      Acute respiratory failure with hypoxia (HCC) -Recent Covid pneumonia -Worsening oxygen requirement, work-up is mostly significant for residual Covid pneumonia, she was started on broad-spectrum antibiotics yesterday on admission for secondary bacterial infection which will be continued, however her procalcitonin level was low -CTA chest negative for PE, notes multifocal pneumonia consistent with Covid, she completed her Covid isolation on 5/10, completed course of Decadron and remdesivir -Clinically does not appear fluid overloaded however will diurese with gentle Lasix to keep net negative -Add incentive spirometry, Tessalon Perles, -Wean O2 as tolerated -Also check 2D echocardiogram to assess PA pressures, diastolic dysfunction etc.  History of CAD -Stable, no evidence of ACS, -Continue aspirin, Coreg, statin  Hypertension -Stable,  continue Coreg  Obesity -BMI 36.6 -Lifestyle modification recommended   DVT prophylaxis: Lovenox Code Status: Full code Family Communication: Discussed with patient in detail, no family at bedside Disposition Plan: Home pending improvement in hypoxia Status is: Inpatient  Remains inpatient appropriate because: High oxygen requirement Dispo: The patient is from: Home              Anticipated d/c is to: Home              Anticipated d/c date is: 3 days              Patient currently is not medically stable to d/c.     Procedures:   Antimicrobials:    Subjective: -Complains of weakness and poor appetite  Objective: Vitals:   05/21/19 0530 05/21/19 0643 05/21/19 0817 05/21/19 0938  BP:  (!) 141/99  (!) 134/96  Pulse: 82 95  100  Resp: (!) 36   (!) 21  Temp:  98.4 F (36.9 C)  98.6 F (37 C)  TempSrc:  Oral  Oral  SpO2: 97% 95% 94% 90%  Weight:      Height:        Intake/Output Summary (Last 24 hours) at 05/21/2019 1105 Last data filed at 05/21/2019 0513 Gross per 24 hour  Intake 149.86 ml  Output 1600 ml  Net -1450.14 ml   Filed Weights   05/20/19 1625  Weight: 103.1 kg    Examination:  General exam: Obese pleasant female sitting up in bed, AAOx3, no distress Respiratory system: Poor air movement bilaterally, no wheezes, bronchial breath sounds bilaterally  cardiovascular system: S1 & S2 heard, RRR.  Gastrointestinal system: Abdomen is nondistended, soft and nontender.Normal bowel sounds heard. Central nervous system: Alert and oriented. No focal neurological deficits. Extremities: No edema Skin: No rashes, lesions or ulcers Psychiatry: Judgement and  insight appear normal. Mood & affect appropriate.     Data Reviewed:   CBC: Recent Labs  Lab 05/20/19 1155 05/20/19 2204 05/21/19 0231 05/21/19 0409  WBC 13.6*  --   --  13.2*  NEUTROABS  --   --   --  11.5*  HGB 12.3 12.9 12.2 11.8*  HCT 39.5 38.0 36.0 36.8  MCV 92.7  --   --  90.4  PLT 374   --   --  377   Basic Metabolic Panel: Recent Labs  Lab 05/20/19 1155 05/20/19 2204 05/21/19 0231 05/21/19 0409  NA 141 136 137 138  K 4.5 4.1 4.0 4.2  CL 105  --   --  105  CO2 24  --   --  21*  GLUCOSE 144*  --   --  182*  BUN 6  --   --  13  CREATININE 1.36*  --   --  1.52*  CALCIUM 8.7*  --   --  8.7*  MG  --   --   --  2.3   GFR: Estimated Creatinine Clearance: 59.1 mL/min (A) (by C-G formula based on SCr of 1.52 mg/dL (H)). Liver Function Tests: Recent Labs  Lab 05/21/19 0409  AST 62*  ALT 138*  ALKPHOS 149*  BILITOT 0.9  PROT 6.4*  ALBUMIN 2.2*   No results for input(s): LIPASE, AMYLASE in the last 168 hours. No results for input(s): AMMONIA in the last 168 hours. Coagulation Profile: Recent Labs  Lab 05/20/19 2340  INR 1.2   Cardiac Enzymes: No results for input(s): CKTOTAL, CKMB, CKMBINDEX, TROPONINI in the last 168 hours. BNP (last 3 results) No results for input(s): PROBNP in the last 8760 hours. HbA1C: No results for input(s): HGBA1C in the last 72 hours. CBG: No results for input(s): GLUCAP in the last 168 hours. Lipid Profile: No results for input(s): CHOL, HDL, LDLCALC, TRIG, CHOLHDL, LDLDIRECT in the last 72 hours. Thyroid Function Tests: No results for input(s): TSH, T4TOTAL, FREET4, T3FREE, THYROIDAB in the last 72 hours. Anemia Panel: No results for input(s): VITAMINB12, FOLATE, FERRITIN, TIBC, IRON, RETICCTPCT in the last 72 hours. Urine analysis:    Component Value Date/Time   COLORURINE YELLOW 01/16/2014 0912   APPEARANCEUR CLOUDY (A) 01/16/2014 0912   LABSPEC 1.024 01/16/2014 0912   PHURINE 6.0 01/16/2014 0912   GLUCOSEU NEGATIVE 01/16/2014 0912   HGBUR NEGATIVE 01/16/2014 0912   BILIRUBINUR NEGATIVE 01/16/2014 0912   KETONESUR NEGATIVE 01/16/2014 0912   PROTEINUR 30 (A) 01/16/2014 0912   UROBILINOGEN 1.0 01/16/2014 0912   NITRITE NEGATIVE 01/16/2014 0912   LEUKOCYTESUR LARGE (A) 01/16/2014 0912   Sepsis Labs:  @LABRCNTIP (procalcitonin:4,lacticidven:4)  ) Recent Results (from the past 240 hour(s))  Blood culture (routine x 2)     Status: None (Preliminary result)   Collection Time: 05/20/19 10:08 PM   Specimen: BLOOD RIGHT FOREARM  Result Value Ref Range Status   Specimen Description BLOOD RIGHT FOREARM  Final   Special Requests   Final    BOTTLES DRAWN AEROBIC AND ANAEROBIC Blood Culture adequate volume   Culture   Final    NO GROWTH < 12 HOURS Performed at [facility], [address], [place], [place]    Report Status PENDING  Incomplete         Radiology Studies: DG Chest 1 View  Result Date: 05/21/2019 CLINICAL DATA:  COVID-19 pneumonia, shortness of breath EXAM: CHEST  1 VIEW COMPARISON:  05/20/2019 FINDINGS: Single frontal view  of the chest demonstrates stable multifocal bilateral pneumonia. No effusion or pneumothorax. Cardiac silhouette is stable. IMPRESSION: 1. Stable bilateral multifocal pneumonia. Electronically Signed   By: Randa Ngo M.D.   On: 05/21/2019 02:09   DG Chest 2 View  Result Date: 05/20/2019 CLINICAL DATA:  Chest pain EXAM: CHEST - 2 VIEW COMPARISON:  05/09/2018 FINDINGS: Diffuse bilateral airspace disease, right greater than left again noted, unchanged. Low lung volumes. Heart is normal size. No visible effusions or pneumothorax. No acute bony abnormality. IMPRESSION: Severe diffuse bilateral airspace disease, right greater than left most compatible with pneumonia. No change. Electronically Signed   By: Rolm Baptise M.D.   On: 05/20/2019 12:51   CT Angio Chest PE W/Cm &/Or Wo Cm  Result Date: 05/20/2019 CLINICAL DATA:  COVID-19 pneumonia, shortness of breath EXAM: CT ANGIOGRAPHY CHEST WITH CONTRAST TECHNIQUE: Multidetector CT imaging of the chest was performed using the standard protocol during bolus administration of intravenous contrast. Multiplanar CT image reconstructions and MIPs were obtained to evaluate the vascular anatomy. CONTRAST:   124mL OMNIPAQUE IOHEXOL 350 MG/ML SOLN COMPARISON:  09/18/2016, 05/20/2019 FINDINGS: Cardiovascular: This is a technically adequate evaluation of the pulmonary vasculature. There are no filling defects or pulmonary emboli. Heart is unremarkable without pericardial effusion. Thoracic aorta is grossly normal without aneurysm or dissection. Mediastinum/Nodes: Mediastinal and right hilar adenopathy likely reactive. The thyroid, trachea, and esophagus are normal. Lungs/Pleura: There is multifocal bilateral airspace disease, right greater than left, consistent with multifocal pneumonia. No effusion or pneumothorax. Central airways are patent. Upper Abdomen: No acute abnormality. Musculoskeletal: No acute or destructive bony lesions. Reconstructed images demonstrate no additional findings. Review of the MIP images confirms the above findings. IMPRESSION: 1. No evidence of pulmonary embolus. 2. Multifocal bilateral pneumonia consistent with COVID-19. Electronically Signed   By: Randa Ngo M.D.   On: 05/20/2019 20:59        Scheduled Meds: . aspirin EC  81 mg Oral Daily  . atorvastatin  80 mg Oral Daily  . benzonatate  200 mg Oral BID  . carvedilol  6.25 mg Oral BID  . enoxaparin (LOVENOX) injection  40 mg Subcutaneous Daily  . furosemide  20 mg Intravenous Once  . sertraline  50 mg Oral Daily  . sodium chloride flush  3 mL Intravenous Once   Continuous Infusions: . azithromycin    . cefTRIAXone (ROCEPHIN)  IV       LOS: 1 day    Time spent: 37min  Domenic Polite, MD [facility]  05/21/2019, 11:05 AM

## 2019-05-22 DIAGNOSIS — J189 Pneumonia, unspecified organism: Secondary | ICD-10-CM

## 2019-05-22 DIAGNOSIS — J9601 Acute respiratory failure with hypoxia: Secondary | ICD-10-CM

## 2019-05-22 LAB — BASIC METABOLIC PANEL
Anion gap: 10 (ref 5–15)
BUN: 14 mg/dL (ref 6–20)
CO2: 24 mmol/L (ref 22–32)
Calcium: 8.5 mg/dL — ABNORMAL LOW (ref 8.9–10.3)
Chloride: 105 mmol/L (ref 98–111)
Creatinine, Ser: 1.52 mg/dL — ABNORMAL HIGH (ref 0.44–1.00)
GFR calc Af Amer: 49 mL/min — ABNORMAL LOW (ref >60)
GFR calc non Af Amer: 42 mL/min — ABNORMAL LOW (ref >60)
Glucose, Bld: 122 mg/dL — ABNORMAL HIGH (ref 70–99)
Potassium: 3.9 mmol/L (ref 3.5–5.1)
Sodium: 139 mmol/L (ref 135–145)

## 2019-05-22 LAB — LEGIONELLA PNEUMOPHILA SEROGP 1 UR AG: L. pneumophila Serogp 1 Ur Ag: NEGATIVE

## 2019-05-22 LAB — CBC
HCT: 34 % — ABNORMAL LOW (ref 36.0–46.0)
Hemoglobin: 10.9 g/dL — ABNORMAL LOW (ref 12.0–15.0)
MCH: 28.7 pg (ref 26.0–34.0)
MCHC: 32.1 g/dL (ref 30.0–36.0)
MCV: 89.5 fL (ref 80.0–100.0)
Platelets: 346 10*3/uL (ref 150–400)
RBC: 3.8 MIL/uL — ABNORMAL LOW (ref 3.87–5.11)
RDW: 14.6 % (ref 11.5–15.5)
WBC: 18.1 10*3/uL — ABNORMAL HIGH (ref 4.0–10.5)
nRBC: 0 % (ref 0.0–0.2)

## 2019-05-22 LAB — FERRITIN: Ferritin: 233 ng/mL (ref 11–307)

## 2019-05-22 LAB — C-REACTIVE PROTEIN: CRP: 10.3 mg/dL — ABNORMAL HIGH (ref <1.0)

## 2019-05-22 MED ORDER — HYDROCOD POLST-CPM POLST ER 10-8 MG/5ML PO SUER
5.0000 mL | Freq: Two times a day (BID) | ORAL | Status: DC | PRN
  Administered 2019-05-22 – 2019-05-28 (×10): 5 mL via ORAL
  Filled 2019-05-22 (×10): qty 5

## 2019-05-22 NOTE — Consult Note (Signed)
NAME:  Maria Bradford, MRN:  [id number], DOB:  Aug 02, 1980, LOS: 2 ADMISSION DATE:  05/20/2019, CONSULTATION DATE:  05/22/19 REFERRING MD:  Dr. Jomarie Longs, CHIEF COMPLAINT:  SOB   Brief History   39 y/o F admitted 5/11 with reports of progressive shortness of breath.  Hx of post-COVID.  PE ruled out but imaging shows dense R>L infiltrate worrisome for post viral bacterial PNA. Increased O2 needs.   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   She was diagnosed with COVID on 4/19 and hospitalized from 4/26 - 5/3.  During that hospitalization she was treated with IV remdesivir (5 days), decadron and oxygen.  She was discharged on oral decadron for 10 days total and 2L O2.   She reports she initially felt better but over the week prior to admit she developed progressive shortness of breath, increased cough with white sputum production and pleuritic chest pain. ER evaluation included a CTA of the chest which was negative for PE but showed R>L multifocal infiltrates with mediastinal and right hilar adenopathy.  The patient was admitted per [facility] and treated for pneumonia with rocephin and azithromycin. She had increased O2 needs and PCCM consulted for evaluation.   Past Medical History  COVID - positive 04/28/19, required admission, treated with decadron, remdesivir, O2 HTN CAD  Significant Hospital Events   5/11 Admit  5/13 PCCM consulted   Consults:  PCCM   Procedures:    Significant Diagnostic Tests:   CTA Chest 5/13 >> negative for PE but showed R>L multifocal infiltrates with mediastinal and right hilar adenopathy.    ECHO 5/12 >> LVEF 65-80%, normal LV function, no WMA, LV diastolic parameters consistent with grade I diastolic dysfunction, RV systolic function normal  Micro Data:  Sputum 5/12 >>  BCx2 5/11 >>  U. Strep Antigen 5/11 >> negative   Antimicrobials:  Rocephin 5/11 >>   Azithromycin 5/11 >>    Interim history/subjective:  Pt reports O2 recently increased to 7L and she feels better  Afebrile  Decreased appetite but tolerating PO's   Objective   Blood pressure (!) 145/98, pulse 89, temperature 98.7 F (37.1 C), temperature source Oral, resp. rate (!) 22, height 5\' 6"  (1.676 m), weight 103.1 kg, SpO2 96 %.        Intake/Output Summary (Last 24 hours) at 05/22/2019 1130 Last data filed at 05/22/2019 0448 Gross per 24 hour  Intake 590 ml  Output 800 ml  Net -210 ml   Filed Weights   05/20/19 1625  Weight: 103.1 kg    Examination: General: adult female sitting up in bed in NAD  HEENT: MM pink/moist, good dentition, anicteric  Neuro: AAOx4, speech clear, MAE  CV: s1s2 RRR, no m/r/g PULM: intermittent tachypnea but non-labored, lungs bilaterally with bronchial breath sounds R>L GI: soft, bsx4 active  Extremities: warm/dry, no edema  Skin: no rashes or lesions  Resolved Hospital Problem list     Assessment & Plan:   Acute Hypoxic Respiratory Failure in setting of Bilateral Infiltrates Post COVID infection (dx 4/19) with treatment with decadron, remdesivir and oxygen. She did not receive Actemra.  Discharged on 2L O2.  PE ruled out on CTA.  Imaging reviewed and shows dense R>L infiltrates with mediastinal and right hilar adenopathy.  Concern for possible post viral bacterial PNA. Strep antigen negative.  -wean O2 for sats >88% -pulmonary hygiene - IS,  mobilize  -cough suppression with scheduled tessalon + PRN Robitussin or tussionex  -follow intermittent CXR -continue abx for 7 days total  -follow up legionella antigen -albuterol PRN  -continue close saturation monitoring  -follow sputum culture   Best practice:  Diet: as tolerated  DVT prophylaxis: lovenox  GI prophylaxis: n/a  Glucose control: per primary  Mobility: as tolerated  Code Status: Full Code  Family Communication: Patient updated on plan of care 5/13 at bedside  Disposition: Floor, per [facility]   Labs   CBC: Recent Labs  Lab 05/20/19 1155 05/20/19 2204 05/21/19 0231 05/21/19 0409 05/22/19 0224  WBC 13.6*  --   --  13.2* 18.1*  NEUTROABS  --   --   --  11.5*  --   HGB 12.3 12.9 12.2 11.8* 10.9*  HCT 39.5 38.0 36.0 36.8 34.0*  MCV 92.7  --   --  90.4 89.5  PLT 374  --   --  377 694    Basic Metabolic Panel: Recent Labs  Lab 05/20/19 1155 05/20/19 2204 05/21/19 0231 05/21/19 0409 05/22/19 0224  NA 141 136 137 138 139  K 4.5 4.1 4.0 4.2 3.9  CL 105  --   --  105 105  CO2 24  --   --  21* 24  GLUCOSE 144*  --   --  182* 122*  BUN 6  --   --  13 14  CREATININE 1.36*  --   --  1.52* 1.52*  CALCIUM 8.7*  --   --  8.7* 8.5*  MG  --   --   --  2.3  --    GFR: Estimated Creatinine Clearance: 59.1 mL/min (A) (by C-G formula based on SCr of 1.52 mg/dL (H)). Recent Labs  Lab 05/20/19 1155 05/20/19 2203 05/20/19 2338 05/20/19 2340 05/21/19 0409 05/22/19 0224  PROCALCITON  --   --   --  <0.10  --   --   WBC 13.6*  --   --   --  13.2* 18.1*  LATICACIDVEN  --  1.5 1.9  --   --   --     Liver Function Tests: Recent Labs  Lab 05/21/19 0409  AST 62*  ALT 138*  ALKPHOS 149*  BILITOT 0.9  PROT 6.4*  ALBUMIN 2.2*   No results for input(s): LIPASE, AMYLASE in the last 168 hours. No results for input(s): AMMONIA in the last 168 hours.  ABG    Component Value Date/Time   PHART 7.454 (H) 05/21/2019 0231   PCO2ART 32.1 05/21/2019 0231   PO2ART 57 (L) 05/21/2019 0231   HCO3 22.5 05/21/2019 0231   TCO2 23 05/21/2019 0231   ACIDBASEDEF 1.0 05/21/2019 0231   O2SAT 91.0 05/21/2019 0231     Coagulation Profile: Recent Labs  Lab 05/20/19 2340  INR 1.2    Cardiac Enzymes: No results for input(s): CKTOTAL, CKMB, CKMBINDEX, TROPONINI in the last 168 hours.  HbA1C: No results found for: HGBA1C  CBG: No results for input(s): GLUCAP in the last 168 hours.  Review of Systems: Positives in [place]  Gen: Denies fever, chills,  weight change, fatigue, night sweats HEENT: Denies blurred vision, double vision, hearing loss, tinnitus, sinus congestion, rhinorrhea, sore throat, neck stiffness, dysphagia PULM: Denies shortness of breath, cough, white sputum production, hemoptysis, wheezing CV: Denies chest pain, edema, orthopnea, paroxysmal nocturnal dyspnea, palpitations GI: Denies abdominal pain, nausea, vomiting, diarrhea, hematochezia, melena, constipation, change in bowel habits GU: Denies dysuria, hematuria, polyuria, oliguria, urethral discharge  Endocrine: Denies hot or cold intolerance, polyuria, polyphagia or appetite change Derm: Denies rash, dry skin, scaling or peeling skin change Heme: Denies easy bruising, bleeding, bleeding gums Neuro: Denies headache, numbness, weakness, slurred speech, loss of memory or consciousness   Past Medical History  She,  has a past medical history of Coronary artery disease and Hypertension.   Surgical History    Past Surgical History:  Procedure Laterality Date  . CESAREAN SECTION    . CORONARY ANGIOPLASTY WITH STENT PLACEMENT    . TUBAL LIGATION       Social History   reports that she has never smoked. She has never used smokeless tobacco. She reports that she does not drink alcohol or use drugs.   Family History   Her family history is not on file.   Allergies Allergies  Allergen Reactions  . Oxycodone-Acetaminophen Nausea And Vomiting  . Oxycodone Nausea And Vomiting     Home Medications  Prior to Admission medications   Medication Sig Start Date End Date Taking? Authorizing Provider  acetaminophen (TYLENOL) 500 MG tablet Take 1,000 mg by mouth every 6 (six) hours as needed for fever or headache (pain). For pain   Yes [provider]  albuterol (VENTOLIN HFA) 108 (90 Base) MCG/ACT inhaler Inhale 2 puffs into the lungs every 6 (six) hours as needed. Patient taking differently: Inhale 2 puffs into the lungs every 6 (six) hours as needed for wheezing  or shortness of breath.  05/12/19  Yes Ghimire, Werner Lean, MD  aspirin EC 81 MG tablet Take 81 mg by mouth daily.   Yes [provider]  benzonatate (TESSALON) 200 MG capsule Take 1 capsule (200 mg total) by mouth 3 (three) times daily as needed for cough. 05/12/19  Yes Ghimire, Werner Lean, MD  carvedilol (COREG) 6.25 MG tablet Take 1 tablet (6.25 mg total) by mouth 2 (two) times daily. 05/12/19 06/11/19 Yes Ghimire, Werner Lean, MD  etonogestrel (NEXPLANON) 68 MG IMPL implant 1 each by Subdermal route continuous. Implanted 2020    Yes [provider]  Multiple Vitamins-Minerals (EMERGEN-C IMMUNE PLUS/VIT D) CHEW Chew 1 tablet by mouth 2 (two) times daily.   Yes [provider]  sertraline (ZOLOFT) 50 MG tablet Take 50 mg by mouth daily. 04/28/19  Yes [provider]  dexamethasone (DECADRON) 6 MG tablet Take 1 tablet (6 mg total) by mouth daily. Patient not taking: Reported on 05/20/2019 05/13/19   Maretta Bees, MD  hydroxypropyl methylcellulose (ISOPTO TEARS) 2.5 % ophthalmic solution Place 1 drop into both eyes 4 (four) times daily as needed for dry eyes. Patient not taking: Reported on 05/20/2019 12/04/12   Bobbye Charleston, MD     Critical care time: n/a    Canary Brim, MSN, NP-C [facility] 05/22/2019, 11:30 AM   Please see [email or web address] for pager details.

## 2019-05-22 NOTE — Progress Notes (Addendum)
PROGRESS NOTE    Maria Bradford  YHC:[id number] DOB: 10/11/1980 DOA: 05/20/2019 PCP: Burnis Medin, PA-C  Brief Narrative:39 year old female with past medical history of coronary artery disease (inferior wall STEMI 05/2016, S/P angioplasty and stenting of RCA), VFib (post-STEMI complication, S/P Cardioversion x 3), hypertension, hyperlipidemia, obesity and recent diagnosis with COVID-19 on 4/19 requiring hospitalization at [facility] and discharged on 5/3 on 2 L of oxygen presented to the emergency room on 5/11 with progressive shortness of breath, which is slowly progressive over the week, reports ongoing cough intermittently productive of whitish to clear sputum, also complains of chest pain on the sides worsening with cough and inspiration, denies any fevers or chills, denies orthopnea PND. Work-up in the ED noted increasing oxygen requirement, CTA chest was negative for PE showed bilateral multifocal pneumonia consistent with Covid -She was started on broad-spectrum antibiotics to cover for secondary bacterial pneumonia   Assessment & Plan:     Acute respiratory failure with hypoxia (HCC) -Recent Covid pneumonia, diagnosed on 4/19, hospitalized from 4/26 through 5/3, treated with IV remdesivir for 5 days, 8 days of Decadron and subsequently discharged home on oral Decadron to complete 10-day course as well as 2 L of O2 -Readmitted with worsening dyspnea on exertion and hypoxemia -work-up is mostly notable for residual Covid pneumonia, she was started on broad-spectrum antibiotics on admission for secondary bacterial infection which will be continued, however her procalcitonin level was low, will repeat this -CTA chest negative for PE, notes multifocal pneumonia consistent with Covid, she completed her Covid isolation on 5/10, completed 10day course of Decadron and remdesivir -Clinically does not appear fluid overloaded however I diuresed her with low-dose Lasix to keep her net  negative -Continue incentive spirometry, Tessalon Perles -Increase activity, ambulation, wean O2 as tolerated -2D echo noted preserved EF, grade 1 diastolic dysfunction -will d/w Pulm, ? Role of longer term steroids, CRP was high yesterday, repeat this  History of CAD -History history of inferior STEMI in 2018, treated with 2 stents -Stable, no evidence of ACS, -Continue aspirin, Coreg, statin  Hypertension -Stable, continue Coreg  Obesity -BMI 36.6 -Lifestyle modification recommended   DVT prophylaxis: Lovenox Code Status: Full code Family Communication: Discussed with patient in detail, no family at bedside Disposition Plan: Home pending improvement in hypoxia Status is: Inpatient  Remains inpatient appropriate because: High oxygen requirement Dispo: The patient is from: Home              Anticipated d/c is to: Home              Anticipated d/c date is: 3 days              Patient currently is not medically stable to d/c.     Procedures:   Antimicrobials:    Subjective: -Feels about the same, continues to have dyspnea with minimal activity  Objective: Vitals:   05/21/19 1802 05/21/19 1928 05/21/19 2350 05/22/19 0447  BP: 121/72 125/85 (!) 114/100 (!) 145/98  Pulse: 89 97 (!) 104 95  Resp: (!) 31 20 20  (!) 22  Temp: 98.1 F (36.7 C) 98.3 F (36.8 C) 99.4 F (37.4 C) 98.7 F (37.1 C)  TempSrc: Oral Oral Axillary Oral  SpO2: 99% 92% 97% 94%  Weight:      Height:        Intake/Output Summary (Last 24 hours) at 05/22/2019 1013 Last data filed at 05/22/2019 0448 Gross per 24 hour  Intake 590 ml  Output 800 ml  Net -  210 ml   Filed Weights   05/20/19 1625  Weight: 103.1 kg    Examination:  General exam: Obese pleasant female sitting up in bed, AAOx3, dyspneic with minimal activity Respiratory system: Poor air movement bilaterally, no wheezes, bronchial breath sounds  CXR cardiovascular system: S1-S2, regular rate rhythm Gastrointestinal system:  Abdomen is nondistended, soft and nontender.Normal bowel sounds heard. Central nervous system: Alert and oriented. No focal neurological deficits. Extremities: no edema Skin: No rashes on exposed skin Psychiatry: Appropriate mood and affect    Data Reviewed:   CBC: Recent Labs  Lab 05/20/19 1155 05/20/19 2204 05/21/19 0231 05/21/19 0409 05/22/19 0224  WBC 13.6*  --   --  13.2* 18.1*  NEUTROABS  --   --   --  11.5*  --   HGB 12.3 12.9 12.2 11.8* 10.9*  HCT 39.5 38.0 36.0 36.8 34.0*  MCV 92.7  --   --  90.4 89.5  PLT 374  --   --  377 378   Basic Metabolic Panel: Recent Labs  Lab 05/20/19 1155 05/20/19 2204 05/21/19 0231 05/21/19 0409 05/22/19 0224  NA 141 136 137 138 139  K 4.5 4.1 4.0 4.2 3.9  CL 105  --   --  105 105  CO2 24  --   --  21* 24  GLUCOSE 144*  --   --  182* 122*  BUN 6  --   --  13 14  CREATININE 1.36*  --   --  1.52* 1.52*  CALCIUM 8.7*  --   --  8.7* 8.5*  MG  --   --   --  2.3  --    GFR: Estimated Creatinine Clearance: 59.1 mL/min (A) (by C-G formula based on SCr of 1.52 mg/dL (H)). Liver Function Tests: Recent Labs  Lab 05/21/19 0409  AST 62*  ALT 138*  ALKPHOS 149*  BILITOT 0.9  PROT 6.4*  ALBUMIN 2.2*   No results for input(s): LIPASE, AMYLASE in the last 168 hours. No results for input(s): AMMONIA in the last 168 hours. Coagulation Profile: Recent Labs  Lab 05/20/19 2340  INR 1.2   Cardiac Enzymes: No results for input(s): CKTOTAL, CKMB, CKMBINDEX, TROPONINI in the last 168 hours. BNP (last 3 results) No results for input(s): PROBNP in the last 8760 hours. HbA1C: No results for input(s): HGBA1C in the last 72 hours. CBG: No results for input(s): GLUCAP in the last 168 hours. Lipid Profile: No results for input(s): CHOL, HDL, LDLCALC, TRIG, CHOLHDL, LDLDIRECT in the last 72 hours. Thyroid Function Tests: No results for input(s): TSH, T4TOTAL, FREET4, T3FREE, THYROIDAB in the last 72 hours. Anemia Panel: Recent Labs     05/22/19 0828  FERRITIN 233   Urine analysis:    Component Value Date/Time   COLORURINE YELLOW 01/16/2014 0912   APPEARANCEUR CLOUDY (A) 01/16/2014 0912   LABSPEC 1.024 01/16/2014 0912   PHURINE 6.0 01/16/2014 0912   GLUCOSEU NEGATIVE 01/16/2014 0912   HGBUR NEGATIVE 01/16/2014 0912   BILIRUBINUR NEGATIVE 01/16/2014 0912   KETONESUR NEGATIVE 01/16/2014 0912   PROTEINUR 30 (A) 01/16/2014 0912   UROBILINOGEN 1.0 01/16/2014 0912   NITRITE NEGATIVE 01/16/2014 0912   LEUKOCYTESUR LARGE (A) 01/16/2014 0912   Sepsis Labs: @LABRCNTIP (procalcitonin:4,lacticidven:4)  ) Recent Results (from the past 240 hour(s))  Blood culture (routine x 2)     Status: None (Preliminary result)   Collection Time: 05/20/19 10:08 PM   Specimen: BLOOD RIGHT FOREARM  Result Value Ref Range Status  Specimen Description BLOOD RIGHT FOREARM  Final   Special Requests   Final    BOTTLES DRAWN AEROBIC AND ANAEROBIC Blood Culture adequate volume   Culture   Final    NO GROWTH 2 DAYS Performed at [facility], [address] [place], [place] [address]    Report Status PENDING  Incomplete  Expectorated sputum assessment w rflx to resp cult     Status: None   Collection Time: 05/21/19  3:06 AM   Specimen: Sputum  Result Value Ref Range Status   Specimen Description SPUTUM  Final   Special Requests NONE  Final   Sputum evaluation   Final    THIS SPECIMEN IS ACCEPTABLE FOR SPUTUM CULTURE Performed at [facility], [address], [place], [place] [address]    Report Status 05/21/2019 FINAL  Final  Culture, respiratory     Status: None (Preliminary result)   Collection Time: 05/21/19  3:06 AM   Specimen: SPU  Result Value Ref Range Status   Specimen Description SPUTUM  Final   Special Requests NONE Reflexed from [id number]  Final   Gram Stain   Final    RARE WBC PRESENT, PREDOMINANTLY PMN FEW GRAM POSITIVE COCCI IN CLUSTERS RARE GRAM NEGATIVE RODS RARE GRAM POSITIVE RODS    Culture   Final     CULTURE REINCUBATED FOR BETTER GROWTH Performed at [facility], [address] [place], [place] [address]    Report Status PENDING  Incomplete  Blood culture (routine x 2)     Status: None (Preliminary result)   Collection Time: 05/21/19  3:55 AM   Specimen: BLOOD  Result Value Ref Range Status   Specimen Description BLOOD LEFT ARM  Final   Special Requests   Final    BOTTLES DRAWN AEROBIC AND ANAEROBIC Blood Culture adequate volume   Culture   Final    NO GROWTH 1 DAY Performed at [facility], [address] [place], [place] [address]    Report Status PENDING  Incomplete  MRSA PCR Screening     Status: None   Collection Time: 05/21/19  1:45 PM   Specimen: Nasopharyngeal  Result Value Ref Range Status   MRSA by PCR NEGATIVE NEGATIVE Final    Comment:        The GeneXpert MRSA Assay (FDA approved for NASAL specimens only), is one component of a comprehensive MRSA colonization surveillance program. It is not intended to diagnose MRSA infection nor to guide or monitor treatment for MRSA infections. Performed at [facility], [address] [place], [place] [address]          Radiology Studies: DG Chest 1 View  Result Date: 05/21/2019 CLINICAL DATA:  COVID-19 pneumonia, shortness of breath EXAM: CHEST  1 VIEW COMPARISON:  05/20/2019 FINDINGS: Single frontal view of the chest demonstrates stable multifocal bilateral pneumonia. No effusion or pneumothorax. Cardiac silhouette is stable. IMPRESSION: 1. Stable bilateral multifocal pneumonia. Electronically Signed   By: Sharlet Salina M.D.   On: 05/21/2019 02:09   DG Chest 2 View  Result Date: 05/20/2019 CLINICAL DATA:  Chest pain EXAM: CHEST - 2 VIEW COMPARISON:  05/09/2018 FINDINGS: Diffuse bilateral airspace disease, right greater than left again noted, unchanged. Low lung volumes. Heart is normal size. No visible effusions or pneumothorax. No acute bony abnormality. IMPRESSION: Severe diffuse  bilateral airspace disease, right greater than left most compatible with pneumonia. No change. Electronically Signed   By: Charlett Nose  M.D.   On: 05/20/2019 12:51   CT Angio Chest PE W/Cm &/Or Wo Cm  Result Date: 05/20/2019 CLINICAL DATA:  COVID-19 pneumonia, shortness of breath EXAM: CT ANGIOGRAPHY CHEST WITH CONTRAST TECHNIQUE: Multidetector CT imaging of the chest was performed using the standard protocol during bolus administration of intravenous contrast. Multiplanar CT image reconstructions and MIPs were obtained to evaluate the vascular anatomy. CONTRAST:  100mL OMNIPAQUE IOHEXOL 350 MG/ML SOLN COMPARISON:  09/18/2016, 05/20/2019 FINDINGS: Cardiovascular: This is a technically adequate evaluation of the pulmonary vasculature. There are no filling defects or pulmonary emboli. Heart is unremarkable without pericardial effusion. Thoracic aorta is grossly normal without aneurysm or dissection. Mediastinum/Nodes: Mediastinal and right hilar adenopathy likely reactive. The thyroid, trachea, and esophagus are normal. Lungs/Pleura: There is multifocal bilateral airspace disease, right greater than left, consistent with multifocal pneumonia. No effusion or pneumothorax. Central airways are patent. Upper Abdomen: No acute abnormality. Musculoskeletal: No acute or destructive bony lesions. Reconstructed images demonstrate no additional findings. Review of the MIP images confirms the above findings. IMPRESSION: 1. No evidence of pulmonary embolus. 2. Multifocal bilateral pneumonia consistent with COVID-19. Electronically Signed   By: Sharlet SalinaMichael  Brown M.D.   On: 05/20/2019 20:59   ECHOCARDIOGRAM COMPLETE  Result Date: 05/21/2019    ECHOCARDIOGRAM REPORT   Patient Name:   Ralene Crymes Date of Exam: 05/21/2019 Medical Rec #:  [id number]    Height:       66.0 in Accession #:    [id number]   Weight:       227.3 lb Date of Birth:  Apr 05, 1980    BSA:          2.112 m Patient Age:    39 years     BP:           141/99 mmHg  Patient Gender: F            HR:           100 bpm. Exam Location:  Inpatient Procedure: 2D Echo, Cardiac Doppler and Color Doppler Indications:    CHF-Acute Systolic 428.21 / I50.21  History:        Patient has no prior history of Echocardiogram examinations.                 CAD; Risk Factors:Hypertension, Dyslipidemia and Non-Smoker.  Sonographer:    Renella CunasJulia Swaim RDCS Referring Phys: [phone] Canio Winokur IMPRESSIONS  1. Left ventricular ejection fraction, by estimation, is 65 to 70%. The left ventricle has normal function. The left ventricle has no regional wall motion abnormalities. Left ventricular diastolic parameters are consistent with Grade I diastolic dysfunction (impaired relaxation).  2. Right ventricular systolic function is normal. The right ventricular size is normal. There is normal pulmonary artery systolic pressure.  3. The mitral valve is normal in structure. Trivial mitral valve regurgitation. No evidence of mitral stenosis.  4. The aortic valve is normal in structure. Aortic valve regurgitation is not visualized. No aortic stenosis is present.  5. The inferior vena cava is normal in size with greater than 50% respiratory variability, suggesting right atrial pressure of 3 mmHg. FINDINGS  Left Ventricle: Left ventricular ejection fraction, by estimation, is 65 to 70%. The left ventricle has normal function. The left ventricle has no regional wall motion abnormalities. The left ventricular internal cavity size was normal in size. There is  no left ventricular hypertrophy. Left ventricular diastolic parameters are consistent with Grade I diastolic dysfunction (impaired relaxation). Normal left ventricular filling pressure. Right Ventricle: The  right ventricular size is normal. No increase in right ventricular wall thickness. Right ventricular systolic function is normal. There is normal pulmonary artery systolic pressure. Left Atrium: Left atrial size was normal in size. Right Atrium: Right atrial size  was normal in size. Pericardium: There is no evidence of pericardial effusion. Mitral Valve: The mitral valve is normal in structure. Normal mobility of the mitral valve leaflets. Trivial mitral valve regurgitation. No evidence of mitral valve stenosis. Tricuspid Valve: The tricuspid valve is normal in structure. Tricuspid valve regurgitation is not demonstrated. No evidence of tricuspid stenosis. Aortic Valve: The aortic valve is normal in structure. Aortic valve regurgitation is not visualized. No aortic stenosis is present. Pulmonic Valve: The pulmonic valve was normal in structure. Pulmonic valve regurgitation is not visualized. No evidence of pulmonic stenosis. Aorta: The aortic root is normal in size and structure. Venous: The inferior vena cava is normal in size with greater than 50% respiratory variability, suggesting right atrial pressure of 3 mmHg. IAS/Shunts: No atrial level shunt detected by color flow Doppler.  LEFT VENTRICLE PLAX 2D LVIDd:         3.89 cm     Diastology LVIDs:         2.70 cm     LV e' lateral:   9.46 cm/s LV PW:         0.91 cm     LV E/e' lateral: 6.6 LV IVS:        0.93 cm     LV e' medial:    5.87 cm/s LVOT diam:     1.70 cm     LV E/e' medial:  10.7 LV SV:         72 LV SV Index:   34 LVOT Area:     2.27 cm  LV Volumes (MOD) LV vol d, MOD A2C: 63.0 ml LV vol d, MOD A4C: 80.8 ml LV vol s, MOD A2C: 20.2 ml LV vol s, MOD A4C: 24.0 ml LV SV MOD A2C:     42.8 ml LV SV MOD A4C:     80.8 ml LV SV MOD BP:      48.4 ml RIGHT VENTRICLE TAPSE (M-mode): 1.6 cm LEFT ATRIUM             Index       RIGHT ATRIUM          Index LA diam:        3.60 cm 1.70 cm/m  RA Area:     7.63 cm LA Vol (A2C):   18.0 ml 8.52 ml/m  RA Volume:   13.60 ml 6.44 ml/m LA Vol (A4C):   21.8 ml 10.32 ml/m LA Biplane Vol: 21.1 ml 9.99 ml/m  AORTIC VALVE LVOT Vmax:   161.00 cm/s LVOT Vmean:  110.000 cm/s LVOT VTI:    0.319 m  AORTA Ao Root diam: 3.00 cm MITRAL VALVE MV Area (PHT): 4.29 cm    SHUNTS MV Decel Time:  177 msec    Systemic VTI:  0.32 m MV E velocity: 62.60 cm/s  Systemic Diam: 1.70 cm MV A velocity: 78.40 cm/s MV E/A ratio:  0.80 Tobias Alexander MD Electronically signed by Tobias Alexander MD Signature Date/Time: 05/21/2019/12:50:14 PM    Final         Scheduled Meds: . aspirin EC  81 mg Oral Daily  . atorvastatin  80 mg Oral Daily  . benzonatate  200 mg Oral BID  . carvedilol  6.25 mg Oral BID  .  enoxaparin (LOVENOX) injection  40 mg Subcutaneous Daily  . sertraline  50 mg Oral Daily  . sodium chloride flush  3 mL Intravenous Once   Continuous Infusions: . azithromycin 500 mg (05/21/19 2304)  . cefTRIAXone (ROCEPHIN)  IV 2 g (05/21/19 2216)     LOS: 2 days    Time spent:  Zannie Cove, MD [facility]  05/22/2019, 10:13 AM

## 2019-05-23 DIAGNOSIS — U071 COVID-19: Secondary | ICD-10-CM

## 2019-05-23 DIAGNOSIS — J1282 Pneumonia due to coronavirus disease 2019: Secondary | ICD-10-CM

## 2019-05-23 LAB — CBC
HCT: 35.5 % — ABNORMAL LOW (ref 36.0–46.0)
Hemoglobin: 11.3 g/dL — ABNORMAL LOW (ref 12.0–15.0)
MCH: 29.2 pg (ref 26.0–34.0)
MCHC: 31.8 g/dL (ref 30.0–36.0)
MCV: 91.7 fL (ref 80.0–100.0)
Platelets: 315 10*3/uL (ref 150–400)
RBC: 3.87 MIL/uL (ref 3.87–5.11)
RDW: 14.7 % (ref 11.5–15.5)
WBC: 7.3 10*3/uL (ref 4.0–10.5)
nRBC: 0 % (ref 0.0–0.2)

## 2019-05-23 LAB — BASIC METABOLIC PANEL
Anion gap: 11 (ref 5–15)
BUN: 11 mg/dL (ref 6–20)
CO2: 26 mmol/L (ref 22–32)
Calcium: 8.4 mg/dL — ABNORMAL LOW (ref 8.9–10.3)
Chloride: 102 mmol/L (ref 98–111)
Creatinine, Ser: 1.56 mg/dL — ABNORMAL HIGH (ref 0.44–1.00)
GFR calc Af Amer: 47 mL/min — ABNORMAL LOW (ref >60)
GFR calc non Af Amer: 41 mL/min — ABNORMAL LOW (ref >60)
Glucose, Bld: 105 mg/dL — ABNORMAL HIGH (ref 70–99)
Potassium: 4.2 mmol/L (ref 3.5–5.1)
Sodium: 139 mmol/L (ref 135–145)

## 2019-05-23 LAB — CULTURE, RESPIRATORY W GRAM STAIN: Culture: NORMAL

## 2019-05-23 NOTE — Progress Notes (Signed)
NAME:  Maria Bradford, MRN:  [id number], DOB:  10-22-1980, LOS: 3 ADMISSION DATE:  05/20/2019, CONSULTATION DATE:  05/22/19 REFERRING MD:  Dr. Jomarie Longs, CHIEF COMPLAINT:  SOB   Brief History   39 y/o F admitted 5/11 with reports of progressive shortness of breath.  Hx of post-COVID.  PE ruled out but imaging shows dense R>L infiltrate worrisome for post viral bacterial PNA. Increased O2 needs.   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   She was diagnosed with COVID on 4/19 and hospitalized from 4/26 - 5/3.  During that hospitalization she was treated with IV remdesivir (5 days), decadron and oxygen.  She was discharged on oral decadron for 10 days total and 2L O2.   She reports she initially felt better but over the week prior to admit she developed progressive shortness of breath, increased cough with white sputum production and pleuritic chest pain. ER evaluation included a CTA of the chest which was negative for PE but showed R>L multifocal infiltrates with mediastinal and right hilar adenopathy.  The patient was admitted per [facility] and treated for pneumonia with rocephin and azithromycin. She had increased O2 needs and PCCM consulted for evaluation.   Past Medical History  COVID - positive 04/28/19, required admission, treated with decadron, remdesivir, O2 HTN CAD  Significant Hospital Events   5/11 Admit  5/13 PCCM consulted   Consults:  PCCM   Procedures:    Significant Diagnostic Tests:   CTA Chest 5/13 >> negative for PE but showed R>L multifocal infiltrates with mediastinal and right hilar adenopathy.    ECHO 5/12 >> LVEF 65-80%, normal LV function, no WMA, LV diastolic parameters consistent with grade I diastolic dysfunction, RV systolic function normal  Micro Data:  Sputum 5/12 >> positive cocci gram-negative rods>> BCx2 5/11 >>  U. Strep Antigen 5/11 >> negative    Antimicrobials:  Rocephin 5/11 >>  Azithromycin 5/11 >>    Interim history/subjective:  Reports feeling better.  FiO2 needs have been decreased  Objective   Blood pressure 120/71, pulse 90, temperature 97.7 F (36.5 C), temperature source Oral, resp. rate 18, height 5\' 6"  (1.676 m), weight 103.1 kg, SpO2 96 %.        Intake/Output Summary (Last 24 hours) at 05/23/2019 1056 Last data filed at 05/23/2019 1000 Gross per 24 hour  Intake 350 ml  Output 950 ml  Net -600 ml   Filed Weights   05/20/19 1625  Weight: 103.1 kg    Examination: General: Obese female who reports being better with O2 needs down from 8 L to 4 L/min  HEENT: No JVD lymphadenopathy is appreciated Neuro: Grossly intact CV: Heart sounds are regular PULM: Coarse high-pitched cough barking in nature is nonproductive.  Rapid respirations GI: soft, bsx4 active  Extremities: warm/dry,  edema  Skin: no rashes or lesions   Resolved Hospital Problem list     Assessment & Plan:   Acute Hypoxic Respiratory Failure in setting of Bilateral Infiltrates Post COVID infection (dx 4/19) with treatment with decadron, remdesivir and oxygen. She did not receive Actemra.  Discharged on 2L O2.  PE ruled out on CTA.  Imaging reviewed and shows dense R>L infiltrates with mediastinal and right hilar adenopathy.  Concern for possible post viral bacterial PNA. Strep antigen negative.   Wean FiO2 as tolerated Agree with empirical antibiotic Pulmonary toilet Cough suppression with  Tussionex Pulmonary critical care will follow up as needed     Best practice:  Diet: as tolerated  DVT prophylaxis: lovenox  GI prophylaxis: n/a  Glucose control: per primary  Mobility: as tolerated  Code Status: Full Code  Family Communication: Patient updated on plan of care 5/14 at bedside Disposition: Floor, per [facility]   Labs   CBC: Recent Labs  Lab 05/20/19 1155 05/20/19 1155 05/20/19 2204 05/21/19 0231 05/21/19 0409 05/22/19 0224  05/23/19 0923  WBC 13.6*  --   --   --  13.2* 18.1* 7.3  NEUTROABS  --   --   --   --  11.5*  --   --   HGB 12.3   < > 12.9 12.2 11.8* 10.9* 11.3*  HCT 39.5   < > 38.0 36.0 36.8 34.0* 35.5*  MCV 92.7  --   --   --  90.4 89.5 91.7  PLT 374  --   --   --  377 346 315   < > = values in this interval not displayed.    Basic Metabolic Panel: Recent Labs  Lab 05/20/19 1155 05/20/19 1155 05/20/19 2204 05/21/19 0231 05/21/19 0409 05/22/19 0224 05/23/19 0923  NA 141   < > 136 137 138 139 139  K 4.5   < > 4.1 4.0 4.2 3.9 4.2  CL 105  --   --   --  105 105 102  CO2 24  --   --   --  21* 24 26  GLUCOSE 144*  --   --   --  182* 122* 105*  BUN 6  --   --   --  13 14 11   CREATININE 1.36*  --   --   --  1.52* 1.52* 1.56*  CALCIUM 8.7*  --   --   --  8.7* 8.5* 8.4*  MG  --   --   --   --  2.3  --   --    < > = values in this interval not displayed.   GFR: Estimated Creatinine Clearance: 57.5 mL/min (A) (by C-G formula based on SCr of 1.56 mg/dL (H)). Recent Labs  Lab 05/20/19 1155 05/20/19 2203 05/20/19 2338 05/20/19 2340 05/21/19 0409 05/22/19 0224 05/23/19 0923  PROCALCITON  --   --   --  <0.10  --   --   --   WBC 13.6*  --   --   --  13.2* 18.1* 7.3  LATICACIDVEN  --  1.5 1.9  --   --   --   --     Liver Function Tests: Recent Labs  Lab 05/21/19 0409  AST 62*  ALT 138*  ALKPHOS 149*  BILITOT 0.9  PROT 6.4*  ALBUMIN 2.2*   No results for input(s): LIPASE, AMYLASE in the last 168 hours. No results for input(s): AMMONIA in the last 168 hours.  ABG    Component Value Date/Time   PHART 7.454 (H) 05/21/2019 0231   PCO2ART 32.1 05/21/2019 0231   PO2ART 57 (L) 05/21/2019 0231   HCO3 22.5 05/21/2019 0231   TCO2 23 05/21/2019 0231   ACIDBASEDEF 1.0 05/21/2019 0231   O2SAT 91.0 05/21/2019 0231     Coagulation Profile: Recent Labs  Lab 05/20/19 2340  INR 1.2    Cardiac Enzymes: No results for input(s): CKTOTAL, CKMB, CKMBINDEX, TROPONINI in the last 168  hours.  HbA1C: No results found for: HGBA1C  CBG: No results for input(s): GLUCAP in the  last 168 hours.    Critical care time: n/a    Brett Canales Sade Hollon ACNP Acute Care Nurse Practitioner [facility] Pulmonary/Critical Care Please consult Amion 05/23/2019, 10:57 AM

## 2019-05-23 NOTE — Evaluation (Signed)
Physical Therapy Evaluation Patient Details Name: Maria Bradford MRN: [id number] DOB: [birth year] Today's Date: 05/23/2019   History of Present Illness  Pt is 39 yo female with PMH including CAD with STEMI in 2018, s/p angioplasty and stenting, HTN, hyperlipidemia, and obesity.  Pt with COVID on 4/19 requiring hospitalization and d/c on 5/3.  Pt returned to hospital on 5/11 with progressive SHOB.  CTA - for PE but with multifocal PNE.  Clinical Impression  Pt admitted with above diagnosis. Pt is normally very independent and active; since having COVID on 04/28/19 pt presenting with decreased mobility.  Today pt with significantly decreased endurance with O2 sats dropping to 87% on 8 L HFNC for 20' of ambulation.  Required skilled PT for cues for breathing technique, transfer technique, and monitoring of sats with mobility progression.  Pt currently with functional limitations due to the deficits listed below (see PT Problem List). Pt will benefit from skilled PT to increase their independence and safety with mobility to allow discharge to the venue listed below.       Follow Up Recommendations Home health PT(pending progress)    Equipment Recommendations  None recommended by PT    Recommendations for Other Services       Precautions / Restrictions Precautions Precautions: None Restrictions Weight Bearing Restrictions: No      Mobility  Bed Mobility               General bed mobility comments: in chair at arrival  Transfers Overall transfer level: Needs assistance Equipment used: None Transfers: Sit to/from Stand Sit to Stand: Supervision         General transfer comment: supervision for safety  Ambulation/Gait Ambulation/Gait assistance: Min guard Gait Distance (Feet): 20 Feet(x2) Assistive device: Rolling walker (2 wheeled) Gait Pattern/deviations: Step-through pattern Gait velocity: decreased   General Gait Details: Slow gait with DOE of 3/4, RR 35-40 with  walking, on 8 L HFNC for walking with sats down to 87%; required seated rest break between bouts of ambulate.  Self rated RPE on 0-10 scale was 7  Stairs            Wheelchair Mobility    Modified Rankin (Stroke Patients Only)       Balance Overall balance assessment: Mild deficits observed, not formally tested                                           Pertinent Vitals/Pain Pain Assessment: No/denies pain    Home Living Family/patient expects to be discharged to:: Private residence Living Arrangements: Children(2 teenagers) Available Help at Discharge: Family;Available PRN/intermittently Type of Home: House Home Access: Stairs to enter   [employer] of Steps: 1 Home Layout: One level   Additional Comments: After recent hospital admission home on 2 LPM    Prior Function Level of Independence: Independent         Comments: supposed to start new job prior to getting COVID - since having COVID endurance limited and daughter assiting with ADLs at home     Hand Dominance        Extremity/Trunk Assessment   Upper Extremity Assessment Upper Extremity Assessment: Overall WFL for tasks assessed    Lower Extremity Assessment Lower Extremity Assessment: Overall WFL for tasks assessed    Cervical / Trunk Assessment Cervical / Trunk Assessment: Normal  Communication   Communication: No difficulties  Cognition  Arousal/Alertness: Awake/alert Behavior During Therapy: WFL for tasks assessed/performed Overall Cognitive Status: Within Functional Limits for tasks assessed                                        General Comments General comments (skin integrity, edema, etc.): Pt on 7 L HFNC upon entry with sats 93%.  Placed on 8 L HFNC (7 not option on tank) for ambulation with sats down to 87% and take 1-2 minutes to rebound to 90%.  Placed back on 7 L HFNC at rest.    Exercises     Assessment/Plan    PT Assessment  Patient needs continued PT services  PT Problem List Decreased strength;Decreased activity tolerance;Cardiopulmonary status limiting activity;Decreased mobility;Decreased knowledge of use of DME;Decreased balance       PT Treatment Interventions DME instruction;Gait training;Functional mobility training;Therapeutic activities;Therapeutic exercise;Balance training;Cognitive remediation;Patient/family education;Stair training    PT Goals (Current goals can be found in the Care Plan section)  Acute Rehab PT Goals Patient Stated Goal: breathe better PT Goal Formulation: With patient Time For Goal Achievement: 06/06/19 Potential to Achieve Goals: Good    Frequency Min 3X/week   Barriers to discharge        Co-evaluation               AM-PAC PT "6 Clicks" Mobility  Outcome Measure Help needed turning from your back to your side while in a flat bed without using bedrails?: None Help needed moving from lying on your back to sitting on the side of a flat bed without using bedrails?: None Help needed moving to and from a bed to a chair (including a wheelchair)?: None Help needed standing up from a chair using your arms (e.g., wheelchair or bedside chair)?: None Help needed to walk in hospital room?: A Little Help needed climbing 3-5 steps with a railing? : A Little 6 Click Score: 22    End of Session Equipment Utilized During Treatment: Oxygen Activity Tolerance: Patient limited by fatigue Patient left: in chair;with call bell/phone within reach Nurse Communication: Mobility status PT Visit Diagnosis: Other abnormalities of gait and mobility (R26.89);Muscle weakness (generalized) (M62.81)    Time: 6270-3500 PT Time Calculation (min) (ACUTE ONLY): 22 min   Charges:   PT Evaluation $PT Eval Moderate Complexity: 1 Mod          Maggie Font, PT Acute Rehab Services Pager [phone] [facility] [phone] [facility] [phone]   Maria Bradford 05/23/2019, 5:14 PM

## 2019-05-23 NOTE — Progress Notes (Signed)
PROGRESS NOTE    Maria Bradford  WUX:[id number] DOB: 1980/02/20 DOA: 05/20/2019 PCP: Burnis Medin, PA-C  Brief Narrative:39 year old female with past medical history of coronary artery disease (inferior wall STEMI 05/2016, S/P angioplasty and stenting of RCA), VFib (post-STEMI complication, S/P Cardioversion x 3), hypertension, hyperlipidemia, obesity and recent diagnosis with COVID-19 on 4/19 requiring hospitalization at [facility] and discharged on 5/3 on 2 L of oxygen presented to the emergency room on 5/11 with progressive shortness of breath, which is slowly progressive over the week, reports ongoing cough intermittently productive of whitish to clear sputum, also complains of chest pain on the sides worsening with cough and inspiration, denies any fevers or chills, denies orthopnea PND. Work-up in the ED noted increasing oxygen requirement, CTA chest was negative for PE showed bilateral multifocal pneumonia. -She was started on broad-spectrum antibiotics to cover for secondary bacterial pneumonia -Pulm following  Assessment & Plan:     Acute respiratory failure with hypoxia (HCC) -Recent Covid pneumonia, diagnosed on 4/19, hospitalized from 4/26 through 5/3, treated with IV remdesivir for 5 days, 8 days of Decadron and subsequently discharged home on oral Decadron to complete 10-day course as well as 2 L of O2 -CTA chest negative for PE, notes multifocal pneumonia consistent with Covid, she completed her Covid isolation on 5/10, completed 10day course of Decadron and remdesivir -Started on IV ceftriaxone and azithromycin on admission for possible secondary bacterial pneumonia, day 3 of antibiotics now -Clinically is not fluid overloaded -Continue incentive spirometry, Tessalon Perles -Increase activity, ambulation, wean O2 as tolerated -2D echo noted preserved EF, grade 1 diastolic dysfunction -Appreciate pulmonary input, will need close pulmonary follow-up  History of CAD -History  history of inferior STEMI in 2018, treated with 2 stents -Stable, no evidence of ACS, -Continue aspirin, Coreg, statin  Hypertension -Stable, continue Coreg  Obesity -BMI 36.6 -Lifestyle modification recommended   DVT prophylaxis: Lovenox Code Status: Full code Family Communication: Discussed with patient in detail, no family at bedside Disposition Plan: Home pending improvement in hypoxia Status is: Inpatient  Remains inpatient appropriate because: High oxygen requirement Dispo: The patient is from: Home              Anticipated d/c is to: Home              Anticipated d/c date is: 2-3 days              Patient currently is not medically stable to d/c.     Procedures:   Antimicrobials:    Subjective: -Denies any improvement, continues to have dyspnea with minimal activity and cough  Objective: Vitals:   05/22/19 1000 05/22/19 1330 05/22/19 1941 05/22/19 2334  BP:  107/68 111/83 120/71  Pulse: 89 97 83 90  Resp:  16 17 18   Temp:  98.2 F (36.8 C) 98.5 F (36.9 C) 97.7 F (36.5 C)  TempSrc:  Oral Oral Oral  SpO2: 96% 100% 95% 96%  Weight:      Height:        Intake/Output Summary (Last 24 hours) at 05/23/2019 1106 Last data filed at 05/23/2019 1000 Gross per 24 hour  Intake 350 ml  Output 950 ml  Net -600 ml   Filed Weights   05/20/19 1625  Weight: 103.1 kg    Examination:  General exam: Obese pleasant female sitting up in bed, AAOx3, dyspneic with minimal activity HEENT, neck obese unable to assess JVD CVS S1-S2 regular rate rhythm Lungs with poor air movement bilaterally, bronchial  breath sounds Abdomen is obese soft nontender with positive bowel sounds Extremities with no edema Skin: No rashes on exposed skin Psychiatry: Appropriate mood and affect    Data Reviewed:   CBC: Recent Labs  Lab 05/20/19 1155 05/20/19 1155 05/20/19 2204 05/21/19 0231 05/21/19 0409 05/22/19 0224 05/23/19 0923  WBC 13.6*  --   --   --  13.2* 18.1* 7.3    NEUTROABS  --   --   --   --  11.5*  --   --   HGB 12.3   < > 12.9 12.2 11.8* 10.9* 11.3*  HCT 39.5   < > 38.0 36.0 36.8 34.0* 35.5*  MCV 92.7  --   --   --  90.4 89.5 91.7  PLT 374  --   --   --  377 346 315   < > = values in this interval not displayed.   Basic Metabolic Panel: Recent Labs  Lab 05/20/19 1155 05/20/19 1155 05/20/19 2204 05/21/19 0231 05/21/19 0409 05/22/19 0224 05/23/19 0923  NA 141   < > 136 137 138 139 139  K 4.5   < > 4.1 4.0 4.2 3.9 4.2  CL 105  --   --   --  105 105 102  CO2 24  --   --   --  21* 24 26  GLUCOSE 144*  --   --   --  182* 122* 105*  BUN 6  --   --   --  13 14 11   CREATININE 1.36*  --   --   --  1.52* 1.52* 1.56*  CALCIUM 8.7*  --   --   --  8.7* 8.5* 8.4*  MG  --   --   --   --  2.3  --   --    < > = values in this interval not displayed.   GFR: Estimated Creatinine Clearance: 57.5 mL/min (A) (by C-G formula based on SCr of 1.56 mg/dL (H)). Liver Function Tests: Recent Labs  Lab 05/21/19 0409  AST 62*  ALT 138*  ALKPHOS 149*  BILITOT 0.9  PROT 6.4*  ALBUMIN 2.2*   No results for input(s): LIPASE, AMYLASE in the last 168 hours. No results for input(s): AMMONIA in the last 168 hours. Coagulation Profile: Recent Labs  Lab 05/20/19 2340  INR 1.2   Cardiac Enzymes: No results for input(s): CKTOTAL, CKMB, CKMBINDEX, TROPONINI in the last 168 hours. BNP (last 3 results) No results for input(s): PROBNP in the last 8760 hours. HbA1C: No results for input(s): HGBA1C in the last 72 hours. CBG: No results for input(s): GLUCAP in the last 168 hours. Lipid Profile: No results for input(s): CHOL, HDL, LDLCALC, TRIG, CHOLHDL, LDLDIRECT in the last 72 hours. Thyroid Function Tests: No results for input(s): TSH, T4TOTAL, FREET4, T3FREE, THYROIDAB in the last 72 hours. Anemia Panel: Recent Labs    05/22/19 0828  FERRITIN 233   Urine analysis:    Component Value Date/Time   COLORURINE YELLOW 01/16/2014 0912   APPEARANCEUR  CLOUDY (A) 01/16/2014 0912   LABSPEC 1.024 01/16/2014 0912   PHURINE 6.0 01/16/2014 0912   GLUCOSEU NEGATIVE 01/16/2014 0912   HGBUR NEGATIVE 01/16/2014 0912   BILIRUBINUR NEGATIVE 01/16/2014 0912   KETONESUR NEGATIVE 01/16/2014 0912   PROTEINUR 30 (A) 01/16/2014 0912   UROBILINOGEN 1.0 01/16/2014 0912   NITRITE NEGATIVE 01/16/2014 0912   LEUKOCYTESUR LARGE (A) 01/16/2014 0912   Sepsis Labs: @LABRCNTIP (procalcitonin:4,lacticidven:4)  ) Recent Results (from the  past 240 hour(s))  Blood culture (routine x 2)     Status: None (Preliminary result)   Collection Time: 05/20/19 10:08 PM   Specimen: BLOOD RIGHT FOREARM  Result Value Ref Range Status   Specimen Description BLOOD RIGHT FOREARM  Final   Special Requests   Final    BOTTLES DRAWN AEROBIC AND ANAEROBIC Blood Culture adequate volume   Culture   Final    NO GROWTH 2 DAYS Performed at [facility], [address] [place], [place] [address]    Report Status PENDING  Incomplete  Expectorated sputum assessment w rflx to resp cult     Status: None   Collection Time: 05/21/19  3:06 AM   Specimen: Sputum  Result Value Ref Range Status   Specimen Description SPUTUM  Final   Special Requests NONE  Final   Sputum evaluation   Final    THIS SPECIMEN IS ACCEPTABLE FOR SPUTUM CULTURE Performed at [facility], [address] [place], [place] [address]    Report Status 05/21/2019 FINAL  Final  Culture, respiratory     Status: None   Collection Time: 05/21/19  3:06 AM   Specimen: SPU  Result Value Ref Range Status   Specimen Description SPUTUM  Final   Special Requests NONE Reflexed from [id number]  Final   Gram Stain   Final    RARE WBC PRESENT, PREDOMINANTLY PMN FEW GRAM POSITIVE COCCI IN CLUSTERS RARE GRAM NEGATIVE RODS RARE GRAM POSITIVE RODS    Culture   Final    Consistent with normal respiratory flora. Performed at [facility], [address], [place], [place] [address]    Report Status 05/23/2019  FINAL  Final  Blood culture (routine x 2)     Status: None (Preliminary result)   Collection Time: 05/21/19  3:55 AM   Specimen: BLOOD  Result Value Ref Range Status   Specimen Description BLOOD LEFT ARM  Final   Special Requests   Final    BOTTLES DRAWN AEROBIC AND ANAEROBIC Blood Culture adequate volume   Culture   Final    NO GROWTH 1 DAY Performed at [facility], [address] [place], [place] [address]    Report Status PENDING  Incomplete  MRSA PCR Screening     Status: None   Collection Time: 05/21/19  1:45 PM   Specimen: Nasopharyngeal  Result Value Ref Range Status   MRSA by PCR NEGATIVE NEGATIVE Final    Comment:        The GeneXpert MRSA Assay (FDA approved for NASAL specimens only), is one component of a comprehensive MRSA colonization surveillance program. It is not intended to diagnose MRSA infection nor to guide or monitor treatment for MRSA infections. Performed at [facility], [address], [place], [place] [address]          Radiology Studies: No results found.      Scheduled Meds: . aspirin EC  81 mg Oral Daily  . atorvastatin  80 mg Oral Daily  . benzonatate  200 mg Oral BID  . carvedilol  6.25 mg Oral BID  . enoxaparin (LOVENOX) injection  40 mg Subcutaneous Daily  . sertraline  50 mg Oral Daily  . sodium chloride flush  3 mL Intravenous Once   Continuous Infusions: . azithromycin 500 mg (05/22/19 2203)  . cefTRIAXone (ROCEPHIN)  IV 2 g (05/22/19 2110)     LOS: 3 days    Time spent:  Zannie Cove, MD [facility]  05/23/2019, 11:06 AM

## 2019-05-24 MED ORDER — FUROSEMIDE 40 MG PO TABS
40.0000 mg | ORAL_TABLET | Freq: Every day | ORAL | Status: DC
  Administered 2019-05-24 – 2019-05-25 (×2): 40 mg via ORAL
  Filled 2019-05-24 (×2): qty 1

## 2019-05-24 MED ORDER — ENOXAPARIN SODIUM 40 MG/0.4ML ~~LOC~~ SOLN
40.0000 mg | Freq: Two times a day (BID) | SUBCUTANEOUS | Status: DC
  Administered 2019-05-24 – 2019-05-25 (×2): 40 mg via SUBCUTANEOUS
  Filled 2019-05-24 (×2): qty 0.4

## 2019-05-24 NOTE — Progress Notes (Signed)
PROGRESS NOTE    Maria Bradford  GMW:[id number] DOB: 1980-08-27 DOA: 05/20/2019 PCP: Elisabeth Cara, PA-C  Brief Narrative:39 year old female with past medical history of coronary artery disease (inferior wall STEMI 05/2016, S/P angioplasty and stenting of RCA), VFib (post-STEMI complication, S/P Cardioversion x 3), hypertension, hyperlipidemia, obesity and recent diagnosis with COVID-19 on 4/19 requiring hospitalization at [facility] and discharged on 5/3 on 2 L of oxygen presented to the emergency room on 5/11 with progressive shortness of breath, which is slowly progressive over the week, reports ongoing cough intermittently productive of whitish to clear sputum, also complains of chest pain on the sides worsening with cough and inspiration, denies any fevers or chills, denies orthopnea PND. Work-up in the ED noted increasing oxygen requirement, CTA chest was negative for PE showed bilateral multifocal pneumonia. -She was started on broad-spectrum antibiotics to cover for secondary bacterial pneumonia -Pulm following  Assessment & Plan:     Acute respiratory failure with hypoxia (HCC) -Recent Covid pneumonia, diagnosed on 4/19, hospitalized from 4/26 through 5/3, treated with IV remdesivir for 5 days, 8 days of Decadron and subsequently discharged home on oral Decadron to complete 10-day course as well as 2 L of O2 -CTA chest negative for PE, notes multifocal pneumonia consistent with Covid, she completed her Covid isolation on 5/10, completed 10day course of Decadron and remdesivir -Started on IV ceftriaxone and azithromycin on admission for possible secondary bacterial pneumonia, day 4 of antibiotics now -Clinically is not fluid overloaded, continue oral Lasix to keep euvolemic status -Unfortunately no considerable improvement -Appreciate pulmonary input -Recheck CRP and procalcitonin tomorrow -Ambulate, PT assistance, wean O2 as tolerated  History of CAD -History history of inferior  STEMI in 2018, treated with 2 stents -Stable, no evidence of ACS, -Continue aspirin, Coreg, statin  Hypertension -Stable, continue Coreg  Obesity -BMI 36.6 -Lifestyle modification recommended   DVT prophylaxis: Lovenox Code Status: Full code Family Communication: Discussed with patient in detail, no family at bedside Disposition Plan: Home pending improvement in hypoxia Status is: Inpatient  Remains inpatient appropriate because: High oxygen requirement, significant dyspnea Dispo: The patient is from: Home              Anticipated d/c is to: Home              Anticipated d/c date is: 3-4 days              Patient currently is not medically stable to d/c.     Procedures:   Antimicrobials:    Subjective: -Continues to feel weak, short of breath even talking, tried to sit up in the recliner yesterday, oral intake is poor but improving  Objective: Vitals:   05/23/19 2353 05/24/19 0348 05/24/19 0811 05/24/19 1200  BP: 128/89 126/87 116/83 124/89  Pulse: 85 82 91 93  Resp: 20  17 (!) 22  Temp: 99.9 F (37.7 C) 99.3 F (37.4 C) 98.7 F (37.1 C) 98.1 F (36.7 C)  TempSrc: Oral Oral Oral Oral  SpO2: 100% 97% 95% 92%  Weight:      Height:        Intake/Output Summary (Last 24 hours) at 05/24/2019 1209 Last data filed at 05/24/2019 1200 Gross per 24 hour  Intake --  Output 1200 ml  Net -1200 ml   Filed Weights   05/20/19 1625  Weight: 103.1 kg    Examination:  General, obese pleasant female sitting up in bed, AAOx3, dyspneic with minimal activity HEENT neck obese unable to assess JVD  CVS S1-S2 regular rate rhythm Lungs with poor air movement bilaterally, bronchial breath sounds Abdomen is obese soft nontender positive bowel sounds Extremities no edema Skin: No rashes on exposed skin Psychiatry: Appropriate mood and affect    Data Reviewed:   CBC: Recent Labs  Lab 05/20/19 1155 05/20/19 1155 05/20/19 2204 05/21/19 0231 05/21/19 0409  05/22/19 0224 05/23/19 0923  WBC 13.6*  --   --   --  13.2* 18.1* 7.3  NEUTROABS  --   --   --   --  11.5*  --   --   HGB 12.3   < > 12.9 12.2 11.8* 10.9* 11.3*  HCT 39.5   < > 38.0 36.0 36.8 34.0* 35.5*  MCV 92.7  --   --   --  90.4 89.5 91.7  PLT 374  --   --   --  377 346 315   < > = values in this interval not displayed.   Basic Metabolic Panel: Recent Labs  Lab 05/20/19 1155 05/20/19 1155 05/20/19 2204 05/21/19 0231 05/21/19 0409 05/22/19 0224 05/23/19 0923  NA 141   < > 136 137 138 139 139  K 4.5   < > 4.1 4.0 4.2 3.9 4.2  CL 105  --   --   --  105 105 102  CO2 24  --   --   --  21* 24 26  GLUCOSE 144*  --   --   --  182* 122* 105*  BUN 6  --   --   --  13 14 11   CREATININE 1.36*  --   --   --  1.52* 1.52* 1.56*  CALCIUM 8.7*  --   --   --  8.7* 8.5* 8.4*  MG  --   --   --   --  2.3  --   --    < > = values in this interval not displayed.   GFR: Estimated Creatinine Clearance: 57.5 mL/min (A) (by C-G formula based on SCr of 1.56 mg/dL (H)). Liver Function Tests: Recent Labs  Lab 05/21/19 0409  AST 62*  ALT 138*  ALKPHOS 149*  BILITOT 0.9  PROT 6.4*  ALBUMIN 2.2*   No results for input(s): LIPASE, AMYLASE in the last 168 hours. No results for input(s): AMMONIA in the last 168 hours. Coagulation Profile: Recent Labs  Lab 05/20/19 2340  INR 1.2   Cardiac Enzymes: No results for input(s): CKTOTAL, CKMB, CKMBINDEX, TROPONINI in the last 168 hours. BNP (last 3 results) No results for input(s): PROBNP in the last 8760 hours. HbA1C: No results for input(s): HGBA1C in the last 72 hours. CBG: No results for input(s): GLUCAP in the last 168 hours. Lipid Profile: No results for input(s): CHOL, HDL, LDLCALC, TRIG, CHOLHDL, LDLDIRECT in the last 72 hours. Thyroid Function Tests: No results for input(s): TSH, T4TOTAL, FREET4, T3FREE, THYROIDAB in the last 72 hours. Anemia Panel: Recent Labs    05/22/19 0828  FERRITIN 233   Urine analysis:    Component  Value Date/Time   COLORURINE YELLOW 01/16/2014 0912   APPEARANCEUR CLOUDY (A) 01/16/2014 0912   LABSPEC 1.024 01/16/2014 0912   PHURINE 6.0 01/16/2014 0912   GLUCOSEU NEGATIVE 01/16/2014 0912   HGBUR NEGATIVE 01/16/2014 0912   BILIRUBINUR NEGATIVE 01/16/2014 0912   KETONESUR NEGATIVE 01/16/2014 0912   PROTEINUR 30 (A) 01/16/2014 0912   UROBILINOGEN 1.0 01/16/2014 0912   NITRITE NEGATIVE 01/16/2014 0912   LEUKOCYTESUR LARGE (A) 01/16/2014 0912  Sepsis Labs: @LABRCNTIP (procalcitonin:4,lacticidven:4)  ) Recent Results (from the past 240 hour(s))  Blood culture (routine x 2)     Status: None (Preliminary result)   Collection Time: 05/20/19 10:08 PM   Specimen: BLOOD RIGHT FOREARM  Result Value Ref Range Status   Specimen Description BLOOD RIGHT FOREARM  Final   Special Requests   Final    BOTTLES DRAWN AEROBIC AND ANAEROBIC Blood Culture adequate volume   Culture   Final    NO GROWTH 4 DAYS Performed at [facility], [address], [place], [place]    Report Status PENDING  Incomplete  Expectorated sputum assessment w rflx to resp cult     Status: None   Collection Time: 05/21/19  3:06 AM   Specimen: Sputum  Result Value Ref Range Status   Specimen Description SPUTUM  Final   Special Requests NONE  Final   Sputum evaluation   Final    THIS SPECIMEN IS ACCEPTABLE FOR SPUTUM CULTURE Performed at [facility], [address], [place], [place]    Report Status 05/21/2019 FINAL  Final  Culture, respiratory     Status: None   Collection Time: 05/21/19  3:06 AM   Specimen: SPU  Result Value Ref Range Status   Specimen Description SPUTUM  Final   Special Requests NONE Reflexed from [id number]  Final   Gram Stain   Final    RARE WBC PRESENT, PREDOMINANTLY PMN FEW GRAM POSITIVE COCCI IN CLUSTERS RARE GRAM NEGATIVE RODS RARE GRAM POSITIVE RODS    Culture   Final    Consistent with normal respiratory flora. Performed at [facility],  [address] [place], [place]    Report Status 05/23/2019 FINAL  Final  Blood culture (routine x 2)     Status: None (Preliminary result)   Collection Time: 05/21/19  3:55 AM   Specimen: BLOOD  Result Value Ref Range Status   Specimen Description BLOOD LEFT ARM  Final   Special Requests   Final    BOTTLES DRAWN AEROBIC AND ANAEROBIC Blood Culture adequate volume   Culture   Final    NO GROWTH 3 DAYS Performed at [facility], [address] [place], [place]    Report Status PENDING  Incomplete  MRSA PCR Screening     Status: None   Collection Time: 05/21/19  1:45 PM   Specimen: Nasopharyngeal  Result Value Ref Range Status   MRSA by PCR NEGATIVE NEGATIVE Final    Comment:        The GeneXpert MRSA Assay (FDA approved for NASAL specimens only), is one component of a comprehensive MRSA colonization surveillance program. It is not intended to diagnose MRSA infection nor to guide or monitor treatment for MRSA infections. Performed at [facility], [address] [place], [place]          Radiology Studies: No results found.      Scheduled Meds: . aspirin EC  81 mg Oral Daily  . atorvastatin  80 mg Oral Daily  . benzonatate  200 mg Oral BID  . carvedilol  6.25 mg Oral BID  . enoxaparin (LOVENOX) injection  40 mg Subcutaneous Daily  . furosemide  40 mg Oral Daily  . sertraline  50 mg Oral Daily  . sodium chloride flush  3 mL Intravenous Once   Continuous Infusions: . azithromycin Stopped (05/24/19 0700)  . cefTRIAXone (ROCEPHIN)  IV Stopped (  05/24/19 0700)     LOS: 4 days   Time spent:  Zannie Cove, MD [facility]  05/24/2019, 12:09 PM

## 2019-05-24 NOTE — Plan of Care (Signed)
?  Problem: Clinical Measurements: ?Goal: Ability to maintain a body temperature in the normal range will improve ?Outcome: Progressing ?  ?Problem: Respiratory: ?Goal: Ability to maintain adequate ventilation will improve ?Outcome: Progressing ?  ?

## 2019-05-25 ENCOUNTER — Inpatient Hospital Stay (HOSPITAL_COMMUNITY): Payer: Medicaid Other

## 2019-05-25 LAB — CULTURE, BLOOD (ROUTINE X 2)
Culture: NO GROWTH
Special Requests: ADEQUATE

## 2019-05-25 LAB — BASIC METABOLIC PANEL
Anion gap: 8 (ref 5–15)
BUN: 8 mg/dL (ref 6–20)
CO2: 27 mmol/L (ref 22–32)
Calcium: 8.4 mg/dL — ABNORMAL LOW (ref 8.9–10.3)
Chloride: 104 mmol/L (ref 98–111)
Creatinine, Ser: 1.35 mg/dL — ABNORMAL HIGH (ref 0.44–1.00)
GFR calc Af Amer: 56 mL/min — ABNORMAL LOW (ref >60)
GFR calc non Af Amer: 49 mL/min — ABNORMAL LOW (ref >60)
Glucose, Bld: 104 mg/dL — ABNORMAL HIGH (ref 70–99)
Potassium: 3.8 mmol/L (ref 3.5–5.1)
Sodium: 139 mmol/L (ref 135–145)

## 2019-05-25 LAB — CBC
HCT: 34 % — ABNORMAL LOW (ref 36.0–46.0)
Hemoglobin: 10.8 g/dL — ABNORMAL LOW (ref 12.0–15.0)
MCH: 28.7 pg (ref 26.0–34.0)
MCHC: 31.8 g/dL (ref 30.0–36.0)
MCV: 90.4 fL (ref 80.0–100.0)
Platelets: 304 10*3/uL (ref 150–400)
RBC: 3.76 MIL/uL — ABNORMAL LOW (ref 3.87–5.11)
RDW: 14.2 % (ref 11.5–15.5)
WBC: 7.1 10*3/uL (ref 4.0–10.5)
nRBC: 0 % (ref 0.0–0.2)

## 2019-05-25 LAB — PROCALCITONIN: Procalcitonin: <0.1 ng/mL

## 2019-05-25 LAB — C-REACTIVE PROTEIN: CRP: 7.3 mg/dL — ABNORMAL HIGH (ref <1.0)

## 2019-05-25 MED ORDER — FUROSEMIDE 10 MG/ML IJ SOLN
20.0000 mg | Freq: Every day | INTRAMUSCULAR | Status: DC
  Administered 2019-05-25 – 2019-05-29 (×5): 20 mg via INTRAVENOUS
  Filled 2019-05-25 (×5): qty 2

## 2019-05-25 MED ORDER — ENOXAPARIN SODIUM 60 MG/0.6ML ~~LOC~~ SOLN
50.0000 mg | SUBCUTANEOUS | Status: DC
  Administered 2019-05-26 – 2019-05-30 (×5): 50 mg via SUBCUTANEOUS
  Filled 2019-05-25 (×5): qty 0.6

## 2019-05-25 NOTE — Progress Notes (Signed)
Pt resting at this time.

## 2019-05-25 NOTE — Progress Notes (Signed)
Pt sat up in chair-had a coughing spell-O2 sats decreased to 77%. Increased to 6L/gave prn tussionex. Working to [employer]. MD made aware. R.T. requested.

## 2019-05-25 NOTE — Progress Notes (Signed)
PROGRESS NOTE    Maria Bradford  RXV:[id number] DOB: August 05, 1980 DOA: 05/20/2019 PCP: Burnis Medin, PA-C  Brief Narrative:39 year old female with past medical history of coronary artery disease (inferior wall STEMI 05/2016, S/P angioplasty and stenting of RCA), VFib (post-STEMI complication, S/P Cardioversion x 3), hypertension, hyperlipidemia, obesity and recent diagnosis with COVID-19 on 4/19 requiring hospitalization at [facility] and discharged on 5/3 on 2 L of oxygen presented to the emergency room on 5/11 with progressive shortness of breath, which is slowly progressive over the week, reports ongoing cough intermittently productive of whitish to clear sputum, also complains of chest pain on the sides worsening with cough and inspiration, denies any fevers or chills, denies orthopnea PND. Work-up in the ED noted increasing oxygen requirement, CTA chest was negative for PE showed bilateral multifocal pneumonia. -She was started on broad-spectrum antibiotics to cover for secondary bacterial pneumonia -Pulm following  Assessment & Plan:     Acute respiratory failure with hypoxia (HCC) -Recent Covid pneumonia, diagnosed on 4/19, hospitalized from 4/26 through 5/3, treated with IV remdesivir for 5 days, 8 days of Decadron and subsequently discharged home on oral Decadron to complete 10-day course as well as 2 L of O2, readmitted 5/11 with dyspnea and hypoxia -CTA chest negative for PE, notes multifocal pneumonia consistent with Covid, she completed her Covid isolation on 5/10, completed 10day course of Decadron and 5 days of remdesivir -Started on IV ceftriaxone and azithromycin on admission for possible secondary bacterial pneumonia, day 5 of antibiotics now -Clinically is not fluid overloaded, continue oral Lasix to keep euvolemic status -Very minimal clinical improvement -Appreciate pulmonary input -CRP is trending down, procalcitonin remains less than 0.1 -Out of bed to chair, incentive  spirometry, wean O2 as tolerated  History of CAD -History history of inferior STEMI in 2018, treated with 2 stents -Stable, no evidence of ACS, -Continue aspirin, Coreg, statin  Hypertension -Stable, continue Coreg  Obesity -BMI 36.6 -Lifestyle modification recommended  DVT prophylaxis: Lovenox Code Status: Full code Family Communication: Discussed with patient in detail, no family at bedside Disposition Plan:  Status is: Inpatient  Remains inpatient appropriate because: High oxygen requirement, significant dyspnea with any activity Dispo: The patient is from: Home              Anticipated d/c is to: Home              Anticipated d/c date is: ?3 days              Patient currently is not medically stable to d/c.     Procedures:   Antimicrobials:    Subjective: -Minimal improvement, reports that her cough is a little better, still short of breath with any activity  Objective: Vitals:   05/25/19 0821 05/25/19 0927 05/25/19 0951 05/25/19 0952  BP: (!) 135/113     Pulse: (!) 112 (!) 110 93   Resp: (!) 42 (!) 32 (!) 23   Temp:      TempSrc:      SpO2: (!) 73% (!) 77% 97% 92%  Weight:      Height:        Intake/Output Summary (Last 24 hours) at 05/25/2019 1031 Last data filed at 05/24/2019 1847 Gross per 24 hour  Intake -  Output 2550 ml  Net -2550 ml   Filed Weights   05/20/19 1625  Weight: 103.1 kg    Examination:  Gen: Obese pleasant female sitting up in bed awake, Alert, Oriented X 3, dyspneic with minimal  activity HEENT: Neck obese unable to assess JVD Lungs: Poor air movement bilaterally, bronchial breath sounds CVS: S1-S2, regular rate rhythm Abd: soft, Non tender, non distended, BS present Extremities: No edema Skin: No rashes on exposed skin Psychiatry: Appropriate mood and affect    Data Reviewed:   CBC: Recent Labs  Lab 05/20/19 1155 05/20/19 2204 05/21/19 0231 05/21/19 0409 05/22/19 0224 05/23/19 0923 05/25/19 0302  WBC 13.6*   --   --  13.2* 18.1* 7.3 7.1  NEUTROABS  --   --   --  11.5*  --   --   --   HGB 12.3   < > 12.2 11.8* 10.9* 11.3* 10.8*  HCT 39.5   < > 36.0 36.8 34.0* 35.5* 34.0*  MCV 92.7  --   --  90.4 89.5 91.7 90.4  PLT 374  --   --  377 346 315 304   < > = values in this interval not displayed.   Basic Metabolic Panel: Recent Labs  Lab 05/20/19 1155 05/20/19 2204 05/21/19 0231 05/21/19 0409 05/22/19 0224 05/23/19 0923 05/25/19 0302  NA 141   < > 137 138 139 139 139  K 4.5   < > 4.0 4.2 3.9 4.2 3.8  CL 105  --   --  105 105 102 104  CO2 24  --   --  21* 24 26 27   GLUCOSE 144*  --   --  182* 122* 105* 104*  BUN 6  --   --  13 14 11 8   CREATININE 1.36*  --   --  1.52* 1.52* 1.56* 1.35*  CALCIUM 8.7*  --   --  8.7* 8.5* 8.4* 8.4*  MG  --   --   --  2.3  --   --   --    < > = values in this interval not displayed.   GFR: Estimated Creatinine Clearance: 66.5 mL/min (A) (by C-G formula based on SCr of 1.35 mg/dL (H)). Liver Function Tests: Recent Labs  Lab 05/21/19 0409  AST 62*  ALT 138*  ALKPHOS 149*  BILITOT 0.9  PROT 6.4*  ALBUMIN 2.2*   No results for input(s): LIPASE, AMYLASE in the last 168 hours. No results for input(s): AMMONIA in the last 168 hours. Coagulation Profile: Recent Labs  Lab 05/20/19 2340  INR 1.2   Cardiac Enzymes: No results for input(s): CKTOTAL, CKMB, CKMBINDEX, TROPONINI in the last 168 hours. BNP (last 3 results) No results for input(s): PROBNP in the last 8760 hours. HbA1C: No results for input(s): HGBA1C in the last 72 hours. CBG: No results for input(s): GLUCAP in the last 168 hours. Lipid Profile: No results for input(s): CHOL, HDL, LDLCALC, TRIG, CHOLHDL, LDLDIRECT in the last 72 hours. Thyroid Function Tests: No results for input(s): TSH, T4TOTAL, FREET4, T3FREE, THYROIDAB in the last 72 hours. Anemia Panel: No results for input(s): VITAMINB12, FOLATE, FERRITIN, TIBC, IRON, RETICCTPCT in the last 72 hours. Urine analysis:     Component Value Date/Time   COLORURINE YELLOW 01/16/2014 0912   APPEARANCEUR CLOUDY (A) 01/16/2014 0912   LABSPEC 1.024 01/16/2014 0912   PHURINE 6.0 01/16/2014 0912   GLUCOSEU NEGATIVE 01/16/2014 0912   HGBUR NEGATIVE 01/16/2014 0912   BILIRUBINUR NEGATIVE 01/16/2014 0912   KETONESUR NEGATIVE 01/16/2014 0912   PROTEINUR 30 (A) 01/16/2014 0912   UROBILINOGEN 1.0 01/16/2014 0912   NITRITE NEGATIVE 01/16/2014 0912   LEUKOCYTESUR LARGE (A) 01/16/2014 0912   Sepsis Labs: @LABRCNTIP (procalcitonin:4,lacticidven:4)  ) Recent Results (from  the past 240 hour(s))  Blood culture (routine x 2)     Status: None   Collection Time: 05/20/19 10:08 PM   Specimen: BLOOD RIGHT FOREARM  Result Value Ref Range Status   Specimen Description BLOOD RIGHT FOREARM  Final   Special Requests   Final    BOTTLES DRAWN AEROBIC AND ANAEROBIC Blood Culture adequate volume   Culture   Final    NO GROWTH 5 DAYS Performed at [facility], [address] [place], [place] [address]    Report Status 05/25/2019 FINAL  Final  Expectorated sputum assessment w rflx to resp cult     Status: None   Collection Time: 05/21/19  3:06 AM   Specimen: Sputum  Result Value Ref Range Status   Specimen Description SPUTUM  Final   Special Requests NONE  Final   Sputum evaluation   Final    THIS SPECIMEN IS ACCEPTABLE FOR SPUTUM CULTURE Performed at [facility], [address], [place], [place] [address]    Report Status 05/21/2019 FINAL  Final  Culture, respiratory     Status: None   Collection Time: 05/21/19  3:06 AM   Specimen: SPU  Result Value Ref Range Status   Specimen Description SPUTUM  Final   Special Requests NONE Reflexed from [id number]  Final   Gram Stain   Final    RARE WBC PRESENT, PREDOMINANTLY PMN FEW GRAM POSITIVE COCCI IN CLUSTERS RARE GRAM NEGATIVE RODS RARE GRAM POSITIVE RODS    Culture   Final    Consistent with normal respiratory flora. Performed at [facility], [address] [place], [place] [address]    Report Status 05/23/2019 FINAL  Final  Blood culture (routine x 2)     Status: None (Preliminary result)   Collection Time: 05/21/19  3:55 AM   Specimen: BLOOD  Result Value Ref Range Status   Specimen Description BLOOD LEFT ARM  Final   Special Requests   Final    BOTTLES DRAWN AEROBIC AND ANAEROBIC Blood Culture adequate volume   Culture   Final    NO GROWTH 4 DAYS Performed at [facility], [address], [place], [place] [address]    Report Status PENDING  Incomplete  MRSA PCR Screening     Status: None   Collection Time: 05/21/19  1:45 PM   Specimen: Nasopharyngeal  Result Value Ref Range Status   MRSA by PCR NEGATIVE NEGATIVE Final    Comment:        The GeneXpert MRSA Assay (FDA approved for NASAL specimens only), is one component of a comprehensive MRSA colonization surveillance program. It is not intended to diagnose MRSA infection nor to guide or monitor treatment for MRSA infections. Performed at [facility], [address], [place], [place] [address]          Radiology Studies: No results found.      Scheduled Meds: . aspirin EC  81 mg Oral Daily  . atorvastatin  80 mg Oral Daily  . benzonatate  200 mg Oral BID  . carvedilol  6.25 mg Oral BID  . enoxaparin (LOVENOX) injection  40 mg Subcutaneous Q12H  . furosemide  40 mg Oral Daily  . sertraline  50 mg Oral Daily  . sodium chloride flush  3 mL Intravenous Once   Continuous Infusions: . azithromycin Stopped (05/25/19 0700)  . cefTRIAXone (ROCEPHIN)  IV Stopped (05/25/19 0723)     LOS: 5 days  Time spent: 81min  Domenic Polite, MD [facility]  05/25/2019, 10:31 AM

## 2019-05-26 LAB — CBC
HCT: 34.9 % — ABNORMAL LOW (ref 36.0–46.0)
Hemoglobin: 11.2 g/dL — ABNORMAL LOW (ref 12.0–15.0)
MCH: 28.8 pg (ref 26.0–34.0)
MCHC: 32.1 g/dL (ref 30.0–36.0)
MCV: 89.7 fL (ref 80.0–100.0)
Platelets: 318 10*3/uL (ref 150–400)
RBC: 3.89 MIL/uL (ref 3.87–5.11)
RDW: 13.7 % (ref 11.5–15.5)
WBC: 7.3 10*3/uL (ref 4.0–10.5)
nRBC: 0 % (ref 0.0–0.2)

## 2019-05-26 LAB — BASIC METABOLIC PANEL
Anion gap: 11 (ref 5–15)
BUN: 7 mg/dL (ref 6–20)
CO2: 27 mmol/L (ref 22–32)
Calcium: 8.4 mg/dL — ABNORMAL LOW (ref 8.9–10.3)
Chloride: 100 mmol/L (ref 98–111)
Creatinine, Ser: 1.4 mg/dL — ABNORMAL HIGH (ref 0.44–1.00)
GFR calc Af Amer: 54 mL/min — ABNORMAL LOW (ref >60)
GFR calc non Af Amer: 47 mL/min — ABNORMAL LOW (ref >60)
Glucose, Bld: 93 mg/dL (ref 70–99)
Potassium: 3.6 mmol/L (ref 3.5–5.1)
Sodium: 138 mmol/L (ref 135–145)

## 2019-05-26 LAB — CULTURE, BLOOD (ROUTINE X 2)
Culture: NO GROWTH
Special Requests: ADEQUATE

## 2019-05-26 LAB — C-REACTIVE PROTEIN: CRP: 6.9 mg/dL — ABNORMAL HIGH (ref <1.0)

## 2019-05-26 NOTE — Progress Notes (Signed)
Physical Therapy Treatment Patient Details Name: Maria Bradford MRN: [id number] DOB: [birth year] Today's Date: 05/26/2019    History of Present Illness Pt is 39 yo female with PMH including CAD with STEMI in 2018, s/p angioplasty and stenting, HTN, hyperlipidemia, and obesity.  Pt with COVID on 4/19 requiring hospitalization and d/c on 5/3.  Pt returned to hospital on 5/11 with progressive SHOB.  CTA - for PE but with multifocal PNE.    PT Comments    Pt was able to increase gait distance today.  She was tolerating activity on 5 L O2 well until she began coughing and then required increased recovery with O2 increased and vitals monitored (see below).  Will cont to benefit from PT to advance.  Additionally, recommended frequent shorter bouts of ambulation with nursing.   Follow Up Recommendations  Home health PT(pending progress)     Equipment Recommendations  None recommended by PT    Recommendations for Other Services       Precautions / Restrictions Precautions Precautions: None    Mobility  Bed Mobility               General bed mobility comments: in chair at arrival  Transfers Overall transfer level: Needs assistance Equipment used: None Transfers: Sit to/from Stand Sit to Stand: Supervision         General transfer comment: supervision for safety  Ambulation/Gait Ambulation/Gait assistance: Min guard Gait Distance (Feet): 180 Feet Assistive device: None Gait Pattern/deviations: Step-through pattern Gait velocity: decreased   General Gait Details: Pt with 3 standing rest breaks due to coughing in last 50' of the walk.  Pt was on 5 L HFNC at rest with sats 95%.  Sats maintained 95% for 100' of ambulation.  Pt then began having coughing episode and required 3 standing rest breaks with sats down to 77% when returned to sitting. Pt required O2 to be increased to 8 then 10 LPM for recovery over then next 5 minutes.  Once recovered to 90% gradually decreased to 5 L  over 5 minutes. Prior to coughing episode pt with DOE of 1/4 and reported doing well.   Stairs             Wheelchair Mobility    Modified Rankin (Stroke Patients Only)       Balance Overall balance assessment: Mild deficits observed, not formally tested                                          Cognition Arousal/Alertness: Awake/alert Behavior During Therapy: WFL for tasks assessed/performed Overall Cognitive Status: Within Functional Limits for tasks assessed                                        Exercises      General Comments General comments (skin integrity, edema, etc.): Pt asking for techniques to assist with walking in managing coughing. Pt reports cough felt like dry tickle in throat that made her hot.  Discussed having water as needed or possible use of hard candy to keep mouth/throat moist while walking.  Also, discussed frequent short bouts of ambulation so that she could rest prior to coughing starting.      Pertinent Vitals/Pain Pain Assessment: No/denies pain    Home Living  Prior Function            PT Goals (current goals can now be found in the care plan section) Acute Rehab PT Goals Patient Stated Goal: breathe better PT Goal Formulation: With patient Time For Goal Achievement: 06/06/19 Potential to Achieve Goals: Good Progress towards PT goals: Progressing toward goals    Frequency    Min 3X/week      PT Plan Current plan remains appropriate    Co-evaluation              AM-PAC PT "6 Clicks" Mobility   Outcome Measure  Help needed turning from your back to your side while in a flat bed without using bedrails?: None Help needed moving from lying on your back to sitting on the side of a flat bed without using bedrails?: None Help needed moving to and from a bed to a chair (including a wheelchair)?: None Help needed standing up from a chair using your arms  (e.g., wheelchair or bedside chair)?: None Help needed to walk in hospital room?: None Help needed climbing 3-5 steps with a railing? : A Little 6 Click Score: 23    End of Session Equipment Utilized During Treatment: Oxygen Activity Tolerance: Patient limited by fatigue Patient left: in chair;with call bell/phone within reach Nurse Communication: Mobility status PT Visit Diagnosis: Other abnormalities of gait and mobility (R26.89);Muscle weakness (generalized) (M62.81)     Time: 0034-9179 PT Time Calculation (min) (ACUTE ONLY): 30 min  Charges:  $Gait Training: 8-22 mins $Therapeutic Activity: 8-22 mins                     Royetta Asal, PT Acute Rehab Services Pager [phone] [facility] [phone] [facility] [phone]    Rayetta Humphrey 05/26/2019, 5:17 PM

## 2019-05-26 NOTE — Progress Notes (Addendum)
PROGRESS NOTE    Maria Bradford  KKX:[id number] DOB: 01-08-1981 DOA: 05/20/2019 PCP: Burnis Medin, PA-C  Brief Narrative:38 year old female with past medical history of coronary artery disease (inferior wall STEMI 05/2016, S/P angioplasty and stenting of RCA), hypertension, hyperlipidemia, obesity and recent diagnosis with COVID-19 on 4/19 requiring hospitalization at [facility] and discharged on 5/3 on 2 L of oxygen presented to the emergency room on 5/11 with progressive shortness of breath, which is slowly progressive over the week, reports ongoing cough intermittently productive of whitish to clear sputum. Work-up in the ED noted increasing oxygen requirement, CTA chest was negative for PE showed bilateral multifocal pneumonia. -She was started on broad-spectrum antibiotics to cover for secondary bacterial pneumonia -Pulm following, continues to have considerable oxygen requirement and minimal improvement  Assessment & Plan:     Acute respiratory failure with hypoxia (HCC) -Recent Covid pneumonia, diagnosed on 4/19, hospitalized from 4/26 through 5/3, treated with IV remdesivir for 5 days, 8 days of Decadron and subsequently discharged home on oral Decadron to complete 10-day course as well as 2 L of O2, readmitted 5/11 with dyspnea and hypoxia -CTA chest negative for PE, notes multifocal pneumonia consistent with Covid, she completed her Covid isolation on 5/10, completed 10day course of Decadron and 5 days of remdesivir -Started on IV ceftriaxone and azithromycin on admission for possible secondary bacterial pneumonia, day 6 of antibiotics now -Initially was not fluid overloaded, repeat x-ray yesterday noted small pleural effusions, continue low-dose IV Lasix for 2 days -Very minimal clinical improvement, continues to be dyspneic just talking and turning in bed, oxygen requirement around 6 L high flow-Appreciate pulmonary input, did not feel she needed continued steroid  therapy -Fortunately CRP is trending down, procalcitonin repeat also is less than 0.1 -Out of bed to chair, incentive spirometry, wean O2 as tolerated  History of CAD -History history of inferior STEMI in 2018, treated with 2 stents -Stable, no evidence of ACS, -Continue aspirin, Coreg, statin  Hypertension -Stable, continue Coreg  Obesity -BMI 36.6 -Lifestyle modification recommended  DVT prophylaxis: Lovenox Code Status: Full code Family Communication: Discussed with patient in detail, no family at bedside Disposition Plan:  Status is: Inpatient  Remains inpatient appropriate because: High oxygen requirement, significant dyspnea with any activity Dispo: The patient is from: Home              Anticipated d/c is to: Home              Anticipated d/c date is: ?3 days              Patient currently is not medically stable to d/c.     Procedures:   Antimicrobials:    Subjective: -Very minimal improvement, continues to be dyspneic with slightest activity even in bed, set up in the recliner for few hours yesterday, slight improvement in cough  Objective: Vitals:   05/26/19 0800 05/26/19 0900 05/26/19 0903 05/26/19 1000  BP: (!) 144/94     Pulse: 90 91 95 87  Resp: (!) 21 15 (!) 22 (!) 23  Temp:      TempSrc:      SpO2: 96% 95% 90% 96%  Weight:      Height:        Intake/Output Summary (Last 24 hours) at 05/26/2019 1120 Last data filed at 05/26/2019 0745 Gross per 24 hour  Intake 710 ml  Output 2300 ml  Net -1590 ml   Filed Weights   05/20/19 1625  Weight: 103.1 kg  Examination:  Gen: Obese pleasant female sitting up in bed, AAOx3, dyspneic with minimal activity including talking HEENT neck obese unable to assess JVD Lungs with poor air movement bilaterally and bronchial breath sounds CVS S1-S2 regular rate rhythm Abdomen is soft nontender with positive bowel sounds no organomegaly Extremities with no edema Skin: No rashes on exposed skin Psychiatry:  Appropriate mood and affect    Data Reviewed:   CBC: Recent Labs  Lab 05/21/19 0409 05/22/19 0224 05/23/19 0923 05/25/19 0302 05/26/19 0242  WBC 13.2* 18.1* 7.3 7.1 7.3  NEUTROABS 11.5*  --   --   --   --   HGB 11.8* 10.9* 11.3* 10.8* 11.2*  HCT 36.8 34.0* 35.5* 34.0* 34.9*  MCV 90.4 89.5 91.7 90.4 89.7  PLT 377 346 315 304 318   Basic Metabolic Panel: Recent Labs  Lab 05/21/19 0409 05/22/19 0224 05/23/19 0923 05/25/19 0302 05/26/19 0242  NA 138 139 139 139 138  K 4.2 3.9 4.2 3.8 3.6  CL 105 105 102 104 100  CO2 21* 24 26 27 27   GLUCOSE 182* 122* 105* 104* 93  BUN 13 14 11 8 7   CREATININE 1.52* 1.52* 1.56* 1.35* 1.40*  CALCIUM 8.7* 8.5* 8.4* 8.4* 8.4*  MG 2.3  --   --   --   --    GFR: Estimated Creatinine Clearance: 64.1 mL/min (A) (by C-G formula based on SCr of 1.4 mg/dL (H)). Liver Function Tests: Recent Labs  Lab 05/21/19 0409  AST 62*  ALT 138*  ALKPHOS 149*  BILITOT 0.9  PROT 6.4*  ALBUMIN 2.2*   No results for input(s): LIPASE, AMYLASE in the last 168 hours. No results for input(s): AMMONIA in the last 168 hours. Coagulation Profile: Recent Labs  Lab 05/20/19 2340  INR 1.2   Cardiac Enzymes: No results for input(s): CKTOTAL, CKMB, CKMBINDEX, TROPONINI in the last 168 hours. BNP (last 3 results) No results for input(s): PROBNP in the last 8760 hours. HbA1C: No results for input(s): HGBA1C in the last 72 hours. CBG: No results for input(s): GLUCAP in the last 168 hours. Lipid Profile: No results for input(s): CHOL, HDL, LDLCALC, TRIG, CHOLHDL, LDLDIRECT in the last 72 hours. Thyroid Function Tests: No results for input(s): TSH, T4TOTAL, FREET4, T3FREE, THYROIDAB in the last 72 hours. Anemia Panel: No results for input(s): VITAMINB12, FOLATE, FERRITIN, TIBC, IRON, RETICCTPCT in the last 72 hours. Urine analysis:    Component Value Date/Time   COLORURINE YELLOW 01/16/2014 0912   APPEARANCEUR CLOUDY (A) 01/16/2014 0912   LABSPEC 1.024  01/16/2014 0912   PHURINE 6.0 01/16/2014 0912   GLUCOSEU NEGATIVE 01/16/2014 0912   HGBUR NEGATIVE 01/16/2014 0912   BILIRUBINUR NEGATIVE 01/16/2014 0912   KETONESUR NEGATIVE 01/16/2014 0912   PROTEINUR 30 (A) 01/16/2014 0912   UROBILINOGEN 1.0 01/16/2014 0912   NITRITE NEGATIVE 01/16/2014 0912   LEUKOCYTESUR LARGE (A) 01/16/2014 0912   Sepsis Labs: @LABRCNTIP (procalcitonin:4,lacticidven:4)  ) Recent Results (from the past 240 hour(s))  Blood culture (routine x 2)     Status: None   Collection Time: 05/20/19 10:08 PM   Specimen: BLOOD RIGHT FOREARM  Result Value Ref Range Status   Specimen Description BLOOD RIGHT FOREARM  Final   Special Requests   Final    BOTTLES DRAWN AEROBIC AND ANAEROBIC Blood Culture adequate volume   Culture   Final    NO GROWTH 5 DAYS Performed at [facility], [address] [place], [facility] [address]    Report Status 05/25/2019  FINAL  Final  Expectorated sputum assessment w rflx to resp cult     Status: None   Collection Time: 05/21/19  3:06 AM   Specimen: Sputum  Result Value Ref Range Status   Specimen Description SPUTUM  Final   Special Requests NONE  Final   Sputum evaluation   Final    THIS SPECIMEN IS ACCEPTABLE FOR SPUTUM CULTURE Performed at [facility], [address] [place], [place] [address]    Report Status 05/21/2019 FINAL  Final  Culture, respiratory     Status: None   Collection Time: 05/21/19  3:06 AM   Specimen: SPU  Result Value Ref Range Status   Specimen Description SPUTUM  Final   Special Requests NONE Reflexed from [id number]  Final   Gram Stain   Final    RARE WBC PRESENT, PREDOMINANTLY PMN FEW GRAM POSITIVE COCCI IN CLUSTERS RARE GRAM NEGATIVE RODS RARE GRAM POSITIVE RODS    Culture   Final    Consistent with normal respiratory flora. Performed at [facility], [address] [place], [place] [address]    Report Status 05/23/2019 FINAL  Final  Blood culture (routine x 2)     Status: None    Collection Time: 05/21/19  3:55 AM   Specimen: BLOOD  Result Value Ref Range Status   Specimen Description BLOOD LEFT ARM  Final   Special Requests   Final    BOTTLES DRAWN AEROBIC AND ANAEROBIC Blood Culture adequate volume   Culture   Final    NO GROWTH 5 DAYS Performed at [facility], [address], [place], [place] [address]    Report Status 05/26/2019 FINAL  Final  MRSA PCR Screening     Status: None   Collection Time: 05/21/19  1:45 PM   Specimen: Nasopharyngeal  Result Value Ref Range Status   MRSA by PCR NEGATIVE NEGATIVE Final    Comment:        The GeneXpert MRSA Assay (FDA approved for NASAL specimens only), is one component of a comprehensive MRSA colonization surveillance program. It is not intended to diagnose MRSA infection nor to guide or monitor treatment for MRSA infections. Performed at [facility], [address], [place], [place] [address]          Radiology Studies: DG Chest 2 View  Result Date: 05/25/2019 CLINICAL DATA:  COVID positive hypoxia, shortness of breath. EXAM: CHEST - 2 VIEW COMPARISON:  05/21/2019 FINDINGS: Lung volumes remain low. Cardiomediastinal contours are partially obscured due to diffuse interstitial and airspace disease in the RIGHT chest which has worsened since the previous exam. Opacities in the LEFT chest appear slightly more dense as well particularly in the retrocardiac region. Probable RIGHT effusion and small LEFT effusion. Visualized skeletal structures are unremarkable. IMPRESSION: 1. More confluent appearance of disease in the RIGHT chest diffusely and in the LEFT chest particularly at LEFT lower lobe. 2. Small effusions bilaterally. Electronically Signed   By: Donzetta Kohut M.D.   On: 05/25/2019 11:46        Scheduled Meds: . aspirin EC  81 mg Oral Daily  . atorvastatin  80 mg Oral Daily  . benzonatate  200 mg Oral BID  . carvedilol  6.25 mg Oral BID  . enoxaparin (LOVENOX) injection  50 mg  Subcutaneous Q24H  . furosemide  20 mg Intravenous Daily  . sertraline  50 mg Oral Daily  . sodium chloride flush  3 mL Intravenous Once  Continuous Infusions: . azithromycin 500 mg (05/25/19 2105)  . cefTRIAXone (ROCEPHIN)  IV 2 g (05/25/19 2300)     LOS: 6 days   Time spent: 46min  Domenic Polite, MD [facility]  05/26/2019, 11:20 AM

## 2019-05-27 LAB — BASIC METABOLIC PANEL
Anion gap: 11 (ref 5–15)
BUN: <5 mg/dL — ABNORMAL LOW (ref 6–20)
CO2: 27 mmol/L (ref 22–32)
Calcium: 8.3 mg/dL — ABNORMAL LOW (ref 8.9–10.3)
Chloride: 102 mmol/L (ref 98–111)
Creatinine, Ser: 1.34 mg/dL — ABNORMAL HIGH (ref 0.44–1.00)
GFR calc Af Amer: 57 mL/min — ABNORMAL LOW (ref >60)
GFR calc non Af Amer: 49 mL/min — ABNORMAL LOW (ref >60)
Glucose, Bld: 103 mg/dL — ABNORMAL HIGH (ref 70–99)
Potassium: 3.3 mmol/L — ABNORMAL LOW (ref 3.5–5.1)
Sodium: 140 mmol/L (ref 135–145)

## 2019-05-27 LAB — CBC
HCT: 34 % — ABNORMAL LOW (ref 36.0–46.0)
Hemoglobin: 10.9 g/dL — ABNORMAL LOW (ref 12.0–15.0)
MCH: 28.6 pg (ref 26.0–34.0)
MCHC: 32.1 g/dL (ref 30.0–36.0)
MCV: 89.2 fL (ref 80.0–100.0)
Platelets: 335 10*3/uL (ref 150–400)
RBC: 3.81 MIL/uL — ABNORMAL LOW (ref 3.87–5.11)
RDW: 13.5 % (ref 11.5–15.5)
WBC: 7.3 10*3/uL (ref 4.0–10.5)
nRBC: 0 % (ref 0.0–0.2)

## 2019-05-27 LAB — C-REACTIVE PROTEIN: CRP: 4.9 mg/dL — ABNORMAL HIGH (ref <1.0)

## 2019-05-27 MED ORDER — POTASSIUM CHLORIDE CRYS ER 20 MEQ PO TBCR
40.0000 meq | EXTENDED_RELEASE_TABLET | Freq: Two times a day (BID) | ORAL | Status: AC
  Administered 2019-05-27 (×2): 40 meq via ORAL
  Filled 2019-05-27 (×2): qty 2

## 2019-05-27 NOTE — Plan of Care (Signed)
  Problem: Activity: Goal: Ability to tolerate increased activity will improve Outcome: Progressing   Problem: Clinical Measurements: Goal: Ability to maintain a body temperature in the normal range will improve Outcome: Progressing   Problem: Respiratory: Goal: Ability to maintain adequate ventilation will improve Outcome: Progressing Goal: Ability to maintain a clear airway will improve Outcome: Progressing   

## 2019-05-27 NOTE — Progress Notes (Signed)
PROGRESS NOTE    Maria Bradford  [id number] DOB: Dec 17, 1980 DOA: 05/20/2019 PCP: Elisabeth Cara, PA-C  Brief Narrative:39 year old female with past medical history of coronary artery disease (inferior wall STEMI 05/2016, S/P angioplasty and stenting of RCA), hypertension, hyperlipidemia, obesity and recent diagnosis with COVID-19 on 4/19 requiring hospitalization at [facility] and discharged on 5/3 on 2 L of oxygen presented to the emergency room on 5/11 with progressive shortness of breath, slowly progressive over the week, reports ongoing cough intermittently productive of whitish to clear sputum. Work-up in the ED noted increasing oxygen requirement, CTA chest was negative for PE showed bilateral multifocal pneumonia. -She was started on broad-spectrum antibiotics to cover for secondary bacterial pneumonia -Pulm following, continues to have considerable oxygen requirement and very slow improvement  Assessment & Plan:     Acute respiratory failure with hypoxia (HCC) -Recent Covid pneumonia, diagnosed on 4/19, hospitalized from 4/26 through 5/3, treated with IV remdesivir for 5 days, 8 days of Decadron and subsequently discharged home on oral Decadron to complete 10-day course as well as 2 L of O2, readmitted 5/11 with dyspnea and hypoxia -CTA chest negative for PE, notes multifocal pneumonia consistent with Covid, she completed her Covid isolation on 5/10, completed 10day course of Decadron and 5 days of remdesivir -Started on IV ceftriaxone and azithromycin on admission for possible secondary bacterial pneumonia, day 7 of antibiotics now -Initially was not fluid overloaded, repeat x-ray 5/16 noted small pleural effusions, continue low-dose IV Lasix today -Very slow clinical improvement, continues to be dyspneic just talking and turning in bed, oxygen requirement a little better and down to 4 L this am, 6L yesterday -Appreciate pulmonary input, did not feel she needed continued steroid  therapy -Fortunately CRP is trending down, procalcitonin repeat also is less than 0.1 -Out of bed to chair, incentive spirometry, wean O2 as tolerated -stop Abx after todays dose -PT following -Home in 2-3days if O2 needs stable and improving  History of CAD -History history of inferior STEMI in 2018, treated with 2 stents -Stable, no evidence of ACS, -Continue aspirin, Coreg, statin  Hypertension -Stable, continue Coreg  Obesity -BMI 36.6 -Lifestyle modification recommended  DVT prophylaxis: Lovenox Code Status: Full code Family Communication: Discussed with patient in detail, no family at bedside Disposition Plan:  Status is: Inpatient  Remains inpatient appropriate because: High oxygen requirement, significant dyspnea with any activity Dispo: The patient is from: Home              Anticipated d/c is to: Home              Anticipated d/c date is: ?2-3 days              Patient currently is not medically stable to d/c.     Procedures:   Antimicrobials:    Subjective: -Starting to feel little better, ambulated a bit with PT yesterday, had to stop due to dyspnea and hypoxia -Still gets out of breath with minimal activity however overall improving  Objective: Vitals:   05/27/19 0800 05/27/19 0845 05/27/19 0900 05/27/19 1000  BP:      Pulse: 76 96 95 90  Resp: (!) 23 (!) 26 (!) 29 16  Temp:      TempSrc:      SpO2: 98% (!) 87% 92% 98%  Weight:      Height:        Intake/Output Summary (Last 24 hours) at 05/27/2019 1106 Last data filed at 05/27/2019 0845 Gross per 24  hour  Intake 480 ml  Output --  Net 480 ml   Filed Weights   05/20/19 1625  Weight: 103.1 kg    Examination:  Gen: Obese pleasant female sitting up in bed, AAO x3, no distress this morning HEENT neck obese unable to assess JVD Lungs with improving air movement, bronchial breath sounds bilaterally CVS S1-S2 regular rate rhythm Abdomen is soft nontender with positive bowel sounds no  organomegaly Extremities with no edema  Skin: No rashes on exposed skin Psychiatry: Appropriate mood and affect    Data Reviewed:   CBC: Recent Labs  Lab 05/21/19 0409 05/21/19 0409 05/22/19 0224 05/23/19 0923 05/25/19 0302 05/26/19 0242 05/27/19 0228  WBC 13.2*   < > 18.1* 7.3 7.1 7.3 7.3  NEUTROABS 11.5*  --   --   --   --   --   --   HGB 11.8*   < > 10.9* 11.3* 10.8* 11.2* 10.9*  HCT 36.8   < > 34.0* 35.5* 34.0* 34.9* 34.0*  MCV 90.4   < > 89.5 91.7 90.4 89.7 89.2  PLT 377   < > 346 315 304 318 335   < > = values in this interval not displayed.   Basic Metabolic Panel: Recent Labs  Lab 05/21/19 0409 05/21/19 0409 05/22/19 0224 05/23/19 0923 05/25/19 0302 05/26/19 0242 05/27/19 0228  NA 138   < > 139 139 139 138 140  K 4.2   < > 3.9 4.2 3.8 3.6 3.3*  CL 105   < > 105 102 104 100 102  CO2 21*   < > 24 26 27 27 27   GLUCOSE 182*   < > 122* 105* 104* 93 103*  BUN 13   < > 14 11 8 7  <5*  CREATININE 1.52*   < > 1.52* 1.56* 1.35* 1.40* 1.34*  CALCIUM 8.7*   < > 8.5* 8.4* 8.4* 8.4* 8.3*  MG 2.3  --   --   --   --   --   --    < > = values in this interval not displayed.   GFR: Estimated Creatinine Clearance: 67 mL/min (A) (by C-G formula based on SCr of 1.34 mg/dL (H)). Liver Function Tests: Recent Labs  Lab 05/21/19 0409  AST 62*  ALT 138*  ALKPHOS 149*  BILITOT 0.9  PROT 6.4*  ALBUMIN 2.2*   No results for input(s): LIPASE, AMYLASE in the last 168 hours. No results for input(s): AMMONIA in the last 168 hours. Coagulation Profile: Recent Labs  Lab 05/20/19 2340  INR 1.2   Cardiac Enzymes: No results for input(s): CKTOTAL, CKMB, CKMBINDEX, TROPONINI in the last 168 hours. BNP (last 3 results) No results for input(s): PROBNP in the last 8760 hours. HbA1C: No results for input(s): HGBA1C in the last 72 hours. CBG: No results for input(s): GLUCAP in the last 168 hours. Lipid Profile: No results for input(s): CHOL, HDL, LDLCALC, TRIG, CHOLHDL,  LDLDIRECT in the last 72 hours. Thyroid Function Tests: No results for input(s): TSH, T4TOTAL, FREET4, T3FREE, THYROIDAB in the last 72 hours. Anemia Panel: No results for input(s): VITAMINB12, FOLATE, FERRITIN, TIBC, IRON, RETICCTPCT in the last 72 hours. Urine analysis:    Component Value Date/Time   COLORURINE YELLOW 01/16/2014 0912   APPEARANCEUR CLOUDY (A) 01/16/2014 0912   LABSPEC 1.024 01/16/2014 0912   PHURINE 6.0 01/16/2014 0912   GLUCOSEU NEGATIVE 01/16/2014 0912   HGBUR NEGATIVE 01/16/2014 0912   BILIRUBINUR NEGATIVE 01/16/2014 0912   KETONESUR NEGATIVE  01/16/2014 0912   PROTEINUR 30 (A) 01/16/2014 0912   UROBILINOGEN 1.0 01/16/2014 0912   NITRITE NEGATIVE 01/16/2014 0912   LEUKOCYTESUR LARGE (A) 01/16/2014 0912   Sepsis Labs: @LABRCNTIP (procalcitonin:4,lacticidven:4)  ) Recent Results (from the past 240 hour(s))  Blood culture (routine x 2)     Status: None   Collection Time: 05/20/19 10:08 PM   Specimen: BLOOD RIGHT FOREARM  Result Value Ref Range Status   Specimen Description BLOOD RIGHT FOREARM  Final   Special Requests   Final    BOTTLES DRAWN AEROBIC AND ANAEROBIC Blood Culture adequate volume   Culture   Final    NO GROWTH 5 DAYS Performed at [facility], [address], [place], [place]    Report Status 05/25/2019 FINAL  Final  Expectorated sputum assessment w rflx to resp cult     Status: None   Collection Time: 05/21/19  3:06 AM   Specimen: Sputum  Result Value Ref Range Status   Specimen Description SPUTUM  Final   Special Requests NONE  Final   Sputum evaluation   Final    THIS SPECIMEN IS ACCEPTABLE FOR SPUTUM CULTURE Performed at [facility], [address], [place], [place]    Report Status 05/21/2019 FINAL  Final  Culture, respiratory     Status: None   Collection Time: 05/21/19  3:06 AM   Specimen: SPU  Result Value Ref Range Status   Specimen Description SPUTUM  Final   Special Requests NONE Reflexed  from [id number]  Final   Gram Stain   Final    RARE WBC PRESENT, PREDOMINANTLY PMN FEW GRAM POSITIVE COCCI IN CLUSTERS RARE GRAM NEGATIVE RODS RARE GRAM POSITIVE RODS    Culture   Final    Consistent with normal respiratory flora. Performed at [facility], [address] [place], [place]    Report Status 05/23/2019 FINAL  Final  Blood culture (routine x 2)     Status: None   Collection Time: 05/21/19  3:55 AM   Specimen: BLOOD  Result Value Ref Range Status   Specimen Description BLOOD LEFT ARM  Final   Special Requests   Final    BOTTLES DRAWN AEROBIC AND ANAEROBIC Blood Culture adequate volume   Culture   Final    NO GROWTH 5 DAYS Performed at [facility], [address] [place], [place]    Report Status 05/26/2019 FINAL  Final  MRSA PCR Screening     Status: None   Collection Time: 05/21/19  1:45 PM   Specimen: Nasopharyngeal  Result Value Ref Range Status   MRSA by PCR NEGATIVE NEGATIVE Final    Comment:        The GeneXpert MRSA Assay (FDA approved for NASAL specimens only), is one component of a comprehensive MRSA colonization surveillance program. It is not intended to diagnose MRSA infection nor to guide or monitor treatment for MRSA infections. Performed at [facility], [address] [place], [place]          Radiology Studies: No results found.      Scheduled Meds: . aspirin EC  81 mg Oral Daily  . atorvastatin  80 mg Oral Daily  . benzonatate  200 mg Oral BID  . carvedilol  6.25 mg Oral BID  . enoxaparin (LOVENOX) injection  50 mg Subcutaneous Q24H  . furosemide  20 mg Intravenous Daily  . sertraline  50 mg  Oral Daily   Continuous Infusions:    LOS: 7 days   Time spent:  Zannie Cove, MD [facility]  05/27/2019, 11:06 AM

## 2019-05-28 DIAGNOSIS — I2510 Atherosclerotic heart disease of native coronary artery without angina pectoris: Secondary | ICD-10-CM

## 2019-05-28 LAB — BASIC METABOLIC PANEL
Anion gap: 11 (ref 5–15)
BUN: 5 mg/dL — ABNORMAL LOW (ref 6–20)
CO2: 26 mmol/L (ref 22–32)
Calcium: 8.7 mg/dL — ABNORMAL LOW (ref 8.9–10.3)
Chloride: 100 mmol/L (ref 98–111)
Creatinine, Ser: 1.57 mg/dL — ABNORMAL HIGH (ref 0.44–1.00)
GFR calc Af Amer: 47 mL/min — ABNORMAL LOW (ref >60)
GFR calc non Af Amer: 41 mL/min — ABNORMAL LOW (ref >60)
Glucose, Bld: 131 mg/dL — ABNORMAL HIGH (ref 70–99)
Potassium: 4 mmol/L (ref 3.5–5.1)
Sodium: 137 mmol/L (ref 135–145)

## 2019-05-28 LAB — CBC
HCT: 37.3 % (ref 36.0–46.0)
Hemoglobin: 11.7 g/dL — ABNORMAL LOW (ref 12.0–15.0)
MCH: 28.7 pg (ref 26.0–34.0)
MCHC: 31.4 g/dL (ref 30.0–36.0)
MCV: 91.4 fL (ref 80.0–100.0)
Platelets: 351 10*3/uL (ref 150–400)
RBC: 4.08 MIL/uL (ref 3.87–5.11)
RDW: 13.7 % (ref 11.5–15.5)
WBC: 7.1 10*3/uL (ref 4.0–10.5)
nRBC: 0 % (ref 0.0–0.2)

## 2019-05-28 LAB — C-REACTIVE PROTEIN: CRP: 3.4 mg/dL — ABNORMAL HIGH (ref <1.0)

## 2019-05-28 NOTE — Progress Notes (Signed)
Physical Therapy Treatment Patient Details Name: Maria Bradford MRN: [id number] DOB: 05-Dec-1980 Today's Date: 05/28/2019    History of Present Illness Pt is 39 yo female with PMH including CAD with STEMI in 2018, s/p angioplasty and stenting, HTN, hyperlipidemia, and obesity.  Pt with COVID on 4/19 requiring hospitalization and d/c on 5/3.  Pt returned to hospital on 5/11 with progressive SHOB.  CTA - for PE but with multifocal PNE.    PT Comments    Pt demonstrating improved tolerance for activity today.  During last visit pt had coughing episode as she fatigued.  Today ambulated 2 bouts of gait with seated rest break, no coughing episode, and sats only with slight drop on 6 LPM O2 to 88% and pt able to talk and walk.  Pt is on 4 LPM rest and required 6 for activity.   Continue to advance mobility as able. Also encouraged to ambulate with nursing as able.    Follow Up Recommendations  Home health PT     Equipment Recommendations  None recommended by PT    Recommendations for Other Services       Precautions / Restrictions Precautions Precautions: Other (comment) Precaution Comments: watch O2    Mobility  Bed Mobility Overal bed mobility: Modified Independent             General bed mobility comments: EOB at arrival  Transfers Overall transfer level: Needs assistance Equipment used: None Transfers: Sit to/from Stand Sit to Stand: Supervision         General transfer comment: supervision for safety; performed sit to stand x 3; toielting ADLs independently  Ambulation/Gait Ambulation/Gait assistance: Min guard Gait Distance (Feet): 75 Feet(x2) Assistive device: None Gait Pattern/deviations: Step-through pattern     General Gait Details: Performed 60' x 2 with seated rest break.   Decreased distance today in order to prevent coughing episode with fatigue.  Pt with improved tolerance for activity with rest break.   Stairs             Wheelchair Mobility    Modified Rankin (Stroke Patients Only)       Balance Overall balance assessment: Mild deficits observed, not formally tested                                          Cognition Arousal/Alertness: Awake/alert Behavior During Therapy: WFL for tasks assessed/performed Overall Cognitive Status: Within Functional Limits for tasks assessed                                        Exercises      General Comments General comments (skin integrity, edema, etc.): Pt on 4 LPM O2 with sats 93% rest.  On 6 LPM O2 for walking with sats 88% and taking 1 min to recover.  Placed back on 4 LPM O2 with sats 94%. No coughing episodes today.      Pertinent Vitals/Pain Pain Assessment: 0-10 Pain Score: 5  Pain Location: menstrual cramps Pain Descriptors / Indicators: Cramping Pain Intervention(s): RN gave pain meds during session;Limited activity within patient's tolerance    Home Living                      Prior Function  PT Goals (current goals can now be found in the care plan section) Acute Rehab PT Goals Patient Stated Goal: breathe better PT Goal Formulation: With patient Time For Goal Achievement: 06/06/19 Potential to Achieve Goals: Good Progress towards PT goals: Progressing toward goals    Frequency    Min 3X/week      PT Plan Current plan remains appropriate    Co-evaluation              AM-PAC PT "6 Clicks" Mobility   Outcome Measure  Help needed turning from your back to your side while in a flat bed without using bedrails?: None Help needed moving from lying on your back to sitting on the side of a flat bed without using bedrails?: None Help needed moving to and from a bed to a chair (including a wheelchair)?: None Help needed standing up from a chair using your arms (e.g., wheelchair or bedside chair)?: None Help needed to walk in hospital room?: None Help needed climbing 3-5 steps with a railing? : A  Little 6 Click Score: 23    End of Session Equipment Utilized During Treatment: Oxygen Activity Tolerance: Patient tolerated treatment well Patient left: with call bell/phone within reach;in bed Nurse Communication: Mobility status PT Visit Diagnosis: Other abnormalities of gait and mobility (R26.89);Muscle weakness (generalized) (M62.81)     Time: 1405-1430 PT Time Calculation (min) (ACUTE ONLY): 25 min  Charges:  $Gait Training: 8-22 mins $Therapeutic Activity: 8-22 mins                     Royetta Asal, PT Acute Rehab Services Pager [phone] [facility] [phone] [facility] [phone]    Rayetta Humphrey 05/28/2019, 2:43 PM

## 2019-05-28 NOTE — Progress Notes (Signed)
PROGRESS NOTE  Maria Bradford  HDQ:[id number] DOB: 1980/08/07 DOA: 05/20/2019 PCP: Gillian Scarce   Brief Narrative: 39 year old female with past medical history of coronary artery disease(inferior wall STEMI 05/2016, S/P angioplasty and stenting of RCA), hypertension, hyperlipidemia, obesity and recent diagnosis with COVID-19 on 4/19 requiring hospitalization at [facility] and discharged on 5/3 on 2 L of oxygen presented to the emergency room on 5/11 with progressive shortness of breath, slowly progressive over the week, reports ongoing cough intermittently productive of whitish to clear sputum. Work-up in the ED noted increasing oxygen requirement, CTA chest was negative for PE showed bilateral multifocal pneumonia. -She was started on broad-spectrum antibiotics to cover for secondary bacterial pneumonia -Pulm following, continues to have considerable oxygen requirement and very slow improvement.   Assessment & Plan: Principal Problem:   Acute respiratory failure with hypoxia (HCC) Active Problems:   Sepsis due to pneumonia (HCC)   History of COVID-19   Pneumonia of both lungs due to infectious organism   Mixed hyperlipidemia   Essential hypertension   Coronary artery disease involving native coronary artery of native heart without angina pectoris   Acute respiratory failure with hypoxemia (HCC)  Acute respiratory failure with hypoxia: Multifactorial including protracted recovery from covid-19 pneumonia (hospitalized 4/26, discharged on 2L O2 5/3) and suspected secondary bacterial pneumonia. CTA chest negative for PE. Also diuresing for presence of pleural effusions seen on CXR 5/16.  - Completed her Covid isolation on 5/10, completed 10day course of Decadron and 5 days of remdesivir - s/p ceftriaxone, azithromycin x7 days.  - Continue IV lasix, monitoring I/O (inconsistently documented, but subjectively increased UOP), daily weights - Very slow clinical improvement, still  requiring 6L O2 (desaturated at rest when attempted to turn to 4L this AM). Check CXR in AM.  - Appreciate pulmonary input, did not feel she needed continued steroid therapy - Fortunately CRP is trending down, procalcitonin repeat also is less than 0.1 - OOB as much as possible, would be easy to become deconditioned with this level of lung disease.  Stage IIIa CKD: Based on available values.  - SCr up back to previous values today, will continue diuresis and monitor renal function avoiding nephrotoxins  History of CAD: s/p PCIx2 2018. No angina.  - Continue ASA, statin, BB.   HTN:  - Continue coreg  Obesity: Body mass index is 36.69 kg/m.  - Noted.    DVT prophylaxis: Lovenox Code Status: Full Family Communication: None at bedside Disposition Plan:  Status is: Inpatient  Remains inpatient appropriate because:continues to have respiratory distress with ambulation and persistent hypoxemia.   Dispo: The patient is from: Home              Anticipated d/c is to: Home              Anticipated d/c date is: 2 days              Patient currently is not medically stable to d/c.  Consultants:   PCCM  Procedures:   None  Antimicrobials:  Ceftriaxone, azithromycin   Subjective: Breathing about the same as yesterday, dyspneic moderately with any exertion, mildly at rest. No chest pain or fever or productive cough. No increased leg swelling. Urinating from lasix significantly  Objective: Vitals:   05/28/19 1300 05/28/19 1400 05/28/19 1410 05/28/19 1549  BP:    (!) 124/91  Pulse: 75 83 83 79  Resp: 10 (!) 23 (!) 25 (!) 24  Temp:    98.6 F (  37 C)  TempSrc:    Oral  SpO2: 94% (!) 85% 90% 90%  Weight:      Height:        Intake/Output Summary (Last 24 hours) at 05/28/2019 1753 Last data filed at 05/28/2019 1400 Gross per 24 hour  Intake 840 ml  Output 700 ml  Net 140 ml   Filed Weights   05/20/19 1625  Weight: 103.1 kg    Gen: 39 y.o. female in no distress    Pulm: Non-labored breathing at rest, still tachypneic, crackles/diminished at bases. No wheezing bilaterally.  CV: Regular rate and rhythm. No murmur, rub, or gallop. No JVD, no pedal edema. GI: Abdomen soft, non-tender, non-distended, with normoactive bowel sounds. No organomegaly or masses felt. Ext: Warm, no deformities Skin: No rashes, lesions or ulcers Neuro: Alert and oriented. No focal neurological deficits. Psych: Judgement and insight appear normal. Mood & affect appropriate.   Data Reviewed: I have personally reviewed following labs and imaging studies  CBC: Recent Labs  Lab 05/23/19 0923 05/25/19 0302 05/26/19 0242 05/27/19 0228 05/28/19 0255  WBC 7.3 7.1 7.3 7.3 7.1  HGB 11.3* 10.8* 11.2* 10.9* 11.7*  HCT 35.5* 34.0* 34.9* 34.0* 37.3  MCV 91.7 90.4 89.7 89.2 91.4  PLT 315 304 318 335 351   Basic Metabolic Panel: Recent Labs  Lab 05/23/19 0923 05/25/19 0302 05/26/19 0242 05/27/19 0228 05/28/19 0255  NA 139 139 138 140 137  K 4.2 3.8 3.6 3.3* 4.0  CL 102 104 100 102 100  CO2 26 27 27 27 26   GLUCOSE 105* 104* 93 103* 131*  BUN 11 8 7  <5* 5*  CREATININE 1.56* 1.35* 1.40* 1.34* 1.57*  CALCIUM 8.4* 8.4* 8.4* 8.3* 8.7*   GFR: Estimated Creatinine Clearance: 57.2 mL/min (A) (by C-G formula based on SCr of 1.57 mg/dL (H)). Liver Function Tests: No results for input(s): AST, ALT, ALKPHOS, BILITOT, PROT, ALBUMIN in the last 168 hours. No results for input(s): LIPASE, AMYLASE in the last 168 hours. No results for input(s): AMMONIA in the last 168 hours. Coagulation Profile: No results for input(s): INR, PROTIME in the last 168 hours. Cardiac Enzymes: No results for input(s): CKTOTAL, CKMB, CKMBINDEX, TROPONINI in the last 168 hours. BNP (last 3 results) No results for input(s): PROBNP in the last 8760 hours. HbA1C: No results for input(s): HGBA1C in the last 72 hours. CBG: No results for input(s): GLUCAP in the last 168 hours. Lipid Profile: No results for  input(s): CHOL, HDL, LDLCALC, TRIG, CHOLHDL, LDLDIRECT in the last 72 hours. Thyroid Function Tests: No results for input(s): TSH, T4TOTAL, FREET4, T3FREE, THYROIDAB in the last 72 hours. Anemia Panel: No results for input(s): VITAMINB12, FOLATE, FERRITIN, TIBC, IRON, RETICCTPCT in the last 72 hours. Urine analysis:    Component Value Date/Time   COLORURINE YELLOW 01/16/2014 0912   APPEARANCEUR CLOUDY (A) 01/16/2014 0912   LABSPEC 1.024 01/16/2014 0912   PHURINE 6.0 01/16/2014 0912   GLUCOSEU NEGATIVE 01/16/2014 0912   HGBUR NEGATIVE 01/16/2014 0912   BILIRUBINUR NEGATIVE 01/16/2014 0912   KETONESUR NEGATIVE 01/16/2014 0912   PROTEINUR 30 (A) 01/16/2014 0912   UROBILINOGEN 1.0 01/16/2014 0912   NITRITE NEGATIVE 01/16/2014 0912   LEUKOCYTESUR LARGE (A) 01/16/2014 0912   Recent Results (from the past 240 hour(s))  Blood culture (routine x 2)     Status: None   Collection Time: 05/20/19 10:08 PM   Specimen: BLOOD RIGHT FOREARM  Result Value Ref Range Status   Specimen Description BLOOD RIGHT FOREARM  Final   Special Requests   Final    BOTTLES DRAWN AEROBIC AND ANAEROBIC Blood Culture adequate volume   Culture   Final    NO GROWTH 5 DAYS Performed at [facility], [address], [place], [place] [address]    Report Status 05/25/2019 FINAL  Final  Expectorated sputum assessment w rflx to resp cult     Status: None   Collection Time: 05/21/19  3:06 AM   Specimen: Sputum  Result Value Ref Range Status   Specimen Description SPUTUM  Final   Special Requests NONE  Final   Sputum evaluation   Final    THIS SPECIMEN IS ACCEPTABLE FOR SPUTUM CULTURE Performed at [facility], [place] [address], [place], [place] [address]    Report Status 05/21/2019 FINAL  Final  Culture, respiratory     Status: None   Collection Time: 05/21/19  3:06 AM   Specimen: SPU  Result Value Ref Range Status   Specimen Description SPUTUM  Final   Special Requests NONE Reflexed from [id number]   Final   Gram Stain   Final    RARE WBC PRESENT, PREDOMINANTLY PMN FEW GRAM POSITIVE COCCI IN CLUSTERS RARE GRAM NEGATIVE RODS RARE GRAM POSITIVE RODS    Culture   Final    Consistent with normal respiratory flora. Performed at [facility], [place] [address] [place], [place] [address]    Report Status 05/23/2019 FINAL  Final  Blood culture (routine x 2)     Status: None   Collection Time: 05/21/19  3:55 AM   Specimen: BLOOD  Result Value Ref Range Status   Specimen Description BLOOD LEFT ARM  Final   Special Requests   Final    BOTTLES DRAWN AEROBIC AND ANAEROBIC Blood Culture adequate volume   Culture   Final    NO GROWTH 5 DAYS Performed at [facility], [place] [address] [place], [place] [address]    Report Status 05/26/2019 FINAL  Final  MRSA PCR Screening     Status: None   Collection Time: 05/21/19  1:45 PM   Specimen: Nasopharyngeal  Result Value Ref Range Status   MRSA by PCR NEGATIVE NEGATIVE Final    Comment:        The GeneXpert MRSA Assay (FDA approved for NASAL specimens only), is one component of a comprehensive MRSA colonization surveillance program. It is not intended to diagnose MRSA infection nor to guide or monitor treatment for MRSA infections. Performed at [facility], [place] [address], [place], [place] [address]       Radiology Studies: No results found.  Scheduled Meds: . aspirin EC  81 mg Oral Daily  . atorvastatin  80 mg Oral Daily  . benzonatate  200 mg Oral BID  . carvedilol  6.25 mg Oral BID  . enoxaparin (LOVENOX) injection  50 mg Subcutaneous Q24H  . furosemide  20 mg Intravenous Daily  . sertraline  50 mg Oral Daily   Continuous Infusions:   LOS: 8 days   Time spent: 25 minutes.  Patrecia Pour, MD [facility] [email or web address] 05/28/2019, 5:53 PM

## 2019-05-28 NOTE — Plan of Care (Signed)
  Problem: Activity: Goal: Ability to tolerate increased activity will improve Outcome: Progressing   Problem: Clinical Measurements: Goal: Ability to maintain a body temperature in the normal range will improve Outcome: Progressing   Problem: Respiratory: Goal: Ability to maintain adequate ventilation will improve Outcome: Progressing Goal: Ability to maintain a clear airway will improve Outcome: Progressing   

## 2019-05-29 ENCOUNTER — Inpatient Hospital Stay (HOSPITAL_COMMUNITY): Payer: Medicaid Other

## 2019-05-29 LAB — BASIC METABOLIC PANEL
Anion gap: 9 (ref 5–15)
BUN: 6 mg/dL (ref 6–20)
CO2: 27 mmol/L (ref 22–32)
Calcium: 8.7 mg/dL — ABNORMAL LOW (ref 8.9–10.3)
Chloride: 100 mmol/L (ref 98–111)
Creatinine, Ser: 1.54 mg/dL — ABNORMAL HIGH (ref 0.44–1.00)
GFR calc Af Amer: 48 mL/min — ABNORMAL LOW (ref >60)
GFR calc non Af Amer: 41 mL/min — ABNORMAL LOW (ref >60)
Glucose, Bld: 126 mg/dL — ABNORMAL HIGH (ref 70–99)
Potassium: 4.2 mmol/L (ref 3.5–5.1)
Sodium: 136 mmol/L (ref 135–145)

## 2019-05-29 LAB — BRAIN NATRIURETIC PEPTIDE: B Natriuretic Peptide: 33.7 pg/mL (ref 0.0–100.0)

## 2019-05-29 NOTE — Plan of Care (Signed)
  Problem: Activity: Goal: Ability to tolerate increased activity will improve Outcome: Progressing   Problem: Clinical Measurements: Goal: Ability to maintain a body temperature in the normal range will improve Outcome: Progressing   Problem: Respiratory: Goal: Ability to maintain adequate ventilation will improve Outcome: Progressing Goal: Ability to maintain a clear airway will improve Outcome: Progressing   

## 2019-05-29 NOTE — Progress Notes (Signed)
PROGRESS NOTE  Maria Bradford  OMV:[id number] DOB: 03/20/1980 DOA: 05/20/2019 PCP: Gillian Scarce   Brief Narrative: 39 year old female with past medical history of coronary artery disease(inferior wall STEMI 05/2016, S/P angioplasty and stenting of RCA), hypertension, hyperlipidemia, obesity and recent diagnosis with COVID-19 on 4/19 requiring hospitalization at [facility] and discharged on 5/3 on 2 L of oxygen presented to the emergency room on 5/11 with progressive shortness of breath, slowly progressive over the week, reports ongoing cough intermittently productive of whitish to clear sputum. Work-up in the ED noted increasing oxygen requirement, CTA chest was negative for PE showed bilateral multifocal pneumonia. -She was started on broad-spectrum antibiotics to cover for secondary bacterial pneumonia. We have also given trial of diuresis. -Pulm was consulted and had no further recommendations, continues to have considerable oxygen requirement and very slow improvement.   Assessment & Plan: Principal Problem:   Acute respiratory failure with hypoxia (HCC) Active Problems:   Sepsis due to pneumonia (HCC)   History of COVID-19   Pneumonia of both lungs due to infectious organism   Mixed hyperlipidemia   Essential hypertension   Coronary artery disease involving native coronary artery of native heart without angina pectoris   Acute respiratory failure with hypoxemia (HCC)  Acute respiratory failure with hypoxia: Multifactorial including protracted recovery from covid-19 pneumonia (hospitalized 4/26, discharged on 2L O2 5/3) and suspected secondary bacterial pneumonia. CTA chest negative for PE. Also diuresing for presence of pleural effusions seen on CXR 5/16.  - Completed her Covid isolation on 5/10, completed 10day course of Decadron and 5 days of remdesivir - s/p ceftriaxone, azithromycin x7 days.  - Continue IV lasix, monitoring I/O, daily weights. Will continue again today.  BNP is reassuring and no definite pulmonary edema on CXR on my personal review this morning, though keeping lungs as dry as possible may improve oxygenation. - Very slow clinical improvement, wean oxygen as tolerated, down to 4L today. Functional status improving, but not able to safely discharge home given severe exertional dyspnea, hypoxemia, deconditioning.   - Appreciate pulmonary input, did not feel she needed continued steroid therapy - Fortunately CRP is trending down, procalcitonin repeat also is less than 0.1 - OOB as much as possible   Stage IIIa CKD: Based on available values.  - SCr up back to previous values today, will continue diuresis and monitor renal function avoiding nephrotoxins  History of CAD: s/p PCIx2 2018. No angina.  - Continue ASA, statin, BB.   HTN:  - Continue coreg  Obesity: Body mass index is 36.69 kg/m.  - Noted.    DVT prophylaxis: Lovenox Code Status: Full Family Communication: None at bedside Disposition Plan:  Status is: Inpatient  Remains inpatient appropriate because:continues to have respiratory distress with ambulation and persistent hypoxemia. Once shortness of breath improves allowing for even modest functional mobility, she can be discharged with supplemental oxygen. Hoping continued IV diuresis accelerates this process.  Dispo: The patient is from: Home              Anticipated d/c is to: Home              Anticipated d/c date is: 1-2 days              Patient currently is not medically stable to d/c.  Consultants:   PCCM  Procedures:   None  Antimicrobials:  Ceftriaxone, azithromycin   Subjective: Got up and walked around with assistance yesterday, got to the nurses station but needed multiple  breaks. No chest pain. CXR reviewed with the patient this morning showing essentially completely stable bilateral R > L infiltrates.  Objective: Vitals:   05/28/19 1915 05/28/19 1935 05/29/19 0355 05/29/19 0900  BP:    118/82    Pulse:  78  81  Resp:  20    Temp: 98.5 F (36.9 C)  98.4 F (36.9 C) 97.6 F (36.4 C)  TempSrc: Oral  Oral Oral  SpO2:  99%    Weight:      Height:        Intake/Output Summary (Last 24 hours) at 05/29/2019 1115 Last data filed at 05/28/2019 1935 Gross per 24 hour  Intake 480 ml  Output 700 ml  Net -220 ml   Filed Weights   05/20/19 1625  Weight: 103.1 kg   Gen: 39 y.o. female in no distress Pulm: Nonlabored breathing 4L O2. Clearing crackles, better air movement, no wheezes. CV: Regular rate and rhythm. No murmur, rub, or gallop. No JVD, no dependent edema. GI: Abdomen soft, non-tender, non-distended, with normoactive bowel sounds.  Ext: Warm, no deformities Skin: No rashes, lesions or ulcers on visualized skin. Neuro: Alert and oriented. No focal neurological deficits. Psych: Judgement and insight appear fair. Mood euthymic & affect congruent. Behavior is appropriate.    Data Reviewed: I have personally reviewed following labs and imaging studies  CBC: Recent Labs  Lab 05/23/19 0923 05/25/19 0302 05/26/19 0242 05/27/19 0228 05/28/19 0255  WBC 7.3 7.1 7.3 7.3 7.1  HGB 11.3* 10.8* 11.2* 10.9* 11.7*  HCT 35.5* 34.0* 34.9* 34.0* 37.3  MCV 91.7 90.4 89.7 89.2 91.4  PLT 315 304 318 335 351   Basic Metabolic Panel: Recent Labs  Lab 05/25/19 0302 05/26/19 0242 05/27/19 0228 05/28/19 0255 05/29/19 0357  NA 139 138 140 137 136  K 3.8 3.6 3.3* 4.0 4.2  CL 104 100 102 100 100  CO2 27 27 27 26 27   GLUCOSE 104* 93 103* 131* 126*  BUN 8 7 <5* 5* 6  CREATININE 1.35* 1.40* 1.34* 1.57* 1.54*  CALCIUM 8.4* 8.4* 8.3* 8.7* 8.7*   GFR: Estimated Creatinine Clearance: 58.3 mL/min (A) (by C-G formula based on SCr of 1.54 mg/dL (H)). Liver Function Tests: No results for input(s): AST, ALT, ALKPHOS, BILITOT, PROT, ALBUMIN in the last 168 hours. No results for input(s): LIPASE, AMYLASE in the last 168 hours. No results for input(s): AMMONIA in the last 168  hours. Coagulation Profile: No results for input(s): INR, PROTIME in the last 168 hours. Cardiac Enzymes: No results for input(s): CKTOTAL, CKMB, CKMBINDEX, TROPONINI in the last 168 hours. BNP (last 3 results) No results for input(s): PROBNP in the last 8760 hours. HbA1C: No results for input(s): HGBA1C in the last 72 hours. CBG: No results for input(s): GLUCAP in the last 168 hours. Lipid Profile: No results for input(s): CHOL, HDL, LDLCALC, TRIG, CHOLHDL, LDLDIRECT in the last 72 hours. Thyroid Function Tests: No results for input(s): TSH, T4TOTAL, FREET4, T3FREE, THYROIDAB in the last 72 hours. Anemia Panel: No results for input(s): VITAMINB12, FOLATE, FERRITIN, TIBC, IRON, RETICCTPCT in the last 72 hours. Urine analysis:    Component Value Date/Time   COLORURINE YELLOW 01/16/2014 0912   APPEARANCEUR CLOUDY (A) 01/16/2014 0912   LABSPEC 1.024 01/16/2014 0912   PHURINE 6.0 01/16/2014 0912   GLUCOSEU NEGATIVE 01/16/2014 0912   HGBUR NEGATIVE 01/16/2014 0912   BILIRUBINUR NEGATIVE 01/16/2014 0912   KETONESUR NEGATIVE 01/16/2014 0912   PROTEINUR 30 (A) 01/16/2014 0912   UROBILINOGEN 1.0  01/16/2014 0912   NITRITE NEGATIVE 01/16/2014 0912   LEUKOCYTESUR LARGE (A) 01/16/2014 0912   Recent Results (from the past 240 hour(s))  Blood culture (routine x 2)     Status: None   Collection Time: 05/20/19 10:08 PM   Specimen: BLOOD RIGHT FOREARM  Result Value Ref Range Status   Specimen Description BLOOD RIGHT FOREARM  Final   Special Requests   Final    BOTTLES DRAWN AEROBIC AND ANAEROBIC Blood Culture adequate volume   Culture   Final    NO GROWTH 5 DAYS Performed at [facility], [address], [place], [place] [address]    Report Status 05/25/2019 FINAL  Final  Expectorated sputum assessment w rflx to resp cult     Status: None   Collection Time: 05/21/19  3:06 AM   Specimen: Sputum  Result Value Ref Range Status   Specimen Description SPUTUM  Final   Special  Requests NONE  Final   Sputum evaluation   Final    THIS SPECIMEN IS ACCEPTABLE FOR SPUTUM CULTURE Performed at [facility], [address] [place], [place] [address]    Report Status 05/21/2019 FINAL  Final  Culture, respiratory     Status: None   Collection Time: 05/21/19  3:06 AM   Specimen: SPU  Result Value Ref Range Status   Specimen Description SPUTUM  Final   Special Requests NONE Reflexed from [id number]  Final   Gram Stain   Final    RARE WBC PRESENT, PREDOMINANTLY PMN FEW GRAM POSITIVE COCCI IN CLUSTERS RARE GRAM NEGATIVE RODS RARE GRAM POSITIVE RODS    Culture   Final    Consistent with normal respiratory flora. Performed at [facility], [address], [place], [place] [address]    Report Status 05/23/2019 FINAL  Final  Blood culture (routine x 2)     Status: None   Collection Time: 05/21/19  3:55 AM   Specimen: BLOOD  Result Value Ref Range Status   Specimen Description BLOOD LEFT ARM  Final   Special Requests   Final    BOTTLES DRAWN AEROBIC AND ANAEROBIC Blood Culture adequate volume   Culture   Final    NO GROWTH 5 DAYS Performed at [facility], [address], [place], [place] [address]    Report Status 05/26/2019 FINAL  Final  MRSA PCR Screening     Status: None   Collection Time: 05/21/19  1:45 PM   Specimen: Nasopharyngeal  Result Value Ref Range Status   MRSA by PCR NEGATIVE NEGATIVE Final    Comment:        The GeneXpert MRSA Assay (FDA approved for NASAL specimens only), is one component of a comprehensive MRSA colonization surveillance program. It is not intended to diagnose MRSA infection nor to guide or monitor treatment for MRSA infections. Performed at [facility], [address] [place], [place] [address]       Radiology Studies: DG Chest 2 View  Result Date: 05/29/2019 CLINICAL DATA:  Acute respiratory failure.  Hypoxia. EXAM: CHEST - 2 VIEW COMPARISON:  05/25/2019. FINDINGS: Heart size stable. Diffuse  bilateral pulmonary infiltrates, most prominent on the right, again noted. Similar findings noted on prior exam. No pleural effusion or pneumothorax. IMPRESSION: Diffuse bilateral pulmonary infiltrates, most prominent right, again noted. Similar findings noted on prior exam. Electronically Signed   By: Maisie Fus  Register   On: 05/29/2019 08:40    Scheduled Meds: . aspirin  EC  81 mg Oral Daily  . atorvastatin  80 mg Oral Daily  . benzonatate  200 mg Oral BID  . carvedilol  6.25 mg Oral BID  . enoxaparin (LOVENOX) injection  50 mg Subcutaneous Q24H  . furosemide  20 mg Intravenous Daily  . sertraline  50 mg Oral Daily   Continuous Infusions:   LOS: 9 days   Time spent: 25 minutes.  Patrecia Pour, MD [facility] [email or web address] 05/29/2019, 11:15 AM

## 2019-05-30 MED ORDER — HYDROCOD POLST-CPM POLST ER 10-8 MG/5ML PO SUER: 5.0000 mL | Freq: Two times a day (BID) | ORAL | 0 refills | Status: DC | PRN

## 2019-05-30 MED ORDER — ATORVASTATIN CALCIUM 80 MG PO TABS: 80.0000 mg | ORAL_TABLET | Freq: Every day | ORAL | 0 refills | Status: AC

## 2019-05-30 NOTE — Discharge Summary (Signed)
Physician Discharge Summary  Maria Bradford ZOX:[id number] DOB: 1980-06-01 DOA: 05/20/2019  PCP: Burnis MedinFulbright, [place] E, PA-C  Admit date: 05/20/2019 Discharge date: 05/30/2019  Admitted From: Home Disposition: Home   Recommendations for Outpatient Follow-up:  1. Follow up with PCP in 1-2 weeks 2. Please obtain BMP/CBC in one week 3. Please recheck ambulatory pulse oximetry at follow up, discharged on 2L O2 with plans for pulmonology follow up if hypoxemia continues.  Home Health: PT, OT Equipment/Devices: 2L O2 Discharge Condition: Stable CODE STATUS: Full Diet recommendation: Heart healthy  Brief/Interim Summary: Maria Bradford is a 39 year old female with past medical history of coronary artery disease(inferior wall STEMI 05/2016, S/P angioplasty and stenting of RCA), HTN, HLD, obesity and recent diagnosis with COVID-19 on 4/19 requiring [facility] and discharged on 5/3 on 2 L of oxygen presented to the emergency room on 5/11 with progressive shortness of breath, ongoing cough intermittently productive of whitish to clear sputum. Work-up in the ED noted increasing oxygen requirement, CTA chest was negative for PE showed bilateral multifocal pneumonia. - She was started on broad-spectrum antibiotics to cover for secondary bacterial pneumonia. We have also given trial of diuresis, and she now appears euvolemic. - Pulm was consulted and had no further recommendations, continues to have considerable oxygen requirement andvery slowimprovement but stabilizing and functionally able to return home.  Discharge Diagnoses:  Principal Problem:   Acute respiratory failure with hypoxia (HCC) Active Problems:   Sepsis due to pneumonia [facility](HCC)   History of COVID-19   Pneumonia of both lungs due to infectious organism   Mixed hyperlipidemia   Essential hypertension   Coronary artery disease involving native coronary artery of native heart without angina pectoris   Acute respiratory  failure with hypoxemia (HCC)  Acute respiratory failure with hypoxia: Multifactorial including protracted recovery from covid-19 pneumonia (hospitalized 4/26, discharged on 2L O2 5/3) and suspected secondary bacterial pneumonia. CTA chest negative for PE. Also diuresed for presence of pleural effusions seen on CXR 5/16.  - Completed Covid isolation on 5/10, completed 10day course of Decadron and 5 days of remdesivir - s/p ceftriaxone, azithromycin x7 days.  - s/p lasix BNP is reassuring and no pulmonary edema on CXR. - Veryslowclinical improvement, wean oxygen as tolerated, down to 2L over past 24 hours.  - Continue antitussive. - Appreciate pulmonary input, did not feel she needed continued steroid therapy - Fortunately CRP is trending down, procalcitonin repeat also is less than 0.1 - OOB as much as possible   Stage IIIa CKD: Based on available values, stable.  History of CAD: s/p PCIx2 2018. No angina.  - Continue ASA, statin (new Rx sent at DC), BB.   HTN:  - Continue coreg  Obesity: Body mass index is 36.69 kg/m.  - Noted.   Discharge Instructions Discharge Instructions    Diet - low sodium heart healthy   Complete by: As directed    Discharge instructions   Complete by: As directed    You were admitted with continued respiratory failure which has improved. You were treated for bacterial pneumonia, given lasix to take off extra fluid which has been accomplished, and will continue using 2L oxygen at home with expectations that recovery will continue slowly. Follow up with your primary doctor in the next week and return if your shortness of breath worsens or you develop chest pain.   For home use only DME oxygen   Complete by: As directed    Length of Need: 6 Months   Mode  or (Route): Nasal cannula   Liters per Minute: 2   Frequency: Continuous (stationary and portable oxygen unit needed)   Oxygen delivery system: Gas   Increase activity slowly   Complete by: As  directed      Allergies as of 05/30/2019      Reactions   Oxycodone-acetaminophen Nausea And Vomiting   Oxycodone Nausea And Vomiting      Medication List    STOP taking these medications   dexamethasone 6 MG tablet Commonly known as: DECADRON     TAKE these medications   acetaminophen 500 MG tablet Commonly known as: TYLENOL Take 1,000 mg by mouth every 6 (six) hours as needed for fever or headache (pain). For pain   albuterol 108 (90 Base) MCG/ACT inhaler Commonly known as: VENTOLIN HFA Inhale 2 puffs into the lungs every 6 (six) hours as needed. What changed: reasons to take this   aspirin EC 81 MG tablet Take 81 mg by mouth daily.   atorvastatin 80 MG tablet Commonly known as: LIPITOR Take 1 tablet (80 mg total) by mouth daily. Start taking on: May 31, 2019   benzonatate 200 MG capsule Commonly known as: TESSALON Take 1 capsule (200 mg total) by mouth 3 (three) times daily as needed for cough.   carvedilol 6.25 MG tablet Commonly known as: COREG Take 1 tablet (6.25 mg total) by mouth 2 (two) times daily.   chlorpheniramine-HYDROcodone 10-8 MG/5ML Suer Commonly known as: TUSSIONEX Take 5 mLs by mouth every 12 (twelve) hours as needed for cough.   Emergen-C Immune Plus/Vit D Chew Chew 1 tablet by mouth 2 (two) times daily.   hydroxypropyl methylcellulose / hypromellose 2.5 % ophthalmic solution Commonly known as: ISOPTO TEARS / GONIOVISC Place 1 drop into both eyes 4 (four) times daily as needed for dry eyes.   Nexplanon 68 MG Impl implant Generic drug: etonogestrel 1 each by Subdermal route continuous. Implanted 2020   sertraline 50 MG tablet Commonly known as: ZOLOFT Take 50 mg by mouth daily.            Durable Medical Equipment  (From admission, onward)         Start     Ordered   05/30/19 0000  For home use only DME oxygen    Question Answer Comment  Length of Need 6 Months   Mode or (Route) Nasal cannula   Liters per Minute 2    Frequency Continuous (stationary and portable oxygen unit needed)   Oxygen delivery system Gas      05/30/19 0913         Follow-up Information    [place], [place] E, PA-C. Schedule an appointment as soon as possible for a visit in 1 week(s).   Specialty: Family Medicine Contact information: [phone] [place]urell [address] [place] [address] [phone]          Allergies  Allergen Reactions  . Oxycodone-Acetaminophen Nausea And Vomiting  . Oxycodone Nausea And Vomiting    Consultations:  Pulmonology  Procedures/Studies: DG Chest 1 View  Result Date: 05/21/2019 CLINICAL DATA:  COVID-19 pneumonia, shortness of breath EXAM: CHEST  1 VIEW COMPARISON:  05/20/2019 FINDINGS: Single frontal view of the chest demonstrates stable multifocal bilateral pneumonia. No effusion or pneumothorax. Cardiac silhouette is stable. IMPRESSION: 1. Stable bilateral multifocal pneumonia. Electronically Signed   By: Sharlet Salina M.D.   On: 05/21/2019 02:09   DG Chest 2 View  Result Date: 05/29/2019 CLINICAL DATA:  Acute respiratory failure.  Hypoxia.  EXAM: CHEST - 2 VIEW COMPARISON:  05/25/2019. FINDINGS: Heart size stable. Diffuse bilateral pulmonary infiltrates, most prominent on the right, again noted. Similar findings noted on prior exam. No pleural effusion or pneumothorax. IMPRESSION: Diffuse bilateral pulmonary infiltrates, most prominent right, again noted. Similar findings noted on prior exam. Electronically Signed   By: Maisie Fus  Register   On: 05/29/2019 08:40   DG Chest 2 View  Result Date: 05/25/2019 CLINICAL DATA:  COVID positive hypoxia, shortness of breath. EXAM: CHEST - 2 VIEW COMPARISON:  05/21/2019 FINDINGS: Lung volumes remain low. Cardiomediastinal contours are partially obscured due to diffuse interstitial and airspace disease in the RIGHT chest which has worsened since the previous exam. Opacities in the LEFT chest appear slightly more dense as well particularly in the  retrocardiac region. Probable RIGHT effusion and small LEFT effusion. Visualized skeletal structures are unremarkable. IMPRESSION: 1. More confluent appearance of disease in the RIGHT chest diffusely and in the LEFT chest particularly at LEFT lower lobe. 2. Small effusions bilaterally. Electronically Signed   By: Donzetta Kohut M.D.   On: 05/25/2019 11:46   DG Chest 2 View  Result Date: 05/20/2019 CLINICAL DATA:  Chest pain EXAM: CHEST - 2 VIEW COMPARISON:  05/09/2018 FINDINGS: Diffuse bilateral airspace disease, right greater than left again noted, unchanged. Low lung volumes. Heart is normal size. No visible effusions or pneumothorax. No acute bony abnormality. IMPRESSION: Severe diffuse bilateral airspace disease, right greater than left most compatible with pneumonia. No change. Electronically Signed   By: Charlett Nose M.D.   On: 05/20/2019 12:51   CT Angio Chest PE W/Cm &/Or Wo Cm  Result Date: 05/20/2019 CLINICAL DATA:  COVID-19 pneumonia, shortness of breath EXAM: CT ANGIOGRAPHY CHEST WITH CONTRAST TECHNIQUE: Multidetector CT imaging of the chest was performed using the standard protocol during bolus administration of intravenous contrast. Multiplanar CT image reconstructions and MIPs were obtained to evaluate the vascular anatomy. CONTRAST:  OMNIPAQUE IOHEXOL 350 MG/ML SOLN COMPARISON:  09/18/2016, 05/20/2019 FINDINGS: Cardiovascular: This is a technically adequate evaluation of the pulmonary vasculature. There are no filling defects or pulmonary emboli. Heart is unremarkable without pericardial effusion. Thoracic aorta is grossly normal without aneurysm or dissection. Mediastinum/Nodes: Mediastinal and right hilar adenopathy likely reactive. The thyroid, trachea, and esophagus are normal. Lungs/Pleura: There is multifocal bilateral airspace disease, right greater than left, consistent with multifocal pneumonia. No effusion or pneumothorax. Central airways are patent. Upper Abdomen: No acute  abnormality. Musculoskeletal: No acute or destructive bony lesions. Reconstructed images demonstrate no additional findings. Review of the MIP images confirms the above findings. IMPRESSION: 1. No evidence of pulmonary embolus. 2. Multifocal bilateral pneumonia consistent with COVID-19. Electronically Signed   By: Sharlet Salina M.D.   On: 05/20/2019 20:59   DG CHEST PORT 1 VIEW  Result Date: 05/09/2019 CLINICAL DATA:  COVID-19 positive. Tachycardia. EXAM: PORTABLE CHEST 1 VIEW COMPARISON:  05/08/2019 FINDINGS: Worsening streaky opacities in the right lung. Mild left lung opacities are unchanged. No pleural effusion. IMPRESSION: Worsening right and unchanged left lung opacities, likely infection. Electronically Signed   By: Deatra Robinson M.D.   On: 05/09/2019 01:57   DG CHEST PORT 1 VIEW  Result Date: 05/06/2019 CLINICAL DATA:  Cough, shortness of breath. EXAM: PORTABLE CHEST 1 VIEW COMPARISON:  May 05, 2019. FINDINGS: The heart size and mediastinal contours are within normal limits. Stable bilateral lung opacities are noted consistent with multifocal pneumonia. No pneumothorax or pleural effusion is noted. The visualized skeletal structures are unremarkable. IMPRESSION: Stable bilateral  lung opacities are noted consistent with multifocal pneumonia. Electronically Signed   By: Lupita Raider M.D.   On: 05/06/2019 09:17   DG Chest Port 1 View  Result Date: 05/05/2019 CLINICAL DATA:  COVID pneumonia EXAM: PORTABLE CHEST 1 VIEW COMPARISON:  May 03, 2019 FINDINGS: The heart size and mediastinal contours are within normal limits. Overall shallow degree of aeration. There is hazy/patchy airspace opacity seen within the right lower lung and periphery of the left lung base. No pleural effusion. No acute osseous abnormality. IMPRESSION: Slight interval worsening in the multifocal airspace opacities. Electronically Signed   By: Jonna Clark M.D.   On: 05/05/2019 01:32   DG Chest Port 1V same Day  Result  Date: 05/08/2019 CLINICAL DATA:  Shortness of breath, cough. EXAM: PORTABLE CHEST 1 VIEW COMPARISON:  May 06, 2019. FINDINGS: The heart size and mediastinal contours are within normal limits. No pneumothorax or pleural effusion is noted. Mildly increased bibasilar lung opacities are noted concerning for worsening atelectasis or pneumonia. The visualized skeletal structures are unremarkable. IMPRESSION: Mildly increased bibasilar lung opacities are noted concerning for worsening atelectasis or pneumonia. Electronically Signed   By: Lupita Raider M.D.   On: 05/08/2019 09:16   ECHOCARDIOGRAM COMPLETE  Result Date: 05/21/2019    ECHOCARDIOGRAM REPORT   Patient Name:   Dari Uncapher Date of Exam: 05/21/2019 Medical Rec #:  [id number]    Height:       66.0 in Accession #:    [id number]   Weight:       227.3 lb Date of Birth:  Oct 18, 1980    BSA:          2.112 m Patient Age:    39 years     BP:           141/99 mmHg Patient Gender: F            HR:           100 bpm. Exam Location:  Inpatient Procedure: 2D Echo, Cardiac Doppler and Color Doppler Indications:    CHF-Acute Systolic 428.21 / I50.21  History:        Patient has no prior history of Echocardiogram examinations.                 CAD; Risk Factors:Hypertension, Dyslipidemia and Non-Smoker.  Sonographer:    Renella Cunas RDCS Referring Phys: [phone] PREETHA JOSEPH IMPRESSIONS  1. Left ventricular ejection fraction, by estimation, is 65 to 70%. The left ventricle has normal function. The left ventricle has no regional wall motion abnormalities. Left ventricular diastolic parameters are consistent with Grade I diastolic dysfunction (impaired relaxation).  2. Right ventricular systolic function is normal. The right ventricular size is normal. There is normal pulmonary artery systolic pressure.  3. The mitral valve is normal in structure. Trivial mitral valve regurgitation. No evidence of mitral stenosis.  4. The aortic valve is normal in structure. Aortic valve  regurgitation is not visualized. No aortic stenosis is present.  5. The inferior vena cava is normal in size with greater than 50% respiratory variability, suggesting right atrial pressure of 3 mmHg. FINDINGS  Left Ventricle: Left ventricular ejection fraction, by estimation, is 65 to 70%. The left ventricle has normal function. The left ventricle has no regional wall motion abnormalities. The left ventricular internal cavity size was normal in size. There is  no left ventricular hypertrophy. Left ventricular diastolic parameters are consistent with Grade I diastolic dysfunction (impaired relaxation). Normal left ventricular filling pressure.  Right Ventricle: The right ventricular size is normal. No increase in right ventricular wall thickness. Right ventricular systolic function is normal. There is normal pulmonary artery systolic pressure. Left Atrium: Left atrial size was normal in size. Right Atrium: Right atrial size was normal in size. Pericardium: There is no evidence of pericardial effusion. Mitral Valve: The mitral valve is normal in structure. Normal mobility of the mitral valve leaflets. Trivial mitral valve regurgitation. No evidence of mitral valve stenosis. Tricuspid Valve: The tricuspid valve is normal in structure. Tricuspid valve regurgitation is not demonstrated. No evidence of tricuspid stenosis. Aortic Valve: The aortic valve is normal in structure. Aortic valve regurgitation is not visualized. No aortic stenosis is present. Pulmonic Valve: The pulmonic valve was normal in structure. Pulmonic valve regurgitation is not visualized. No evidence of pulmonic stenosis. Aorta: The aortic root is normal in size and structure. Venous: The inferior vena cava is normal in size with greater than 50% respiratory variability, suggesting right atrial pressure of 3 mmHg. IAS/Shunts: No atrial level shunt detected by color flow Doppler.  LEFT VENTRICLE PLAX 2D LVIDd:         3.89 cm     Diastology LVIDs:          2.70 cm     LV e' lateral:   9.46 cm/s LV PW:         0.91 cm     LV E/e' lateral: 6.6 LV IVS:        0.93 cm     LV e' medial:    5.87 cm/s LVOT diam:     1.70 cm     LV E/e' medial:  10.7 LV SV:         72 LV SV Index:   34 LVOT Area:     2.27 cm  LV Volumes (MOD) LV vol d, MOD A2C: 63.0 ml LV vol d, MOD A4C: 80.8 ml LV vol s, MOD A2C: 20.2 ml LV vol s, MOD A4C: 24.0 ml LV SV MOD A2C:     42.8 ml LV SV MOD A4C:     80.8 ml LV SV MOD BP:      48.4 ml RIGHT VENTRICLE TAPSE (M-mode): 1.6 cm LEFT ATRIUM             Index       RIGHT ATRIUM          Index LA diam:        3.60 cm 1.70 cm/m  RA Area:     7.63 cm LA Vol (A2C):   18.0 ml 8.52 ml/m  RA Volume:   13.60 ml 6.44 ml/m LA Vol (A4C):   21.8 ml 10.32 ml/m LA Biplane Vol: 21.1 ml 9.99 ml/m  AORTIC VALVE LVOT Vmax:   161.00 cm/s LVOT Vmean:  110.000 cm/s LVOT VTI:    0.319 m  AORTA Ao Root diam: 3.00 cm MITRAL VALVE MV Area (PHT): 4.29 cm    SHUNTS MV Decel Time: 177 msec    Systemic VTI:  0.32 m MV E velocity: 62.60 cm/s  Systemic Diam: 1.70 cm MV A velocity: 78.40 cm/s MV E/A ratio:  0.80 Tobias Alexander MD Electronically signed by Tobias Alexander MD Signature Date/Time: 05/21/2019/12:50:14 PM    Final     Subjective: Down to 2L all day yesterday, shortness of breath improving, much worse with coughing spells which are nonproductive and persistent. Wants to go home.   Discharge Exam: Vitals:   05/30/19 0400 05/30/19 0802  BP: Marland Kitchen)  122/92 116/78  Pulse: 82   Resp: 19   Temp: 98.9 F (37.2 C) 98 F (36.7 C)  SpO2: 97%    General: Pt is alert, awake, not in acute distress Cardiovascular: RRR, S1/S2 +, no rubs, no gallops Respiratory: CTA bilaterally, no wheezing, no rhonchi Abdominal: Soft, NT, ND, bowel sounds + Extremities: No edema, no cyanosis  Labs: BNP (last 3 results) Recent Labs    05/10/19 0512 05/20/19 2259 05/29/19 0357  BNP 28.9 73.9 33.7   Basic Metabolic Panel: Recent Labs  Lab 05/25/19 0302 05/26/19 0242  05/27/19 0228 05/28/19 0255 05/29/19 0357  NA 139 138 140 137 136  K 3.8 3.6 3.3* 4.0 4.2  CL 104 100 102 100 100  CO2 27 27 27 26 27   GLUCOSE 104* 93 103* 131* 126*  BUN 8 7 <5* 5* 6  CREATININE 1.35* 1.40* 1.34* 1.57* 1.54*  CALCIUM 8.4* 8.4* 8.3* 8.7* 8.7*   Liver Function Tests: No results for input(s): AST, ALT, ALKPHOS, BILITOT, PROT, ALBUMIN in the last 168 hours. No results for input(s): LIPASE, AMYLASE in the last 168 hours. No results for input(s): AMMONIA in the last 168 hours. CBC: Recent Labs  Lab 05/25/19 0302 05/26/19 0242 05/27/19 0228 05/28/19 0255  WBC 7.1 7.3 7.3 7.1  HGB 10.8* 11.2* 10.9* 11.7*  HCT 34.0* 34.9* 34.0* 37.3  MCV 90.4 89.7 89.2 91.4  PLT 304 318 335 351   Cardiac Enzymes: No results for input(s): CKTOTAL, CKMB, CKMBINDEX, TROPONINI in the last 168 hours. BNP: Invalid input(s): POCBNP CBG: No results for input(s): GLUCAP in the last 168 hours. D-Dimer No results for input(s): DDIMER in the last 72 hours. Hgb A1c No results for input(s): HGBA1C in the last 72 hours. Lipid Profile No results for input(s): CHOL, HDL, LDLCALC, TRIG, CHOLHDL, LDLDIRECT in the last 72 hours. Thyroid function studies No results for input(s): TSH, T4TOTAL, T3FREE, THYROIDAB in the last 72 hours.  Invalid input(s): FREET3 Anemia work up No results for input(s): VITAMINB12, FOLATE, FERRITIN, TIBC, IRON, RETICCTPCT in the last 72 hours. Urinalysis    Component Value Date/Time   COLORURINE YELLOW 01/16/2014 0912   APPEARANCEUR CLOUDY (A) 01/16/2014 0912   LABSPEC 1.024 01/16/2014 0912   PHURINE 6.0 01/16/2014 0912   GLUCOSEU NEGATIVE 01/16/2014 0912   HGBUR NEGATIVE 01/16/2014 0912   BILIRUBINUR NEGATIVE 01/16/2014 0912   KETONESUR NEGATIVE 01/16/2014 0912   PROTEINUR 30 (A) 01/16/2014 0912   UROBILINOGEN 1.0 01/16/2014 0912   NITRITE NEGATIVE 01/16/2014 0912   LEUKOCYTESUR LARGE (A) 01/16/2014 0912    Microbiology Recent Results (from the past  240 hour(s))  Blood culture (routine x 2)     Status: None   Collection Time: 05/20/19 10:08 PM   Specimen: BLOOD RIGHT FOREARM  Result Value Ref Range Status   Specimen Description BLOOD RIGHT FOREARM  Final   Special Requests   Final    BOTTLES DRAWN AEROBIC AND ANAEROBIC Blood Culture adequate volume   Culture   Final    NO GROWTH 5 DAYS Performed at [facility], [address] [place], [place]    Report Status 05/25/2019 FINAL  Final  Expectorated sputum assessment w rflx to resp cult     Status: None   Collection Time: 05/21/19  3:06 AM   Specimen: Sputum  Result Value Ref Range Status   Specimen Description SPUTUM  Final   Special Requests NONE  Final   Sputum evaluation   Final    THIS  SPECIMEN IS ACCEPTABLE FOR SPUTUM CULTURE Performed at [facility], [place] [address], [place], [place] [address]    Report Status 05/21/2019 FINAL  Final  Culture, respiratory     Status: None   Collection Time: 05/21/19  3:06 AM   Specimen: SPU  Result Value Ref Range Status   Specimen Description SPUTUM  Final   Special Requests NONE Reflexed from [id number]  Final   Gram Stain   Final    RARE WBC PRESENT, PREDOMINANTLY PMN FEW GRAM POSITIVE COCCI IN CLUSTERS RARE GRAM NEGATIVE RODS RARE GRAM POSITIVE RODS    Culture   Final    Consistent with normal respiratory flora. Performed at [facility], [place] [address] [place], [place] [address]    Report Status 05/23/2019 FINAL  Final  Blood culture (routine x 2)     Status: None   Collection Time: 05/21/19  3:55 AM   Specimen: BLOOD  Result Value Ref Range Status   Specimen Description BLOOD LEFT ARM  Final   Special Requests   Final    BOTTLES DRAWN AEROBIC AND ANAEROBIC Blood Culture adequate volume   Culture   Final    NO GROWTH 5 DAYS Performed at [facility], [place] [address], [place], [place] [address]    Report Status 05/26/2019 FINAL  Final  MRSA PCR Screening     Status: None   Collection  Time: 05/21/19  1:45 PM   Specimen: Nasopharyngeal  Result Value Ref Range Status   MRSA by PCR NEGATIVE NEGATIVE Final    Comment:        The GeneXpert MRSA Assay (FDA approved for NASAL specimens only), is one component of a comprehensive MRSA colonization surveillance program. It is not intended to diagnose MRSA infection nor to guide or monitor treatment for MRSA infections. Performed at [facility], [place] [address] [place], [place] [address]     Time coordinating discharge: Approximately 40 minutes  Patrecia Pour, MD  [facility] 05/30/2019, 10:21 AM

## 2019-05-30 NOTE — Progress Notes (Signed)
Physical Therapy Treatment Patient Details Name: Maria Bradford MRN: [id number] DOB: [birth year] Today's Date: 05/30/2019    History of Present Illness Pt is 39 yo female with PMH including CAD with STEMI in 2018, s/p angioplasty and stenting, HTN, hyperlipidemia, and obesity.  Pt with COVID on 4/19 requiring hospitalization and d/c on 5/3.  Pt returned to hospital on 5/11 with progressive SHOB.  CTA - for PE but with multifocal PNE.    PT Comments    Emphasis on helping pt understand her oxygen needs at present.   2L at rest and 4 L with activity minimum.  Pt ready from mobility stand point to d/c.   Follow Up Recommendations  No PT follow up;Supervision/Assistance - 24 hour;Supervision - Intermittent     Equipment Recommendations  None recommended by PT    Recommendations for Other Services       Precautions / Restrictions Precautions Precaution Comments: watch O2    Mobility  Bed Mobility Overal bed mobility: Modified Independent                Transfers Overall transfer level: Modified independent                  Ambulation/Gait Ambulation/Gait assistance: Supervision Gait Distance (Feet): 250 Feet Assistive device: None Gait Pattern/deviations: Step-through pattern Gait velocity: decreased, but due to oxygen needs. Gait velocity interpretation: <1.8 ft/sec, indicate of risk for recurrent falls General Gait Details: Pull the portable O2 throughout, Steady overall.  On 2L during gait, SpO2 dropped to low 80's, on 3L [place] with gait sats dropped to 83%  HR in the 90's, but on 4L [place] sats maintained at 91% with HR in the 90's.     Stairs Stairs: Yes Stairs assistance: Supervision Stair Management: One rail Right Number of Stairs: 3 General stair comments: save with rails   Wheelchair Mobility    Modified Rankin (Stroke Patients Only)       Balance Overall balance assessment: No apparent balance deficits (not formally assessed)                                           Cognition Arousal/Alertness: Awake/alert Behavior During Therapy: WFL for tasks assessed/performed Overall Cognitive Status: Within Functional Limits for tasks assessed                                        Exercises      General Comments        Pertinent Vitals/Pain Pain Assessment: Faces Faces Pain Scale: No hurt Pain Intervention(s): Monitored during session    Home Living                      Prior Function            PT Goals (current goals can now be found in the care plan section) Acute Rehab PT Goals PT Goal Formulation: With patient Time For Goal Achievement: 06/06/19 Potential to Achieve Goals: Good Progress towards PT goals: Progressing toward goals    Frequency    Min 3X/week      PT Plan Current plan remains appropriate    Co-evaluation              AM-PAC PT "6 Clicks" Mobility   Outcome Measure  Help needed turning from your back to your side while in a flat bed without using bedrails?: None Help needed moving from lying on your back to sitting on the side of a flat bed without using bedrails?: None Help needed moving to and from a bed to a chair (including a wheelchair)?: None Help needed standing up from a chair using your arms (e.g., wheelchair or bedside chair)?: None Help needed to walk in hospital room?: None Help needed climbing 3-5 steps with a railing? : None 6 Click Score: 24    End of Session   Activity Tolerance: Patient tolerated treatment well Patient left: with call bell/phone within reach;in bed Nurse Communication: Mobility status PT Visit Diagnosis: Other abnormalities of gait and mobility (R26.89);Muscle weakness (generalized) (M62.81)     Time: 5009-3818 PT Time Calculation (min) (ACUTE ONLY): 18 min  Charges:  $Gait Training: 8-22 mins                     05/30/2019  Ginger Carne., PT Acute Rehabilitation Services [phone]   (pager) [phone]  (office)   Tessie Fass Kyllian Clingerman 05/30/2019, 12:35 PM

## 2019-05-30 NOTE — Progress Notes (Signed)
Pt. was discharged home with oxygen. Pt. left with family via car. PT belongings in hand. Discharge summer and medication went over.

## 2019-06-04 ENCOUNTER — Other Ambulatory Visit: Payer: Self-pay

## 2019-06-04 ENCOUNTER — Encounter: Payer: Self-pay | Admitting: Acute Care

## 2019-06-04 ENCOUNTER — Ambulatory Visit (INDEPENDENT_AMBULATORY_CARE_PROVIDER_SITE_OTHER): Payer: Medicaid Other | Admitting: Acute Care

## 2019-06-04 ENCOUNTER — Ambulatory Visit (INDEPENDENT_AMBULATORY_CARE_PROVIDER_SITE_OTHER): Payer: Medicaid Other

## 2019-06-04 VITALS — BP 140/80 | HR 100 | Temp 98.1°F

## 2019-06-04 DIAGNOSIS — J1282 Pneumonia due to coronavirus disease 2019: Secondary | ICD-10-CM

## 2019-06-04 DIAGNOSIS — J9601 Acute respiratory failure with hypoxia: Secondary | ICD-10-CM | POA: Diagnosis not present

## 2019-06-04 DIAGNOSIS — U071 COVID-19: Secondary | ICD-10-CM

## 2019-06-04 MED ORDER — PREDNISONE 10 MG PO TABS: 30.0000 mg | ORAL_TABLET | Freq: Every day | ORAL | 0 refills | Status: DC

## 2019-06-04 MED ORDER — LEVOFLOXACIN 500 MG PO TABS: 500.0000 mg | ORAL_TABLET | Freq: Every day | ORAL | 0 refills | Status: AC

## 2019-06-04 NOTE — Progress Notes (Signed)
History of Present Illness Maria Bradford is a 39 y/o F admitted 5/11 with reports of progressive shortness of breath.  Hx of post-COVID.  PE ruled out but imaging shows dense R>L infiltrate worrisome for post viral bacterial PNA. Increased O2 needs. She was seen as an inpatient by Dr. Vassie Loll. She is here today for hospital follow up.  Past Medical History:  Diagnosis Date  . Coronary artery disease   . Hypertension    06/05/2019  39 y.o. female never smoker Seen as an inpatient 5/11-5/21/2021 for acute respiratory failure with hypoxia: Multifactorial including protracted recovery from covid-19 pneumonia (Diagnosedhospitalized 4/26, discharged on 2L O2 5/3) and suspected secondary bacterial pneumonia. CTA chest negative for PE. She also was  diuresed for presence of pleural effusions seen on CXR 5/16.  - Completed Covid isolation on 5/10, completed 10 day course of Decadron and 5 days of remdesivir - s/p ceftriaxone, azithromycin x7 days.  - She was discharged home on 2 L [place], with home PT/OT - Discharging MD asked for  recheck ambulatory pulse oximetry at follow up, discharged on 2L O2 with plans for pulmonology follow up if hypoxemia continues.  Past Medical History:  Diagnosis Date  . Coronary artery disease   . Hypertension     * Covid 19 requiring hospitalization  Pt. Presents today for follow up.She presented ambulatory on her 4 L [place]. She then had a coughing spell, and became very short of breath. Her oxygen saturations at the time were 93% on 4 L . Her RR was 22 and her HR was 100. BP was 140/80. She had significantly increased work of breathing on exam.. She states she does have these coughing jags, and she does get very short of breath with them.  She is coughing up clear secretions.  She is using Robitussin Cough syrup for her cough. She states she is taking Lasix daily. She has no lower extremity edema. She is compliant with her oxygen. She denies any fever, chest pain, she  does get short of breath when she is supine, so she has been sleeping on her side. She denies any leg pain or swelling. No hemoptysis.She has been trying to get up and move around, but she gets short of breath with exertion, and her cough can also make her dyspnea worse.   Pulmonary Embolism Wells Score Select Criteria:  Symptoms of DVT (3 points)>> No  No alternative diagnosis better explains the illness (3 points) Yes ( resolving pneumonia on the right, persistent infiltrates on the left.)  Tachycardia with pulse > 100 (1.5 points)>> No>> pulse is 100, came down to 85  Immobilization (>= 3 days) or surgery in the previous four weeks (1.5 points) No  Prior history of DVT or pulmonary embolism (1.5 points) No  Presence of hemoptysis (1 point)>> No  Presence of malignancy (1 point)>> No Results: Total Criteria Point Count:   0  Pulmonary Embolism Risk Score Interpretation Score > 6: High probability Score >= 2 and <= 6: Moderate probability Score < 2: Low Probability  .  Test Results: 06/04/2019 CXR The heart size and mediastinal contours are within normal limits. Stable left lung opacity is noted, although there is improved right lung opacity suggesting improving pneumonia. Elevated right hemidiaphragm is again noted. No definite pneumothorax or pleural effusion is noted. The visualized skeletal structures are unremarkable.  IMPRESSION: Stable left lung opacity is noted, although there is improved right lung opacity suggesting improving pneumonia.  05/29/2019 CXR Diffuse bilateral pulmonary  infiltrates, most prominent right, again noted. Similar findings noted on prior exam.  05/20/2019 CTA No evidence of pulmonary embolus. Multifocal bilateral pneumonia consistent with COVID-19.  Echo 05/21/2019 EF 65-70%, LV: Normal function, Grade 1 diastolic dysfunction,normal ventricular filling pressures, RV: Normal size RV systolic function is normal, Normal PA systolic pressure, LA  Normal, RA Normal , No pericardial effusion noted, MV: Normal, trivial  regurgitation, TV, NO regurgitation, AV: normal without regurgitation or stenosis, PV, Normal in structure, no regurgitation or stenosis  CBC Latest Ref Rng & Units 05/28/2019 05/27/2019 05/26/2019  WBC 4.0 - 10.5 K/uL 7.1 7.3 7.3  Hemoglobin 12.0 - 15.0 g/dL 11.7(L) 10.9(L) 11.2(L)  Hematocrit 36.0 - 46.0 % 37.3 34.0(L) 34.9(L)  Platelets 150 - 400 K/uL 351 335 318    BMP Latest Ref Rng & Units 05/29/2019 05/28/2019 05/27/2019  Glucose 70 - 99 mg/dL 627(O) 350(K) 938(H)  BUN 6 - 20 mg/dL 6 5(L) <8(E)  Creatinine 0.44 - 1.00 mg/dL 9.93(Z) 1.69(C) 7.89(F)  Sodium 135 - 145 mmol/L 136 137 140  Potassium 3.5 - 5.1 mmol/L 4.2 4.0 3.3(L)  Chloride 98 - 111 mmol/L 100 100 102  CO2 22 - 32 mmol/L 27 26 27   Calcium 8.9 - 10.3 mg/dL ) 8.1(O) 8.3(L)    BNP    Component Value Date/Time   BNP 33.7 05/29/2019 0357    ProBNP No results found for: PROBNP  PFT No results found for: FEV1PRE, FEV1POST, FVCPRE, FVCPOST, TLC, DLCOUNC, PREFEV1FVCRT, PSTFEV1FVCRT  DG Chest 1 View  Result Date: 05/21/2019 CLINICAL DATA:  COVID-19 pneumonia, shortness of breath EXAM: CHEST  1 VIEW COMPARISON:  05/20/2019 FINDINGS: Single frontal view of the chest demonstrates stable multifocal bilateral pneumonia. No effusion or pneumothorax. Cardiac silhouette is stable. IMPRESSION: 1. Stable bilateral multifocal pneumonia. Electronically Signed   By: 07/20/2019 M.D.   On: 05/21/2019 02:09   DG Chest 2 View  Result Date: 06/04/2019 CLINICAL DATA:  Dyspnea. EXAM: CHEST - 2 VIEW COMPARISON:  May 29, 2019. FINDINGS: The heart size and mediastinal contours are within normal limits. Stable left lung opacity is noted, although there is improved right lung opacity suggesting improving pneumonia. Elevated right hemidiaphragm is again noted. No definite pneumothorax or pleural effusion is noted. The visualized skeletal structures are unremarkable.  IMPRESSION: Stable left lung opacity is noted, although there is improved right lung opacity suggesting improving pneumonia. Electronically Signed   By: May 31, 2019 M.D.   On: 06/04/2019 14:54   DG Chest 2 View  Result Date: 05/29/2019 CLINICAL DATA:  Acute respiratory failure.  Hypoxia. EXAM: CHEST - 2 VIEW COMPARISON:  05/25/2019. FINDINGS: Heart size stable. Diffuse bilateral pulmonary infiltrates, most prominent on the right, again noted. Similar findings noted on prior exam. No pleural effusion or pneumothorax. IMPRESSION: Diffuse bilateral pulmonary infiltrates, most prominent right, again noted. Similar findings noted on prior exam. Electronically Signed   By: 05/27/2019  Register   On: 05/29/2019 08:40   DG Chest 2 View  Result Date: 05/25/2019 CLINICAL DATA:  COVID positive hypoxia, shortness of breath. EXAM: CHEST - 2 VIEW COMPARISON:  05/21/2019 FINDINGS: Lung volumes remain low. Cardiomediastinal contours are partially obscured due to diffuse interstitial and airspace disease in the RIGHT chest which has worsened since the previous exam. Opacities in the LEFT chest appear slightly more dense as well particularly in the retrocardiac region. Probable RIGHT effusion and small LEFT effusion. Visualized skeletal structures are unremarkable. IMPRESSION: 1. More confluent appearance of disease in the RIGHT chest diffusely and  in the LEFT chest particularly at LEFT lower lobe. 2. Small effusions bilaterally. Electronically Signed   By: Zetta Bills M.D.   On: 05/25/2019 11:46   DG Chest 2 View  Result Date: 05/20/2019 CLINICAL DATA:  Chest pain EXAM: CHEST - 2 VIEW COMPARISON:  05/09/2018 FINDINGS: Diffuse bilateral airspace disease, right greater than left again noted, unchanged. Low lung volumes. Heart is normal size. No visible effusions or pneumothorax. No acute bony abnormality. IMPRESSION: Severe diffuse bilateral airspace disease, right greater than left most compatible with pneumonia. No  change. Electronically Signed   By: Rolm Baptise M.D.   On: 05/20/2019 12:51   CT Angio Chest PE W/Cm &/Or Wo Cm  Result Date: 05/20/2019 CLINICAL DATA:  COVID-19 pneumonia, shortness of breath EXAM: CT ANGIOGRAPHY CHEST WITH CONTRAST TECHNIQUE: Multidetector CT imaging of the chest was performed using the standard protocol during bolus administration of intravenous contrast. Multiplanar CT image reconstructions and MIPs were obtained to evaluate the vascular anatomy. CONTRAST:  152mL OMNIPAQUE IOHEXOL 350 MG/ML SOLN COMPARISON:  09/18/2016, 05/20/2019 FINDINGS: Cardiovascular: This is a technically adequate evaluation of the pulmonary vasculature. There are no filling defects or pulmonary emboli. Heart is unremarkable without pericardial effusion. Thoracic aorta is grossly normal without aneurysm or dissection. Mediastinum/Nodes: Mediastinal and right hilar adenopathy likely reactive. The thyroid, trachea, and esophagus are normal. Lungs/Pleura: There is multifocal bilateral airspace disease, right greater than left, consistent with multifocal pneumonia. No effusion or pneumothorax. Central airways are patent. Upper Abdomen: No acute abnormality. Musculoskeletal: No acute or destructive bony lesions. Reconstructed images demonstrate no additional findings. Review of the MIP images confirms the above findings. IMPRESSION: 1. No evidence of pulmonary embolus. 2. Multifocal bilateral pneumonia consistent with COVID-19. Electronically Signed   By: Randa Ngo M.D.   On: 05/20/2019 20:59   DG CHEST PORT 1 VIEW  Result Date: 05/09/2019 CLINICAL DATA:  COVID-19 positive. Tachycardia. EXAM: PORTABLE CHEST 1 VIEW COMPARISON:  05/08/2019 FINDINGS: Worsening streaky opacities in the right lung. Mild left lung opacities are unchanged. No pleural effusion. IMPRESSION: Worsening right and unchanged left lung opacities, likely infection. Electronically Signed   By: Ulyses Jarred M.D.   On: 05/09/2019 01:57   DG Chest  Port 1V same Day  Result Date: 05/08/2019 CLINICAL DATA:  Shortness of breath, cough. EXAM: PORTABLE CHEST 1 VIEW COMPARISON:  May 06, 2019. FINDINGS: The heart size and mediastinal contours are within normal limits. No pneumothorax or pleural effusion is noted. Mildly increased bibasilar lung opacities are noted concerning for worsening atelectasis or pneumonia. The visualized skeletal structures are unremarkable. IMPRESSION: Mildly increased bibasilar lung opacities are noted concerning for worsening atelectasis or pneumonia. Electronically Signed   By: Marijo Conception M.D.   On: 05/08/2019 09:16   ECHOCARDIOGRAM COMPLETE  Result Date: 05/21/2019    ECHOCARDIOGRAM REPORT   Patient Name:   Ayiana Danh Date of Exam: 05/21/2019 Medical Rec #:  [id number]    Height:       66.0 in Accession #:    [id number]   Weight:       227.3 lb Date of Birth:  [birth year]    BSA:          2.112 m Patient Age:    39 years     BP:           141/99 mmHg Patient Gender: F            HR:           100  bpm. Exam Location:  Inpatient Procedure: 2D Echo, Cardiac Doppler and Color Doppler Indications:    CHF-Acute Systolic 428.21 / I50.21  History:        Patient has no prior history of Echocardiogram examinations.                 CAD; Risk Factors:Hypertension, Dyslipidemia and Non-Smoker.  Sonographer:    Renella CunasJulia Swaim RDCS Referring Phys: [phone] PREETHA JOSEPH IMPRESSIONS  1. Left ventricular ejection fraction, by estimation, is 65 to 70%. The left ventricle has normal function. The left ventricle has no regional wall motion abnormalities. Left ventricular diastolic parameters are consistent with Grade I diastolic dysfunction (impaired relaxation).  2. Right ventricular systolic function is normal. The right ventricular size is normal. There is normal pulmonary artery systolic pressure.  3. The mitral valve is normal in structure. Trivial mitral valve regurgitation. No evidence of mitral stenosis.  4. The aortic valve is normal in  structure. Aortic valve regurgitation is not visualized. No aortic stenosis is present.  5. The inferior vena cava is normal in size with greater than 50% respiratory variability, suggesting right atrial pressure of 3 mmHg. FINDINGS  Left Ventricle: Left ventricular ejection fraction, by estimation, is 65 to 70%. The left ventricle has normal function. The left ventricle has no regional wall motion abnormalities. The left ventricular internal cavity size was normal in size. There is  no left ventricular hypertrophy. Left ventricular diastolic parameters are consistent with Grade I diastolic dysfunction (impaired relaxation). Normal left ventricular filling pressure. Right Ventricle: The right ventricular size is normal. No increase in right ventricular wall thickness. Right ventricular systolic function is normal. There is normal pulmonary artery systolic pressure. Left Atrium: Left atrial size was normal in size. Right Atrium: Right atrial size was normal in size. Pericardium: There is no evidence of pericardial effusion. Mitral Valve: The mitral valve is normal in structure. Normal mobility of the mitral valve leaflets. Trivial mitral valve regurgitation. No evidence of mitral valve stenosis. Tricuspid Valve: The tricuspid valve is normal in structure. Tricuspid valve regurgitation is not demonstrated. No evidence of tricuspid stenosis. Aortic Valve: The aortic valve is normal in structure. Aortic valve regurgitation is not visualized. No aortic stenosis is present. Pulmonic Valve: The pulmonic valve was normal in structure. Pulmonic valve regurgitation is not visualized. No evidence of pulmonic stenosis. Aorta: The aortic root is normal in size and structure. Venous: The inferior vena cava is normal in size with greater than 50% respiratory variability, suggesting right atrial pressure of 3 mmHg. IAS/Shunts: No atrial level shunt detected by color flow Doppler.  LEFT VENTRICLE PLAX 2D LVIDd:         3.89 cm      Diastology LVIDs:         2.70 cm     LV e' lateral:   9.46 cm/s LV PW:         0.91 cm     LV E/e' lateral: 6.6 LV IVS:        0.93 cm     LV e' medial:    5.87 cm/s LVOT diam:     1.70 cm     LV E/e' medial:  10.7 LV SV:         72 LV SV Index:   34 LVOT Area:     2.27 cm  LV Volumes (MOD) LV vol d, MOD A2C: 63.0 ml LV vol d, MOD A4C: 80.8 ml LV vol s, MOD A2C: 20.2 ml LV vol  s, MOD A4C: 24.0 ml LV SV MOD A2C:     42.8 ml LV SV MOD A4C:     80.8 ml LV SV MOD BP:      48.4 ml RIGHT VENTRICLE TAPSE (M-mode): 1.6 cm LEFT ATRIUM             Index       RIGHT ATRIUM          Index LA diam:        3.60 cm 1.70 cm/m  RA Area:     7.63 cm LA Vol (A2C):   18.0 ml 8.52 ml/m  RA Volume:   13.60 ml 6.44 ml/m LA Vol (A4C):   21.8 ml 10.32 ml/m LA Biplane Vol: 21.1 ml 9.99 ml/m  AORTIC VALVE LVOT Vmax:   161.00 cm/s LVOT Vmean:  110.000 cm/s LVOT VTI:    0.319 m  AORTA Ao Root diam: 3.00 cm MITRAL VALVE MV Area (PHT): 4.29 cm    SHUNTS MV Decel Time: 177 msec    Systemic VTI:  0.32 m MV E velocity: 62.60 cm/s  Systemic Diam: 1.70 cm MV A velocity: 78.40 cm/s MV E/A ratio:  0.80 Tobias Alexander MD Electronically signed by Tobias Alexander MD Signature Date/Time: 05/21/2019/12:50:14 PM    Final      Past medical hx Past Medical History:  Diagnosis Date  . Coronary artery disease   . Hypertension      Social History   Tobacco Use  . Smoking status: Never Smoker  . Smokeless tobacco: Never Used  Substance Use Topics  . Alcohol use: No  . Drug use: No    Ms.Astorino reports that she has never smoked. She has never used smokeless tobacco. She reports that she does not drink alcohol or use drugs.  Tobacco Cessation: Never Smoker  Past surgical hx, Family hx, Social hx all reviewed.  Current Outpatient Medications on File Prior to Visit  Medication Sig  . acetaminophen (TYLENOL) 500 MG tablet Take 1,000 mg by mouth every 6 (six) hours as needed for fever or headache (pain). For pain  . albuterol  (VENTOLIN HFA) 108 (90 Base) MCG/ACT inhaler Inhale 2 puffs into the lungs every 6 (six) hours as needed. (Patient taking differently: Inhale 2 puffs into the lungs every 6 (six) hours as needed for wheezing or shortness of breath. )  . aspirin EC 81 MG tablet Take 81 mg by mouth daily.  Marland Kitchen atorvastatin (LIPITOR) 80 MG tablet Take 1 tablet (80 mg total) by mouth daily.  . benzonatate (TESSALON) 200 MG capsule Take 1 capsule (200 mg total) by mouth 3 (three) times daily as needed for cough.  . carvedilol (COREG) 6.25 MG tablet Take 1 tablet (6.25 mg total) by mouth 2 (two) times daily.  . chlorpheniramine-HYDROcodone (TUSSIONEX) 10-8 MG/5ML SUER Take 5 mLs by mouth every 12 (twelve) hours as needed for cough.  . etonogestrel (NEXPLANON) 68 MG IMPL implant 1 each by Subdermal route continuous. Implanted 2020   . hydroxypropyl methylcellulose (ISOPTO TEARS) 2.5 % ophthalmic solution Place 1 drop into both eyes 4 (four) times daily as needed for dry eyes.  . Multiple Vitamins-Minerals (EMERGEN-C IMMUNE PLUS/VIT D) CHEW Chew 1 tablet by mouth 2 (two) times daily.  . sertraline (ZOLOFT) 50 MG tablet Take 50 mg by mouth daily.   No current facility-administered medications on file prior to visit.     Allergies  Allergen Reactions  . Oxycodone-Acetaminophen Nausea And Vomiting  . Oxycodone Nausea And Vomiting  Review Of Systems:  Constitutional:   No  weight loss, night sweats,  Fevers, chills, + fatigue, or  lassitude.  HEENT:   No headaches,  Difficulty swallowing,  Tooth/dental problems, or  Sore throat,                No sneezing, itching, ear ache, nasal congestion, post nasal drip,   CV:  No chest pain,  Orthopnea, PND, swelling in lower extremities, anasarca, dizziness, palpitations, syncope.   GI  No heartburn, indigestion, abdominal pain, nausea, vomiting, diarrhea, change in bowel habits, loss of appetite, bloody stools.   Resp: + shortness of breath with minimal exertion less at  rest.  No excess mucus, + productive cough,  + non-productive cough,  No coughing up of blood.  No change in color of mucus.  No wheezing.  No chest wall deformity  Skin: no rash or lesions.  GU: no dysuria, change in color of urine, no urgency or frequency.  No flank pain, no hematuria   MS:  No joint pain or swelling.  No decreased range of motion.  No back pain.  Extremities: No LE edema. Warm and dry, no tenderness behind her knees bilaterally, no cording noted in bilateral LE., brisk capillary refill.  Psych:  No change in mood or affect. No depression or anxiety.  No memory loss.   Vital Signs BP 140/80 (BP Location: Left Arm, Cuff Size: Large)   Pulse 100   Temp 98.1 F (36.7 C)   SpO2 93%    Physical Exam:  General- + distress,  A&Ox3, pleasant, with increased work of breathing ENT: No sinus tenderness, TM clear, pale nasal mucosa, no oral exudate,no post nasal drip, no LAN Cardiac: S1, S2, regular rate and rhythm, no murmur Chest: No wheeze/ rales/ dullness; no accessory muscle use, no nasal flaring, no sternal retractions Abd.: Soft Non-tender, ND, BS +, Ext: No clubbing cyanosis, No edema, warm and dry with brisk capillary refill. Neuro:  NCAT, Cranial Nerves intact, debilitated Skin: No rashes, No lesions, warm and dry Psych: normal mood and behavior   Assessment/Plan Hypoxic Respiratory Failure in setting of post Covid pneumonia ( Bilateral infiltrates. Treated with ceftriaxone, azithromycin x7 days as inpatient Continued significant dyspnea and increased work of breathing CXR with improving right infiltrate, stable left lung opacity Plan CXR now Wear oxygen at all times to maintain oxygen saturations > 94% We will treat you with Levaquin 500 mg x 7 days  Please take probiotic with antibiotic We will treat you with prednisone 30 mg daily for the next 7 days. Watch blood sugars if you are diabetic Follow up in 1 week with Maralyn Sago NP with CXR prior. Continue  oxygen at 3-4 liters 24/7 Saturation Goals are > 90% at all times.  Call if you get worse not better. Call if you need [place] sooner than your  1 week follow up.  If your breathing gets worse, seek emergency care If you cough up blood seek emergency care at the ER.  Please contact office for sooner follow up if symptoms do not improve or worsen or seek emergency care   This appointment was 45 min long with over 50% of the time in direct face-to-face patient care, assessment, plan of care, and follow-up.    Bevelyn Ngo, NP 06/05/2019  11:29 AM

## 2019-06-04 NOTE — Patient Instructions (Addendum)
It is good to see you today CXR shows improvement but not resolution of your bilateral pneumonia.  CBC and BMET today. We will call you with results We will prescribe Levaquin 500 mg once daily x 7 days Please take probiotic with antibiotic We will prescribe prednisone 30 mg once daily x 1 week. Watch blood sugars if you are diabetic Follow up in 1 week with Maralyn Sago NP with CXR prior. Continue oxygen at 3-4 liters 24/7 Saturation Goals are > 90% at all times.  Call if you get worse not better. If your breathing gets worse, seek emergency care Please contact office for sooner follow up if symptoms do not improve or worsen or seek emergency care

## 2019-06-05 ENCOUNTER — Encounter: Payer: Self-pay | Admitting: Acute Care

## 2019-06-05 NOTE — Progress Notes (Signed)
These results were discussed with the patient at the time of the OV. She verbalized understanding.

## 2019-06-11 ENCOUNTER — Ambulatory Visit: Payer: Medicaid Other | Admitting: Acute Care

## 2019-06-11 ENCOUNTER — Other Ambulatory Visit (INDEPENDENT_AMBULATORY_CARE_PROVIDER_SITE_OTHER): Payer: Medicaid Other

## 2019-06-11 ENCOUNTER — Ambulatory Visit (INDEPENDENT_AMBULATORY_CARE_PROVIDER_SITE_OTHER): Payer: Medicaid Other

## 2019-06-11 ENCOUNTER — Other Ambulatory Visit: Payer: Self-pay

## 2019-06-11 ENCOUNTER — Encounter: Payer: Self-pay | Admitting: Acute Care

## 2019-06-11 VITALS — BP 118/74 | HR 92 | Temp 98.3°F | Ht 66.5 in | Wt 241.3 lb

## 2019-06-11 DIAGNOSIS — J1282 Pneumonia due to coronavirus disease 2019: Secondary | ICD-10-CM

## 2019-06-11 DIAGNOSIS — N760 Acute vaginitis: Secondary | ICD-10-CM

## 2019-06-11 DIAGNOSIS — U071 COVID-19: Secondary | ICD-10-CM

## 2019-06-11 DIAGNOSIS — J9601 Acute respiratory failure with hypoxia: Secondary | ICD-10-CM | POA: Diagnosis not present

## 2019-06-11 LAB — CBC WITH DIFFERENTIAL/PLATELET
Basophils Absolute: 0.1 10*3/uL (ref 0.0–0.1)
Basophils Relative: 0.5 % (ref 0.0–3.0)
Eosinophils Absolute: 0 10*3/uL (ref 0.0–0.7)
Eosinophils Relative: 0.2 % (ref 0.0–5.0)
HCT: 39.3 % (ref 36.0–46.0)
Hemoglobin: 12.8 g/dL (ref 12.0–15.0)
Lymphocytes Relative: 21.7 % (ref 12.0–46.0)
Lymphs Abs: 3.2 10*3/uL (ref 0.7–4.0)
MCHC: 32.5 g/dL (ref 30.0–36.0)
MCV: 88.8 fl (ref 78.0–100.0)
Monocytes Absolute: 1 10*3/uL (ref 0.1–1.0)
Monocytes Relative: 7 % (ref 3.0–12.0)
Neutro Abs: 10.4 10*3/uL — ABNORMAL HIGH (ref 1.4–7.7)
Neutrophils Relative %: 70.6 % (ref 43.0–77.0)
Platelets: 463 10*3/uL — ABNORMAL HIGH (ref 150.0–400.0)
RBC: 4.43 Mil/uL (ref 3.87–5.11)
RDW: 14.5 % (ref 11.5–15.5)
WBC: 14.7 10*3/uL — ABNORMAL HIGH (ref 4.0–10.5)

## 2019-06-11 LAB — BASIC METABOLIC PANEL
BUN: 16 mg/dL (ref 6–23)
CO2: 26 mEq/L (ref 19–32)
Calcium: 9 mg/dL (ref 8.4–10.5)
Chloride: 105 mEq/L (ref 96–112)
Creatinine, Ser: 1.49 mg/dL — ABNORMAL HIGH (ref 0.40–1.20)
GFR: 46.58 mL/min — ABNORMAL LOW (ref >60.00)
Glucose, Bld: 236 mg/dL — ABNORMAL HIGH (ref 70–99)
Potassium: 3.6 mEq/L (ref 3.5–5.1)
Sodium: 137 mEq/L (ref 135–145)

## 2019-06-11 MED ORDER — FLUCONAZOLE 150 MG PO TABS: 150.0000 mg | ORAL_TABLET | Freq: Every day | ORAL | 0 refills | Status: AC

## 2019-06-11 NOTE — Patient Instructions (Addendum)
It is great to see you today. Wear oxygen as needed. Use rescue inhaler as needed. We will prescribe diflucan 150 mg once daily x 2 days You can also add daily probiotic tablets ( Culturelle) Please schedule appointment with PCP. Follow up in 2 weeks to ensure continue improvement. Please contact office for sooner follow up if symptoms do not improve or worsen or seek emergency care

## 2019-06-11 NOTE — Progress Notes (Signed)
History of Present Illness Maria Bradford is a 39 y.o. female  admitted 5/11 with reports of progressive shortness of breath. Hx of post-COVID 19 pneumonia. PE ruled out but imaging shows dense R>L infiltrate worrisome for post viral bacterial PNA. Increased O2 needs. She was seen as an inpatient by Dr. Vassie Loll. 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 6/2 she has had improvement in her oxygen needs.   06/11/2019  Pt. Presents for follow up. She has completed 5 days of Levaquin, and she has been off her oxygen for the last 3 days. She has one more day of prednisone to take . Sats on RA today are 96% after ambulating back to the exam room. She does have occasional shortness of breath with exertion. She stops and sits when her body tells her to. She has used her rescue inhaler x 1 since last week. She denies any wheezing,she does have a cough, but it is better than it was. She is taking the robitussin cough syrup in the morning and at bedtime. The cough is productive for clear secretions. She has not had any fever or sweating. She does have a case of vaginitis from antibiotic use. We will treat with diflucan.She denies shortness of breath orthopnea or hemoptysis.  Test Results: CXR 06/11/2019 Moderate right hemidiaphragm elevation. No pleural effusion or pneumothorax. Left greater than right perihilar interstitial opacities again identified. Felt to be similar on the left and minimally improved on the right. No new pulmonary opacity.  IMPRESSION: Minimally improved right and similar left-sided interstitial opacities, consistent with residual pneumonia or developing post infectious fibrosis.  06/04/2019 CXR The heart size and mediastinal contours are within normal limits. Stable left lung opacity is noted, although there is improved right lung opacity suggesting improving pneumonia. Elevated  right hemidiaphragm is again noted. No definite pneumothorax or pleural effusion is noted. The visualized skeletal structures are unremarkable.  IMPRESSION: Stable left lung opacity is noted, although there is improved right lung opacity suggesting improving pneumonia.  05/29/2019 CXR Diffuse bilateral pulmonary infiltrates, most prominent right, again noted. Similar findings noted on prior exam.  05/20/2019 CTA No evidence of pulmonary embolus. Multifocal bilateral pneumonia consistent with COVID-19.  Echo 05/21/2019 EF 65-70%, LV: Normal function, Grade 1 diastolic dysfunction,normal ventricular filling pressures, RV: Normal size RV systolic function is normal, Normal PA systolic pressure, LA Normal, RA Normal , No pericardial effusion noted, MV: Normal, trivial  regurgitation, TV, NO regurgitation, AV: normal without regurgitation or stenosis, PV, Normal in structure, no regurgitation or stenosis  CBC Latest Ref Rng & Units 06/11/2019 05/28/2019 05/27/2019  WBC 4.0 - 10.5 K/uL 14.7(H) 7.1 7.3  Hemoglobin 12.0 - 15.0 g/dL 16.1 11.7(L) 10.9(L)  Hematocrit 36.0 - 46.0 % 39.3 37.3 34.0(L)  Platelets 150.0 - 400.0 K/uL 463.0(H) 351 335    BMP Latest Ref Rng & Units 06/11/2019 05/29/2019 05/28/2019  Glucose 70 - 99 mg/dL 096(E) 454(U) 981(X)  BUN 6 - 23 mg/dL 16 6 5(L)  Creatinine 9.14 - 1.20 mg/dL 7.82(N) 5.62(Z) 3.08(M)  Sodium 135 - 145 mEq/L 137 136 137  Potassium 3.5 - 5.1 mEq/L 3.6 4.2 4.0  Chloride 96 - 112 mEq/L 105 100 100  CO2 19 - 32 mEq/L Calcium 8.4 - 10.5 mg/dL 9.0 5.7(Q) 4.6(N)    BNP    Component Value Date/Time   BNP 33.7 05/29/2019 0357    ProBNP No results found for: PROBNP  PFT No results found for: FEV1PRE, FEV1POST, FVCPRE, FVCPOST, TLC, DLCOUNC, PREFEV1FVCRT, PSTFEV1FVCRT  DG Chest 1 View  Result Date: 05/21/2019 CLINICAL DATA:  COVID-19 pneumonia, shortness of breath EXAM: CHEST  1 VIEW COMPARISON:  05/20/2019 FINDINGS: Single frontal view  of the chest demonstrates stable multifocal bilateral pneumonia. No effusion or pneumothorax. Cardiac silhouette is stable. IMPRESSION: 1. Stable bilateral multifocal pneumonia. Electronically Signed   By: Sharlet Salina M.D.   On: 05/21/2019 02:09   DG Chest 2 View  Result Date: 06/04/2019 CLINICAL DATA:  Dyspnea. EXAM: CHEST - 2 VIEW COMPARISON:  May 29, 2019. FINDINGS: The heart size and mediastinal contours are within normal limits. Stable left lung opacity is noted, although there is improved right lung opacity suggesting improving pneumonia. Elevated right hemidiaphragm is again noted. No definite pneumothorax or pleural effusion is noted. The visualized skeletal structures are unremarkable. IMPRESSION: Stable left lung opacity is noted, although there is improved right lung opacity suggesting improving pneumonia. Electronically Signed   By: Lupita Raider M.D.   On: 06/04/2019 14:54   DG Chest 2 View  Result Date: 05/29/2019 CLINICAL DATA:  Acute respiratory failure.  Hypoxia. EXAM: CHEST - 2 VIEW COMPARISON:  05/25/2019. FINDINGS: Heart size stable. Diffuse bilateral pulmonary infiltrates, most prominent on the right, again noted. Similar findings noted on prior exam. No pleural effusion or pneumothorax. IMPRESSION: Diffuse bilateral pulmonary infiltrates, most prominent right, again noted. Similar findings noted on prior exam. Electronically Signed   By: Maisie Fus  Register   On: 05/29/2019 08:40   DG Chest 2 View  Result Date: 05/25/2019 CLINICAL DATA:  COVID positive hypoxia, shortness of breath. EXAM: CHEST - 2 VIEW COMPARISON:  05/21/2019 FINDINGS: Lung volumes remain low. Cardiomediastinal contours are partially obscured due to diffuse interstitial and airspace disease in the RIGHT chest which has worsened since the previous exam. Opacities in the LEFT chest appear slightly more dense as well particularly in the retrocardiac region. Probable RIGHT effusion and small LEFT effusion. Visualized  skeletal structures are unremarkable. IMPRESSION: 1. More confluent appearance of disease in the RIGHT chest diffusely and in the LEFT chest particularly at LEFT lower lobe. 2. Small effusions bilaterally. Electronically Signed   By: Donzetta Kohut M.D.   On: 05/25/2019 11:46   DG Chest 2 View  Result Date: 05/20/2019 CLINICAL DATA:  Chest pain EXAM: CHEST - 2 VIEW COMPARISON:  05/09/2018 FINDINGS: Diffuse bilateral airspace disease, right greater than left again noted, unchanged. Low lung volumes. Heart is normal size. No visible effusions or pneumothorax. No acute bony abnormality. IMPRESSION: Severe diffuse bilateral airspace disease, right greater than left most compatible with pneumonia. No change. Electronically Signed   By: Charlett Nose M.D.   On: 05/20/2019 12:51   CT Angio Chest PE W/Cm &/Or Wo Cm  Result Date: 05/20/2019 CLINICAL DATA:  COVID-19 pneumonia, shortness of breath EXAM: CT ANGIOGRAPHY CHEST WITH CONTRAST TECHNIQUE: Multidetector CT imaging of the chest was performed using the standard protocol during bolus administration of intravenous contrast. Multiplanar CT image reconstructions and MIPs were obtained to evaluate the vascular anatomy. CONTRAST:  OMNIPAQUE IOHEXOL 350 MG/ML SOLN COMPARISON:  09/18/2016, 05/20/2019 FINDINGS: Cardiovascular: This is a technically adequate evaluation of the pulmonary vasculature. There are no filling defects or pulmonary emboli. Heart is unremarkable without pericardial effusion. Thoracic aorta is grossly normal without aneurysm or dissection. Mediastinum/Nodes: Mediastinal and right hilar adenopathy likely reactive. The thyroid, trachea, and esophagus are normal. Lungs/Pleura: There is multifocal bilateral airspace disease, right greater than  left, consistent with multifocal pneumonia. No effusion or pneumothorax. Central airways are patent. Upper Abdomen: No acute abnormality. Musculoskeletal: No acute or destructive bony lesions. Reconstructed  images demonstrate no additional findings. Review of the MIP images confirms the above findings. IMPRESSION: 1. No evidence of pulmonary embolus. 2. Multifocal bilateral pneumonia consistent with COVID-19. Electronically Signed   By: Sharlet Salina M.D.   On: 05/20/2019 20:59   ECHOCARDIOGRAM COMPLETE  Result Date: 05/21/2019    ECHOCARDIOGRAM REPORT   Patient Name:   Maria Bradford Date of Exam: 05/21/2019 Medical Rec #:  [id number]    Height:       66.0 in Accession #:    [id number]   Weight:       227.3 lb Date of Birth:  [birth year]    BSA:          2.112 m Patient Age:    39 years     BP:           141/99 mmHg Patient Gender: F            HR:           100 bpm. Exam Location:  Inpatient Procedure: 2D Echo, Cardiac Doppler and Color Doppler Indications:    CHF-Acute Systolic 428.21 / I50.21  History:        Patient has no prior history of Echocardiogram examinations.                 CAD; Risk Factors:Hypertension, Dyslipidemia and Non-Smoker.  Sonographer:    Renella Cunas RDCS Referring Phys: [phone] PREETHA JOSEPH IMPRESSIONS  1. Left ventricular ejection fraction, by estimation, is 65 to 70%. The left ventricle has normal function. The left ventricle has no regional wall motion abnormalities. Left ventricular diastolic parameters are consistent with Grade I diastolic dysfunction (impaired relaxation).  2. Right ventricular systolic function is normal. The right ventricular size is normal. There is normal pulmonary artery systolic pressure.  3. The mitral valve is normal in structure. Trivial mitral valve regurgitation. No evidence of mitral stenosis.  4. The aortic valve is normal in structure. Aortic valve regurgitation is not visualized. No aortic stenosis is present.  5. The inferior vena cava is normal in size with greater than 50% respiratory variability, suggesting right atrial pressure of 3 mmHg. FINDINGS  Left Ventricle: Left ventricular ejection fraction, by estimation, is 65 to 70%. The left ventricle has  normal function. The left ventricle has no regional wall motion abnormalities. The left ventricular internal cavity size was normal in size. There is  no left ventricular hypertrophy. Left ventricular diastolic parameters are consistent with Grade I diastolic dysfunction (impaired relaxation). Normal left ventricular filling pressure. Right Ventricle: The right ventricular size is normal. No increase in right ventricular wall thickness. Right ventricular systolic function is normal. There is normal pulmonary artery systolic pressure. Left Atrium: Left atrial size was normal in size. Right Atrium: Right atrial size was normal in size. Pericardium: There is no evidence of pericardial effusion. Mitral Valve: The mitral valve is normal in structure. Normal mobility of the mitral valve leaflets. Trivial mitral valve regurgitation. No evidence of mitral valve stenosis. Tricuspid Valve: The tricuspid valve is normal in structure. Tricuspid valve regurgitation is not demonstrated. No evidence of tricuspid stenosis. Aortic Valve: The aortic valve is normal in structure. Aortic valve regurgitation is not visualized. No aortic stenosis is present. Pulmonic Valve: The pulmonic valve was normal in structure. Pulmonic valve regurgitation is not visualized. No evidence of pulmonic stenosis.  Aorta: The aortic root is normal in size and structure. Venous: The inferior vena cava is normal in size with greater than 50% respiratory variability, suggesting right atrial pressure of 3 mmHg. IAS/Shunts: No atrial level shunt detected by color flow Doppler.  LEFT VENTRICLE PLAX 2D LVIDd:         3.89 cm     Diastology LVIDs:         2.70 cm     LV e' lateral:   9.46 cm/s LV PW:         0.91 cm     LV E/e' lateral: 6.6 LV IVS:        0.93 cm     LV e' medial:    5.87 cm/s LVOT diam:     1.70 cm     LV E/e' medial:  10.7 LV SV:         72 LV SV Index:   34 LVOT Area:     2.27 cm  LV Volumes (MOD) LV vol d, MOD A2C: 63.0 ml LV vol d, MOD A4C:  80.8 ml LV vol s, MOD A2C: 20.2 ml LV vol s, MOD A4C: 24.0 ml LV SV MOD A2C:     42.8 ml LV SV MOD A4C:     80.8 ml LV SV MOD BP:      48.4 ml RIGHT VENTRICLE TAPSE (M-mode): 1.6 cm LEFT ATRIUM             Index       RIGHT ATRIUM          Index LA diam:        3.60 cm 1.70 cm/m  RA Area:     7.63 cm LA Vol (A2C):   18.0 ml 8.52 ml/m  RA Volume:   13.60 ml 6.44 ml/m LA Vol (A4C):   21.8 ml 10.32 ml/m LA Biplane Vol: 21.1 ml 9.99 ml/m  AORTIC VALVE LVOT Vmax:   161.00 cm/s LVOT Vmean:  110.000 cm/s LVOT VTI:    0.319 m  AORTA Ao Root diam: 3.00 cm MITRAL VALVE MV Area (PHT): 4.29 cm    SHUNTS MV Decel Time: 177 msec    Systemic VTI:  0.32 m MV E velocity: 62.60 cm/s  Systemic Diam: 1.70 cm MV A velocity: 78.40 cm/s MV E/A ratio:  0.80 Tobias AlexanderKatarina Nelson MD Electronically signed by Tobias AlexanderKatarina Nelson MD Signature Date/Time: 05/21/2019/12:50:14 PM    Final      Past medical hx Past Medical History:  Diagnosis Date  . Coronary artery disease   . Hypertension      Social History   Tobacco Use  . Smoking status: Never Smoker  . Smokeless tobacco: Never Used  Substance Use Topics  . Alcohol use: No  . Drug use: No    Maria Bradford reports that she has never smoked. She has never used smokeless tobacco. She reports that she does not drink alcohol or use drugs.  Tobacco Cessation: Never smoker Past surgical hx, Family hx, Social hx all reviewed.  Current Outpatient Medications on File Prior to Visit  Medication Sig  . acetaminophen (TYLENOL) 500 MG tablet Take 1,000 mg by mouth every 6 (six) hours as needed for fever or headache (pain). For pain  . albuterol (VENTOLIN HFA) 108 (90 Base) MCG/ACT inhaler Inhale 2 puffs into the lungs every 6 (six) hours as needed. (Patient taking differently: Inhale 2 puffs into the lungs every 6 (six) hours as needed for wheezing or shortness of breath. )  .  aspirin EC 81 MG tablet Take 81 mg by mouth daily.  Marland Kitchen atorvastatin (LIPITOR) 80 MG tablet Take 1 tablet (80  mg total) by mouth daily.  . benzonatate (TESSALON) 200 MG capsule Take 1 capsule (200 mg total) by mouth 3 (three) times daily as needed for cough.  . carvedilol (COREG) 6.25 MG tablet Take 1 tablet (6.25 mg total) by mouth 2 (two) times daily.  . chlorpheniramine-HYDROcodone (TUSSIONEX) 10-8 MG/5ML SUER Take 5 mLs by mouth every 12 (twelve) hours as needed for cough.  . etonogestrel (NEXPLANON) 68 MG IMPL implant 1 each by Subdermal route continuous. Implanted 2020   . hydroxypropyl methylcellulose (ISOPTO TEARS) 2.5 % ophthalmic solution Place 1 drop into both eyes 4 (four) times daily as needed for dry eyes.  Marland Kitchen levofloxacin (LEVAQUIN) 500 MG tablet Take 1 tablet (500 mg total) by mouth daily for 7 days.  . Multiple Vitamins-Minerals (EMERGEN-C IMMUNE PLUS/VIT D) CHEW Chew 1 tablet by mouth 2 (two) times daily.  . predniSONE (DELTASONE) 10 MG tablet Take 3 tablets (30 mg total) by mouth daily with breakfast. Take 30 mg daily with breakfast for 7 days  . sertraline (ZOLOFT) 50 MG tablet Take 50 mg by mouth daily.   No current facility-administered medications on file prior to visit.     Allergies  Allergen Reactions  . Oxycodone-Acetaminophen Nausea And Vomiting  . Oxycodone Nausea And Vomiting    Review Of Systems:  Constitutional:   No  weight loss, night sweats,  Fevers, chills, fatigue, or  lassitude.  HEENT:   No headaches,  Difficulty swallowing,  Tooth/dental problems, or  Sore throat,                No sneezing, itching, ear ache, nasal congestion, post nasal drip,   CV:  No chest pain,  Orthopnea, PND, swelling in lower extremities, anasarca, dizziness, palpitations, syncope.   GI  No heartburn, indigestion, abdominal pain, nausea, vomiting, diarrhea, change in bowel habits, loss of appetite, bloody stools.   Resp: + shortness of breath with exertion only,  Not  at rest.  No excess mucus, no productive cough,  No non-productive cough,  No coughing up of blood.  No change in  color of mucus.  No wheezing.  No chest wall deformity  Skin: no rash or lesions.  GU: no dysuria, change in color of urine, no urgency or frequency.  No flank pain, no hematuria   MS:  No joint pain or swelling.  No decreased range of motion.  No back pain.  Psych:  No change in mood or affect. No depression or anxiety.  No memory loss.   Vital Signs BP 118/74 (BP Location: Left Arm, Cuff Size: Normal)   Pulse 92   Temp 98.3 F (36.8 C) (Oral)   Ht 5' 6.5" (1.689 m)   Wt 241 lb 4.8 oz (109.5 kg)   LMP  (LMP Unknown)   SpO2 96% Comment: RA  BMI 38.36 kg/m    Physical Exam:  General- No distress,  A&Ox3,. pleasant ENT: No sinus tenderness, TM clear, pale nasal mucosa, no oral exudate,no post nasal drip, no LAN Cardiac: S1, S2, regular rate and rhythm, no murmur Chest: No wheeze/ rales/ dullness; no accessory muscle use, no nasal flaring, no sternal retractions, few crackles per L base Abd.: Soft Non-tender, ND, BS +, Body mass index is 38.36 kg/m. Ext: No clubbing cyanosis, edema Neuro:  normal strength, MAE x 4, A&O x 3 Skin: No rashes, No lesions, warm  and dry Psych: normal mood and behavior   Assessment/Plan Hypoxic Respiratory Failure in setting of post Covid pneumonia ( Bilateral infiltrates.) Treated with ceftriaxone, azithromycin x7 days as inpatient Then additional 5 days of Levaquin as OP CXR 6/2 shows Minimally improved right and similar left-sided interstitial opacities, consistent with residual pneumonia or developing post infectious fibrosis. Clinically she is much improved Plan Wear oxygen as needed. Saturation goals are > 92% Use rescue inhaler as needed. Follow up in 2 weeks with CXR  If no further resolution of left lung opacity, will obtain a CT Chest Follow up with PCP    Vaginitis 2/2 antibiotic use Plan diflucan 150 mg once daily x 2 days You can also add daily probiotic tablets ( Culturelle) Please schedule appointment with  PCP. Follow up in 2 weeks       Bevelyn Ngo, NP 06/11/2019  4:19 PM

## 2019-06-12 ENCOUNTER — Encounter: Payer: Self-pay | Admitting: Acute Care

## 2019-06-12 NOTE — Progress Notes (Signed)
Please let patient know the CXR shows her pneumonia still  has not completely resolved in the left lung. I am encouraged by how much better she looks though. Tell her CXR sometimes lags behind clinical findings. Please make sure she is scheduled for a 2 week follow up with CXR. Thanks so much

## 2019-06-30 ENCOUNTER — Ambulatory Visit (INDEPENDENT_AMBULATORY_CARE_PROVIDER_SITE_OTHER): Payer: Medicaid Other

## 2019-06-30 ENCOUNTER — Ambulatory Visit (INDEPENDENT_AMBULATORY_CARE_PROVIDER_SITE_OTHER): Payer: Medicaid Other | Admitting: Primary Care

## 2019-06-30 ENCOUNTER — Other Ambulatory Visit: Payer: Self-pay

## 2019-06-30 ENCOUNTER — Encounter: Payer: Self-pay | Admitting: Primary Care

## 2019-06-30 VITALS — BP 122/80 | HR 102 | Temp 98.3°F | Ht 66.0 in | Wt 242.8 lb

## 2019-06-30 DIAGNOSIS — U071 COVID-19: Secondary | ICD-10-CM

## 2019-06-30 DIAGNOSIS — I2510 Atherosclerotic heart disease of native coronary artery without angina pectoris: Secondary | ICD-10-CM

## 2019-06-30 DIAGNOSIS — I10 Essential (primary) hypertension: Secondary | ICD-10-CM | POA: Diagnosis not present

## 2019-06-30 DIAGNOSIS — J1282 Pneumonia due to coronavirus disease 2019: Secondary | ICD-10-CM

## 2019-06-30 DIAGNOSIS — I5189 Other ill-defined heart diseases: Secondary | ICD-10-CM

## 2019-06-30 MED ORDER — FAMOTIDINE 20 MG PO TABS
20.0000 mg | ORAL_TABLET | Freq: Every day | ORAL | 2 refills | Status: AC
Start: 2019-06-30 — End: ?

## 2019-06-30 MED ORDER — BUDESONIDE-FORMOTEROL FUMARATE 80-4.5 MCG/ACT IN AERO
2.0000 | INHALATION_SPRAY | Freq: Two times a day (BID) | RESPIRATORY_TRACT | 3 refills | Status: DC
Start: 2019-06-30 — End: 2019-07-02

## 2019-06-30 NOTE — Progress Notes (Signed)
@Patient  ID: Maria Bradford, female    DOB: 1980/07/17, 40 y.o.   MRN: [id number]  Chief Complaint  Patient presents with  . Follow-up    pt is here for  for sob when up doing activities    Referring provider: Elisabeth Cara, *  HPI: 40 year old female, never smoked. PMH significant for HTN, CAD, NSTEMI, hyperlipidemia, pneumonia d/t COVID-19, acute respiratory failure. Seen by Pulmonary NP for hospital follow-up on 06/04/19, last seen on 06/30/19.   Previous [facility] pulmonary encounters: Maria Bradford is a 38 y.o. female  admitted 5/11 with reports of progressive shortness of breath. Hx of post-COVID 19 pneumonia. PE ruled out but imaging shows dense R>L infiltrate worrisome for post viral bacterial PNA. Increased O2 needs. She was seen as an inpatient by Dr. Elsworth Soho. 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 6/2 she has had improvement in her oxygen needs.  06/11/2019  Pt. Presents for follow up. She has completed 5 days of Levaquin, and she has been off her oxygen for the last 3 days. She has one more day of prednisone to take . Sats on RA today are 96% after ambulating back to the exam room. She does have occasional shortness of breath with exertion. She stops and sits when her body tells her to. She has used her rescue inhaler x 1 since last week. She denies any wheezing,she does have a cough, but it is better than it was. She is taking the robitussin cough syrup in the morning and at bedtime. The cough is productive for clear secretions. She has not had any fever or sweating. She does have a case of vaginitis from antibiotic use. We will treat with diflucan.She denies shortness of breath orthopnea or hemoptysis.   06/30/2019 - Interim hx She is feeling pretty well, she has some residual shortness of breath and cough. Associated chest tightness and states that her left side will  catch when coughing. CTA in May showed no evidence of PE. Multifocal pneumonia consistent with COVID-19. Repeat CXR showed resolving. She is using albuterol rescue inhaler once daily. Completed Levaquin course, develop vaginitis/yeast infection which resolved with diflucan. She did notice improvement with prednisone. As a child she had recurrent bronchitis and possible underlying asthma.   Test Results: CXR 06/11/2019 Moderate right hemidiaphragm elevation. No pleural effusion or pneumothorax. Left greater than right perihilar interstitial opacities again identified. Felt to be similar on the left and minimally improved on the right. No new pulmonary opacity.  Allergies  Allergen Reactions  . Oxycodone-Acetaminophen Nausea And Vomiting  . Oxycodone Nausea And Vomiting    Immunization History  Administered Date(s) Administered  . Influenza,inj,Quad PF,6+ Mos 10/07/2014, 10/10/2015, 11/01/2017, 10/08/2018  . Influenza-Unspecified 11/04/2011, 10/07/2014, 10/10/2015  . PPD Test 05/18/2014, 09/20/2018  . Tdap 03/06/2008    Past Medical History:  Diagnosis Date  . Coronary artery disease   . Hypertension     Tobacco History: Social History   Tobacco Use  Smoking Status Never Smoker  Smokeless Tobacco Never Used   Counseling given: Not Answered   Outpatient Medications Prior to Visit  Medication Sig Dispense Refill  . acetaminophen (TYLENOL) 500 MG tablet Take 1,000 mg by mouth every 6 (six) hours as needed for fever or headache (pain). For pain    . albuterol (VENTOLIN HFA) 108 (90 Base) MCG/ACT inhaler Inhale 2 puffs into the lungs every 6 (six) hours as needed. (Patient taking  differently: Inhale 2 puffs into the lungs every 6 (six) hours as needed for wheezing or shortness of breath. ) 6.7 g 0  . aspirin EC 81 MG tablet Take 81 mg by mouth daily.    Marland Kitchen atorvastatin (LIPITOR) 80 MG tablet Take 1 tablet (80 mg total) by mouth daily. 30 tablet 0  . etonogestrel (NEXPLANON) 68 MG  IMPL implant 1 each by Subdermal route continuous. Implanted 2020     . Multiple Vitamins-Minerals (EMERGEN-C IMMUNE PLUS/VIT D) CHEW Chew 1 tablet by mouth 2 (two) times daily.    . sertraline (ZOLOFT) 50 MG tablet Take 50 mg by mouth daily.    . hydroxypropyl methylcellulose (ISOPTO TEARS) 2.5 % ophthalmic solution Place 1 drop into both eyes 4 (four) times daily as needed for dry eyes. 15 mL 12  . carvedilol (COREG) 6.25 MG tablet Take 1 tablet (6.25 mg total) by mouth 2 (two) times daily. 60 tablet 0  . benzonatate (TESSALON) 200 MG capsule Take 1 capsule (200 mg total) by mouth 3 (three) times daily as needed for cough. 20 capsule 0  . chlorpheniramine-HYDROcodone (TUSSIONEX) 10-8 MG/5ML SUER Take 5 mLs by mouth every 12 (twelve) hours as needed for cough. 70 mL 0  . predniSONE (DELTASONE) 10 MG tablet Take 3 tablets (30 mg total) by mouth daily with breakfast. Take 30 mg daily with breakfast for 7 days 25 tablet 0   No facility-administered medications prior to visit.   Review of Systems  Review of Systems  Respiratory: Positive for cough, chest tightness and shortness of breath.   Cardiovascular: Negative for chest pain and leg swelling.   Physical Exam  BP 122/80 (BP Location: Left Arm, Cuff Size: Normal)   Pulse (!) 102   Temp 98.3 F (36.8 C) (Oral)   Ht 5\' 6"  (1.676 m)   Wt 242 lb 12.8 oz (110.1 kg)   LMP  (LMP Unknown)   SpO2 97%   BMI 39.19 kg/m  Physical Exam Constitutional:      Appearance: Normal appearance.  HENT:     Head: Normocephalic and atraumatic.     Mouth/Throat:     Mouth: Mucous membranes are moist.     Pharynx: Oropharynx is clear.  Cardiovascular:     Rate and Rhythm: Regular rhythm. Tachycardia present.     Comments: Regular rhythm. No LE edema Pulmonary:     Effort: Pulmonary effort is normal.     Breath sounds: No wheezing or rhonchi.     Comments: Lungs clear. Dyspneic at times when speaking. Upper airway wheezing.  Musculoskeletal:         General: Normal range of motion.  Neurological:     General: No focal deficit present.     Mental Status: She is alert and oriented to person, place, and time. Mental status is at baseline.  Psychiatric:        Mood and Affect: Mood normal.        Behavior: Behavior normal.        Thought Content: Thought content normal.        Judgment: Judgment normal.     Comments: Very pleasant, mildly anxious       Lab Results:  CBC    Component Value Date/Time   WBC 14.7 (H) 06/11/2019 1500   RBC 4.43 06/11/2019 1500   HGB 12.8 06/11/2019 1500   HCT 39.3 06/11/2019 1500   PLT 463.0 (H) 06/11/2019 1500   MCV 88.8 06/11/2019 1500   MCH 28.7 05/28/2019  0255   MCHC 32.5 06/11/2019 1500   RDW 14.5 06/11/2019 1500   LYMPHSABS 3.2 06/11/2019 1500   MONOABS 1.0 06/11/2019 1500   EOSABS 0.0 06/11/2019 1500   BASOSABS 0.1 06/11/2019 1500    BMET    Component Value Date/Time   NA 137 06/11/2019 1500   K 3.6 06/11/2019 1500   CL 105 06/11/2019 1500   CO2 26 06/11/2019 1500   GLUCOSE 236 (H) 06/11/2019 1500   BUN 16 06/11/2019 1500   CREATININE 1.49 (H) 06/11/2019 1500   CALCIUM 9.0 06/11/2019 1500   GFRNONAA 41 (L) 05/29/2019 0357   GFRAA 48 (L) 05/29/2019 0357    BNP    Component Value Date/Time   BNP 33.7 05/29/2019 0357    ProBNP No results found for: PROBNP  Imaging: DG Chest 2 View  Result Date: 06/30/2019 CLINICAL DATA:  39 year old female with COVID-19 pneumonia. Follow-up exam. EXAM: CHEST - 2 VIEW COMPARISON:  Chest radiograph dated 06/11/2019. FINDINGS: Interval improvement of the bilateral mid to lower lung field airspace disease compared to the prior radiograph. Residual densities primarily in the left lung noted. Continued follow-up recommended. No pleural effusion pneumothorax. The cardiac silhouette is within limits. No acute osseous pathology. IMPRESSION: Interval improvement of the bilateral mid to lower lung field airspace disease. Electronically Signed   By:  Elgie Collard M.D.   On: 06/30/2019 16:42   DG Chest 2 View  Result Date: 06/12/2019 CLINICAL DATA:  Shortness of breath.  COVID-19 pneumonia. EXAM: CHEST - 2 VIEW COMPARISON:  06/04/2019 FINDINGS: Patient rotated minimally right on the frontal. Midline trachea. Normal heart size and mediastinal contours. Moderate right hemidiaphragm elevation. No pleural effusion or pneumothorax. Left greater than right perihilar interstitial opacities again identified. Felt to be similar on the left and minimally improved on the right. No new pulmonary opacity. IMPRESSION: Minimally improved right and similar left-sided interstitial opacities, consistent with residual pneumonia or developing post infectious fibrosis. Electronically Signed   By: Jeronimo Greaves M.D.   On: 06/12/2019 10:31   DG Chest 2 View  Result Date: 06/04/2019 CLINICAL DATA:  Dyspnea. EXAM: CHEST - 2 VIEW COMPARISON:  May 29, 2019. FINDINGS: The heart size and mediastinal contours are within normal limits. Stable left lung opacity is noted, although there is improved right lung opacity suggesting improving pneumonia. Elevated right hemidiaphragm is again noted. No definite pneumothorax or pleural effusion is noted. The visualized skeletal structures are unremarkable. IMPRESSION: Stable left lung opacity is noted, although there is improved right lung opacity suggesting improving pneumonia. Electronically Signed   By: Lupita Raider M.D.   On: 06/04/2019 14:54     Assessment & Plan:   Pneumonia due to COVID-19 virus - Residual shortness of breath and cough. Associated chest tightness since dx COVID pneumonia. Reports improvement with oral steroids previously.  - Trial ICS/LABA, sent in prescription for Symbicort 80 one-two puffs twice daily (rinse mouth after use) - Continue Albuterol rescue inhaler 2 puffs every 6 hours for breakthrough shortness of breath or wheezing - CXR today showed Interval improvement of the bilateral mid to lower lung  field airspace disease - Needs PFTs in 4 weeks    Diastolic dysfunction - Recommend fu with cardiology d/t grade 1 diastolic dysfunction on recent echocardiogram in May 2021     Glenford Bayley, NP 07/03/2019

## 2019-06-30 NOTE — Patient Instructions (Addendum)
Recommendations: Trial Symbicort 80- take 1-2 puffs twice daily (rinse mouth after use) Continue Albuterol rescue inhaler 2 puffs every 6 hours for breakthrough shortness of breath or wheezing Continue Lipitor, Coreg and Lasix every other day as you have been doing   Orders: Repeat CXR today PFTs in 4 weeks   Follow-up: Recommend fu with cardiology d/t grade 1 diastolic dysfunction on recent echocardiogram in May  Return in 4 weeks with Kandice Robinsons or [facility] NP     Heart Failure, Diagnosis  Heart failure means that your heart is not able to pump blood in the right way. This makes it hard for your body to work well. Heart failure is usually a long-term (chronic) condition. You must take good care of yourself and follow your treatment plan from your doctor. What are the causes? This condition may be caused by:  High blood pressure.  Build up of cholesterol and fat in the arteries.  Heart attack. This injures the heart muscle.  Heart valves that do not open and close properly.  Damage of the heart muscle. This is also called cardiomyopathy.  Lung disease.  Abnormal heart rhythms. What increases the risk? The risk of heart failure goes up as a person ages. This condition is also more likely to develop in people who:  Are overweight.  Are female.  Smoke or chew tobacco.  Abuse alcohol or illegal drugs.  Have taken medicines that can damage the heart.  Have diabetes.  Have abnormal heart rhythms.  Have thyroid problems.  Have low blood counts (anemia). What are the signs or symptoms? Symptoms of this condition include:  Shortness of breath.  Coughing.  Swelling of the feet, ankles, legs, or belly.  Losing weight for no reason.  Trouble breathing.  Waking from sleep because of the need to sit up and get more air.  Rapid heartbeat.  Being very tired.  Feeling dizzy, or feeling like you may pass out (faint).  Having no desire to eat.  Feeling like you  may vomit (nauseous).  Peeing (urinating) more at night.  Feeling confused. How is this treated?  This condition may be treated with:  Medicines. These can be given to treat blood pressure and to make the heart muscles stronger.  Changes in your daily life. These may include eating a healthy diet, staying at a healthy body weight, quitting tobacco and illegal drug use, or doing exercises.  Surgery. Surgery can be done to open blocked valves, or to put devices in the heart, such as pacemakers.  A donor heart (heart transplant). You will receive a healthy heart from a donor. Follow these instructions at home:  Treat other conditions as told by your doctor. These may include high blood pressure, diabetes, thyroid disease, or abnormal heart rhythms.  Learn as much as you can about heart failure.  Get support as you need it.  Keep all follow-up visits as told by your doctor. This is important. Summary  Heart failure means that your heart is not able to pump blood in the right way.  This condition is caused by high blood pressure, heart attack, or damage of the heart muscle.  Symptoms of this condition include shortness of breath and swelling of the feet, ankles, legs, or belly. You may also feel very tired or feel like you may vomit.  You may be treated with medicines, surgery, or changes in your daily life.  Treat other health conditions as told by your doctor. This information is not intended  to replace advice given to you by your health care provider. Make sure you discuss any questions you have with your health care provider. Document Revised: 03/15/2018 Document Reviewed: 03/15/2018 Elsevier Patient Education  [employer].

## 2019-07-01 ENCOUNTER — Telehealth: Payer: Self-pay | Admitting: Primary Care

## 2019-07-01 NOTE — Telephone Encounter (Signed)
Ok, great. Can you discontinue Symbicort and send in prescription for Advair hfa 115 - . Take 1-2 puffs twice daily (rinse mouth after use)

## 2019-07-01 NOTE — Telephone Encounter (Signed)
Fax received stating that the budesonide-formoterol fumarate 80-4.6mcg inhaler is not covered by insurance.  Covered formulary alternatives may include:  Advair HFA 115-32mcg ([place] not required) Dulera 200-76mcg ([place] not required) Symbicort 160-4.14mcg (PA is required) Breo Ellipta 100-33mcg (PA is required) Airduo Digihaler 160mcg-14mcg (PA is required) Airduo Respiclick 55-70mcg (PA is required) Airduo Respiclick 232-32mcg (PA is required)  Beth, please advise.

## 2019-07-01 NOTE — Telephone Encounter (Signed)
ATC patient unable to reach please call again 07/02/19 No orders placed

## 2019-07-02 ENCOUNTER — Telehealth: Payer: Self-pay | Admitting: Primary Care

## 2019-07-02 MED ORDER — ADVAIR HFA 115-21 MCG/ACT IN AERO
2.0000 | INHALATION_SPRAY | Freq: Two times a day (BID) | RESPIRATORY_TRACT | 12 refills | Status: DC
Start: 2019-07-02 — End: 2019-12-15

## 2019-07-02 NOTE — Telephone Encounter (Signed)
Called and spoke with pt letting her know that we were switching her from Symbicort to Advair as Symbicort is not covered by insurance. Pt verbalized understanding. Rx for Advair sent to preferred pharmacy for pt. Nothing further needed.

## 2019-07-02 NOTE — Telephone Encounter (Signed)
Please see encounter from 6/22.

## 2019-07-03 DIAGNOSIS — I5189 Other ill-defined heart diseases: Secondary | ICD-10-CM | POA: Insufficient documentation

## 2019-07-03 NOTE — Assessment & Plan Note (Addendum)
-   Residual shortness of breath and cough. Associated chest tightness since dx COVID pneumonia. Reports improvement with oral steroids previously.  - Trial ICS/LABA, sent in prescription for Symbicort 80 one-two puffs twice daily (rinse mouth after use) - Continue Albuterol rescue inhaler 2 puffs every 6 hours for breakthrough shortness of breath or wheezing - CXR today showed Interval improvement of the bilateral mid to lower lung field airspace disease - Needs PFTs in 4 weeks

## 2019-07-03 NOTE — Assessment & Plan Note (Signed)
-   Recommend fu with cardiology d/t grade 1 diastolic dysfunction on recent echocardiogram in May 2021

## 2019-07-07 ENCOUNTER — Telehealth: Payer: Self-pay | Admitting: Primary Care

## 2019-07-07 NOTE — Telephone Encounter (Signed)
Yes she is ok to return to work. Take delsym for cough. Continue Advair (rinse mouth after use)

## 2019-07-07 NOTE — Telephone Encounter (Signed)
Spoke with pt, aware of recs.  Pt states she does not need a letter to return to work at this time.  Also states she does not need an Advair refill at this time.  Nothing further needed at this time- will close encounter.

## 2019-07-07 NOTE — Telephone Encounter (Signed)
Patient is feeling better and needs to go back to some sort of work. She is off oxygen and taking Advair. She reports an infrequent cough. She does not need a letter or forms she just wants your advise. She needs the money.

## 2019-07-21 ENCOUNTER — Telehealth: Payer: Self-pay | Admitting: Acute Care

## 2019-07-22 NOTE — Telephone Encounter (Signed)
Records faxed to  [phone] [facility] for the patient making her aware

## 2019-07-29 ENCOUNTER — Telehealth: Payer: Self-pay | Admitting: Primary Care

## 2019-07-29 ENCOUNTER — Encounter: Payer: Self-pay | Admitting: Primary Care

## 2019-07-31 NOTE — Telephone Encounter (Signed)
Error

## 2019-08-08 ENCOUNTER — Other Ambulatory Visit (HOSPITAL_COMMUNITY): Payer: Medicaid Other

## 2019-08-11 ENCOUNTER — Ambulatory Visit: Payer: Medicaid Other | Admitting: Primary Care

## 2019-08-23 ENCOUNTER — Other Ambulatory Visit (HOSPITAL_COMMUNITY)
Admission: RE | Admit: 2019-08-23 | Discharge: 2019-08-23 | Disposition: A | Discharge status: Home / Self Care | Payer: Medicaid Other | Source: Ambulatory Visit | Attending: Primary Care | Admitting: Primary Care

## 2019-08-23 DIAGNOSIS — Z01812 Encounter for preprocedural laboratory examination: Secondary | ICD-10-CM | POA: Diagnosis present

## 2019-08-23 DIAGNOSIS — Z20822 Contact with and (suspected) exposure to covid-19: Secondary | ICD-10-CM | POA: Diagnosis not present

## 2019-08-23 LAB — SARS CORONAVIRUS 2 (TAT 6-24 HRS): SARS Coronavirus 2: NEGATIVE

## 2019-08-25 NOTE — Progress Notes (Addendum)
@Patient  ID: , female    DOB: 1980-04-20, 39 y.o.   MRN: [id number]  Chief Complaint  Patient presents with  . Follow-up    Referring provider: [id number], *  HPI: 39 year old female, never smoked. PMH significant for HTN, CAD, NSTEMI, hyperlipidemia, pneumonia d/t COVID-19, acute respiratory failure. Seen by Pulmonary NP for hospital follow-up on 06/04/19, last seen on 06/30/19.   Previous [facility] pulmonary encounters: Maria Bradford is a 39 y.o. female  admitted 5/11 with reports of progressive shortness of breath. Hx of post-COVID 19 pneumonia. PE ruled out but imaging shows dense R>L infiltrate worrisome for post viral bacterial PNA. Increased O2 needs. She was seen as an inpatient by Dr. 7/11. 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 6/2 she has had improvement in her oxygen needs.  06/11/2019  Pt. Presents for follow up. She has completed 5 days of Levaquin, and she has been off her oxygen for the last 3 days. She has one more day of prednisone to take . Sats on RA today are 96% after ambulating back to the exam room. She does have occasional shortness of breath with exertion. She stops and sits when her body tells her to. She has used her rescue inhaler x 1 since last week. She denies any wheezing,she does have a cough, but it is better than it was. She is taking the robitussin cough syrup in the morning and at bedtime. The cough is productive for clear secretions. She has not had any fever or sweating. She does have a case of vaginitis from antibiotic use. We will treat with diflucan.She denies shortness of breath orthopnea or hemoptysis.  06/30/2019 She is feeling pretty well, she has some residual shortness of breath and cough. Associated chest tightness and states that her left side will catch when coughing. CTA in May showed no evidence of PE. Multifocal  pneumonia consistent with COVID-19. Repeat CXR showed resolving. She is using albuterol rescue inhaler once daily. Completed Levaquin course, develop vaginitis/yeast infection which resolved with diflucan. She did notice improvement with prednisone. As a child she had recurrent bronchitis and possible underlying asthma.   08/26/2019- Interim hx Patient presents today for 6 week follow-up. Accompanied by female friend. She had residual shortness of breath, cough and chest tightness since covid-19 pneumonia. Improved with oral steroids. Trial ICS/LABA inhaler, sent in prescription for Symbicort 80. CXR showed interval improvement of bilateral mid to lower lung field airspace disease. Recommending PFTs in 4 weeks. Referred to cardiology for grade 1 diastolic dysfunction on recent echocardiogram   Feels improvement in shortness of breath since being on Advair. She is still using her rescue inhaler several times a week but not every day. She feels cough is side effect from lisinopril. She is not currently requiring oxygen but uses it as needed. She has some post nasal drip symptoms, she has not tried taking antihistamine or nasal spray. Female friend smells of marijuana, patient does not smoke. Advised he not smoke near patient.    Test Results: CXR 06/11/2019 Moderate right hemidiaphragm elevation. No pleural effusion or pneumothorax. Left greater than right perihilar interstitial opacities again identified. Felt to be similar on the left and minimally improved on the right. No new pulmonary opacity.  Pulmonary function testing: 08/26/2019 FVC 3.32 (95%), FEV1 2.85 (100%), ratio 86 Interpretation: Normal spirometry and diffusion capacity. No BD response.  Allergies  Allergen Reactions  .  Oxycodone-Acetaminophen Nausea And Vomiting  . Oxycodone Nausea And Vomiting    Immunization History  Administered Date(s) Administered  . Influenza,inj,Quad PF,6+ Mos 10/07/2014, 10/10/2015, 11/01/2017, 10/08/2018    . Influenza-Unspecified 11/04/2011, 10/07/2014, 10/10/2015  . PPD Test 05/18/2014, 09/20/2018  . Tdap 03/06/2008    Past Medical History:  Diagnosis Date  . Coronary artery disease   . Hypertension     Tobacco History: Social History   Tobacco Use  Smoking Status Never Smoker  Smokeless Tobacco Never Used   Counseling given: Not Answered   Outpatient Medications Prior to Visit  Medication Sig Dispense Refill  . acetaminophen (TYLENOL) 500 MG tablet Take 1,000 mg by mouth every 6 (six) hours as needed for fever or headache (pain). For pain    . albuterol (VENTOLIN HFA) 108 (90 Base) MCG/ACT inhaler Inhale 2 puffs into the lungs every 6 (six) hours as needed. (Patient taking differently: Inhale 2 puffs into the lungs every 6 (six) hours as needed for wheezing or shortness of breath. ) 6.7 g 0  . aspirin EC 81 MG tablet Take 81 mg by mouth daily.    Marland Kitchen atorvastatin (LIPITOR) 80 MG tablet Take 1 tablet (80 mg total) by mouth daily. 30 tablet 0  . etonogestrel (NEXPLANON) 68 MG IMPL implant 1 each by Subdermal route continuous. Implanted 2020     . famotidine (PEPCID) 20 MG tablet Take 1 tablet (20 mg total) by mouth daily. 30 tablet 2  . fluticasone-salmeterol (ADVAIR HFA) 115-21 MCG/ACT inhaler Inhale 2 puffs into the lungs 2 (two) times daily. 1 Inhaler 12  . Multiple Vitamins-Minerals (EMERGEN-C IMMUNE PLUS/VIT D) CHEW Chew 1 tablet by mouth 2 (two) times daily.    . sertraline (ZOLOFT) 50 MG tablet Take 50 mg by mouth daily.    . carvedilol (COREG) 6.25 MG tablet Take 1 tablet (6.25 mg total) by mouth 2 (two) times daily. 60 tablet 0   No facility-administered medications prior to visit.    Review of Systems  Review of Systems  Constitutional: Negative.   Respiratory: Positive for cough. Negative for chest tightness, shortness of breath and wheezing.     Physical Exam  BP 128/74 (BP Location: Left Arm, Cuff Size: Normal)   Pulse 91   Temp (!) 97.4 F (36.3 C) (Oral)    Ht 5' 7.5" (1.715 m)   Wt 245 lb (111.1 kg)   SpO2 97%   BMI 37.81 kg/m  Physical Exam Constitutional:      Appearance: Normal appearance.  HENT:     Head: Normocephalic and atraumatic.  Cardiovascular:     Rate and Rhythm: Normal rate and regular rhythm.  Pulmonary:     Effort: Pulmonary effort is normal.     Breath sounds: Normal breath sounds.     Comments: Upper airway cough/throat clearing Skin:    General: Skin is warm and dry.  Neurological:     Mental Status: She is alert.  Psychiatric:        Mood and Affect: Mood normal.        Behavior: Behavior normal.        Thought Content: Thought content normal.        Judgment: Judgment normal.      Lab Results:  CBC    Component Value Date/Time   WBC 14.7 (H) 06/11/2019 1500   RBC 4.43 06/11/2019 1500   HGB 12.8 06/11/2019 1500   HCT 39.3 06/11/2019 1500   PLT 463.0 (H) 06/11/2019 1500   MCV 88.8  06/11/2019 1500   MCH 28.7 05/28/2019 0255   MCHC 32.5 06/11/2019 1500   RDW 14.5 06/11/2019 1500   LYMPHSABS 3.2 06/11/2019 1500   MONOABS 1.0 06/11/2019 1500   EOSABS 0.0 06/11/2019 1500   BASOSABS 0.1 06/11/2019 1500    BMET    Component Value Date/Time   NA 137 06/11/2019 1500   K 3.6 06/11/2019 1500   CL 105 06/11/2019 1500   CO2 26 06/11/2019 1500   GLUCOSE 236 (H) 06/11/2019 1500   BUN 16 06/11/2019 1500   CREATININE 1.49 (H) 06/11/2019 1500   CALCIUM 9.0 06/11/2019 1500   GFRNONAA 41 (L) 05/29/2019 0357   GFRAA 48 (L) 05/29/2019 0357    BNP    Component Value Date/Time   BNP 33.7 05/29/2019 0357    ProBNP No results found for: PROBNP  Imaging: No results found.   Assessment & Plan:   Pneumonia due to COVID-19 virus - Dx Covid-19 pneumonia in May 2021. Since then she has had residual shortness of breath, improved with low dose ICS/LABA. PFTs today showed normal spirometry and DLCO. No BD response. Not requiring daily oxygen, uses as needed. Plan taper Advair hfa to 1 puff twice daily  and eventually trial off if continues to do well  - CXR in June 2021 showed bilateral mid to lower lung field airspace disease compared to the prior radiograph. Residual densities primarily in the left lung noted. Recommended following until resolved - Order for repeat CXR today  - FU in 3-6 months and then as needed basis with pulmonary    Glenford Bayley, NP 08/26/2019

## 2019-08-26 ENCOUNTER — Encounter: Payer: Self-pay | Admitting: Primary Care

## 2019-08-26 ENCOUNTER — Ambulatory Visit (INDEPENDENT_AMBULATORY_CARE_PROVIDER_SITE_OTHER): Payer: Medicaid Other | Admitting: Pulmonary Disease

## 2019-08-26 ENCOUNTER — Ambulatory Visit: Payer: Medicaid Other | Admitting: Primary Care

## 2019-08-26 ENCOUNTER — Other Ambulatory Visit: Payer: Self-pay

## 2019-08-26 DIAGNOSIS — J1282 Pneumonia due to coronavirus disease 2019: Secondary | ICD-10-CM

## 2019-08-26 DIAGNOSIS — U071 COVID-19: Secondary | ICD-10-CM | POA: Diagnosis not present

## 2019-08-26 LAB — PULMONARY FUNCTION TEST
DL/VA % pred: 105 %
DL/VA: 4.54 ml/min/mmHg/L
DLCO cor % pred: 85 %
DLCO cor: 20.81 ml/min/mmHg
DLCO unc % pred: 85 %
DLCO unc: 20.81 ml/min/mmHg
FEF 25-75 Post: 3.29 L/sec
FEF 25-75 Pre: 3.6 L/sec
FEF2575-%Change-Post: -8 %
FEF2575-%Pred-Post: 107 %
FEF2575-%Pred-Pre: 118 %
FEV1-%Change-Post: 0 %
FEV1-%Pred-Post: 100 %
FEV1-%Pred-Pre: 100 %
FEV1-Post: 2.85 L
FEV1-Pre: 2.85 L
FEV1FVC-%Change-Post: 0 %
FEV1FVC-%Pred-Pre: 102 %
FEV6-%Change-Post: 0 %
FEV6-%Pred-Post: 97 %
FEV6-%Pred-Pre: 98 %
FEV6-Post: 3.32 L
FEV6-Pre: 3.35 L
FEV6FVC-%Pred-Post: 101 %
FEV6FVC-%Pred-Pre: 101 %
FVC-%Change-Post: 0 %
FVC-%Pred-Post: 95 %
FVC-%Pred-Pre: 96 %
FVC-Post: 3.32 L
FVC-Pre: 3.35 L
Post FEV1/FVC ratio: 86 %
Post FEV6/FVC ratio: 100 %
Pre FEV1/FVC ratio: 85 %
Pre FEV6/FVC Ratio: 100 %
RV % pred: 119 %
RV: 2.12 L
TLC % pred: 106 %
TLC: 5.96 L

## 2019-08-26 NOTE — Patient Instructions (Addendum)
Pulmonary function testing looked normal today  Recommendations: Taper Advair to 1 puff twice daily  Continue Albuterol 2 puffs every 4-6 hours as needed for shortness of breath/wheezing Start Claritin OR zyrtec once daily (over the counter) Start flonase nasal spray once daily (over the counter)  Orders: Recommend repeat CXR today- last image showed interval improvement but you had some residual opacities in lower lungs. Radiologist recommended following until resolved  Follow-up: 3-6 months with [facility] NP or sooner if needed

## 2019-08-26 NOTE — Progress Notes (Signed)
Full PFT performed today. °

## 2019-08-26 NOTE — Assessment & Plan Note (Addendum)
-   Dx Covid-19 pneumonia in May 2021. Since then she has had residual shortness of breath, improved with low dose ICS/LABA. PFTs today showed normal spirometry and DLCO. No BD response. Not requiring daily oxygen, uses as needed. Plan taper Advair hfa to 1 puff twice daily and eventually trial off if continues to do well  - CXR in June 2021 showed bilateral mid to lower lung field airspace disease compared to the prior radiograph. Residual densities primarily in the left lung noted. Recommended following until resolved - Order for repeat CXR today  - FU in 3-6 months and then as needed basis with pulmonary

## 2019-10-17 ENCOUNTER — Telehealth: Payer: Self-pay | Admitting: Primary Care

## 2019-10-17 NOTE — Telephone Encounter (Signed)
Spoke with the pt  She is requesting a letter to return to work effective today  She states letter can be uploaded to her mychart  Please advise, thanks!

## 2019-10-17 NOTE — Telephone Encounter (Signed)
Spoke with patient. She is aware that Waynetta Sandy has approved the letter. I did advise her that the letter in MyChart would not be signed. She will have her daughter to stop by the office today to pick up a signed copy. Will place this up front.   Nothing further needed at time of call.

## 2019-10-17 NOTE — Telephone Encounter (Signed)
Yes she can return to work, I will sign letter

## 2019-11-25 NOTE — Progress Notes (Deleted)
@Patient  ID: , female    DOB: February 14, 1980, 39 y.o.   MRN: [id number]  No chief complaint on file.   Referring provider: [id number], *  HPI: 39 year old female, never smoked. PMH significant for HTN, CAD, NSTEMI, hyperlipidemia, pneumonia d/t COVID-19, acute respiratory failure. Seen by Pulmonary NP for hospital follow-up on 06/04/19, patient was consulted during hospital stay by Dr. 06/06/19.   Previous [facility] pulmonary encounters: Asheton Viramontes is a 39 y.o. female  admitted 5/11 with reports of progressive shortness of breath. Hx of post-COVID 19 pneumonia. PE ruled out but imaging shows dense R>L infiltrate worrisome for post viral bacterial PNA. Increased O2 needs. She was seen as an inpatient by Dr. 7/11. 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 6/2 she has had improvement in her oxygen needs.  06/11/2019  Pt. Presents for follow up. She has completed 5 days of Levaquin, and she has been off her oxygen for the last 3 days. She has one more day of prednisone to take . Sats on RA today are 96% after ambulating back to the exam room. She does have occasional shortness of breath with exertion. She stops and sits when her body tells her to. She has used her rescue inhaler x 1 since last week. She denies any wheezing,she does have a cough, but it is better than it was. She is taking the robitussin cough syrup in the morning and at bedtime. The cough is productive for clear secretions. She has not had any fever or sweating. She does have a case of vaginitis from antibiotic use. We will treat with diflucan.She denies shortness of breath orthopnea or hemoptysis.  06/30/2019 She is feeling pretty well, she has some residual shortness of breath and cough. Associated chest tightness and states that her left side will catch when coughing. CTA in May showed no evidence of PE.  Multifocal pneumonia consistent with COVID-19. Repeat CXR showed resolving. She is using albuterol rescue inhaler once daily. Completed Levaquin course, develop vaginitis/yeast infection which resolved with diflucan. She did notice improvement with prednisone. As a child she had recurrent bronchitis and possible underlying asthma.   08/26/2019 Patient presents today for 6 week follow-up. Accompanied by female friend. She had residual shortness of breath, cough and chest tightness since covid-19 pneumonia. Improved with oral steroids. Trial ICS/LABA inhaler, sent in prescription for Symbicort 80. CXR showed interval improvement of bilateral mid to lower lung field airspace disease. Recommending PFTs in 4 weeks. Referred to cardiology for grade 1 diastolic dysfunction on recent echocardiogram   Feels improvement in shortness of breath since being on Advair. She is still using her rescue inhaler several times a week but not every day. She feels cough is side effect from lisinopril. She is not currently requiring oxygen but uses it as needed. She has some post nasal drip symptoms, she has not tried taking antihistamine or nasal spray. Female friend smells of marijuana, patient does not smoke. Advised he not smoke near patient.    11/26/2019- Interim hx Patient presents today for 3 month follow-up. Dx -19 pneumonia in May 2021. Since then she has had residual shortness of breath which did improve with low dose ICS/LABA. She is not requiring oxygen. During last pulmonary function testing appered normal. Recommended tapering Advair. Advised starting otc antihistamine and flonase nasal spray. CXR on 06/30/19 showed interval improvement of bilateral mid-lower lung field airspace disease.  Test Results: CXR 06/11/2019 Moderate right hemidiaphragm elevation. No pleural effusion or pneumothorax. Left greater than right perihilar interstitial opacities again identified. Felt to be similar on the left and minimally  improved on the right. No new pulmonary opacity.  Pulmonary function testing: 08/26/2019 FVC 3.32 (95%), FEV1 2.85 (100%), ratio 86 Interpretation: Normal spirometry and diffusion capacity. No BD response.  Allergies  Allergen Reactions  . Oxycodone-Acetaminophen Nausea And Vomiting  . Oxycodone Nausea And Vomiting    Immunization History  Administered Date(s) Administered  . Influenza,inj,Quad PF,6+ Mos 10/07/2014, 10/10/2015, 11/01/2017, 10/08/2018  . Influenza-Unspecified 11/04/2011, 10/07/2014, 10/10/2015  . PPD Test 05/18/2014, 09/20/2018  . Tdap 03/06/2008    Past Medical History:  Diagnosis Date  . Coronary artery disease   . Hypertension     Tobacco History: Social History   Tobacco Use  Smoking Status Never Smoker  Smokeless Tobacco Never Used   Counseling given: Not Answered   Outpatient Medications Prior to Visit  Medication Sig Dispense Refill  . acetaminophen (TYLENOL) 500 MG tablet Take 1,000 mg by mouth every 6 (six) hours as needed for fever or headache (pain). For pain    . albuterol (VENTOLIN HFA) 108 (90 Base) MCG/ACT inhaler Inhale 2 puffs into the lungs every 6 (six) hours as needed. (Patient taking differently: Inhale 2 puffs into the lungs every 6 (six) hours as needed for wheezing or shortness of breath. ) 6.7 g 0  . aspirin EC 81 MG tablet Take 81 mg by mouth daily.    Marland Kitchen atorvastatin (LIPITOR) 80 MG tablet Take 1 tablet (80 mg total) by mouth daily. 30 tablet 0  . carvedilol (COREG) 6.25 MG tablet Take 1 tablet (6.25 mg total) by mouth 2 (two) times daily. 60 tablet 0  . etonogestrel (NEXPLANON) 68 MG IMPL implant 1 each by Subdermal route continuous. Implanted 2020     . famotidine (PEPCID) 20 MG tablet Take 1 tablet (20 mg total) by mouth daily. 30 tablet 2  . fluticasone-salmeterol (ADVAIR HFA) 115-21 MCG/ACT inhaler Inhale 2 puffs into the lungs 2 (two) times daily. 1 Inhaler 12  . Multiple Vitamins-Minerals (EMERGEN-C IMMUNE PLUS/VIT D) CHEW  Chew 1 tablet by mouth 2 (two) times daily.    . sertraline (ZOLOFT) 50 MG tablet Take 50 mg by mouth daily.     No facility-administered medications prior to visit.      Review of Systems  Review of Systems   Physical Exam  There were no vitals taken for this visit. Physical Exam   Lab Results:  CBC    Component Value Date/Time   WBC 14.7 (H) 06/11/2019 1500   RBC 4.43 06/11/2019 1500   HGB 12.8 06/11/2019 1500   HCT 39.3 06/11/2019 1500   PLT 463.0 (H) 06/11/2019 1500   MCV 88.8 06/11/2019 1500   MCH 28.7 05/28/2019 0255   MCHC 32.5 06/11/2019 1500   RDW 14.5 06/11/2019 1500   LYMPHSABS 3.2 06/11/2019 1500   MONOABS 1.0 06/11/2019 1500   EOSABS 0.0 06/11/2019 1500   BASOSABS 0.1 06/11/2019 1500    BMET    Component Value Date/Time   NA 137 06/11/2019 1500   K 3.6 06/11/2019 1500   CL 105 06/11/2019 1500   CO2 26 06/11/2019 1500   GLUCOSE 236 (H) 06/11/2019 1500   BUN 16 06/11/2019 1500   CREATININE 1.49 (H) 06/11/2019 1500   CALCIUM 9.0 06/11/2019 1500   GFRNONAA 41 (L) 05/29/2019 0357   GFRAA 48 (L) 05/29/2019 0357  BNP    Component Value Date/Time   BNP 33.7 05/29/2019 0357    ProBNP No results found for: PROBNP  Imaging: No results found.   Assessment & Plan:   No problem-specific Assessment & Plan notes found for this encounter.     Glenford Bayley, NP 11/25/2019

## 2019-11-26 ENCOUNTER — Ambulatory Visit: Payer: Medicaid Other | Admitting: Primary Care

## 2019-12-12 ENCOUNTER — Ambulatory Visit: Payer: Medicaid Other | Admitting: Primary Care

## 2019-12-12 NOTE — Progress Notes (Signed)
@Patient  ID: , female    DOB: 02/23/1980, 39 y.o.   MRN: [id number]  Chief Complaint  Patient presents with  . Follow-up    Still has some SOB - comes and goes - mostly with exertion - Occas dry cough    Referring provider: [id number], *  HPI: 39 year old female, never smoked. PMH significant for HTN, CAD, NSTEMI, hyperlipidemia, pneumonia d/t COVID-19, acute respiratory failure. Seen by Pulmonary NP for hospital follow-up on 06/04/19, patient was consulted during hospital stay by Dr. 06/06/19.   Previous [facility] pulmonary encounters: Joann Jorge is a 39 y.o. female  admitted 5/11 with reports of progressive shortness of breath. Hx of post-COVID 19 pneumonia. PE ruled out but imaging shows dense R>L infiltrate worrisome for post viral bacterial PNA. Increased O2 needs. She was seen as an inpatient by Dr. 7/11. 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 6/2 she has had improvement in her oxygen needs.  06/11/2019  Pt. Presents for follow up. She has completed 5 days of Levaquin, and she has been off her oxygen for the last 3 days. She has one more day of prednisone to take . Sats on RA today are 96% after ambulating back to the exam room. She does have occasional shortness of breath with exertion. She stops and sits when her body tells her to. She has used her rescue inhaler x 1 since last week. She denies any wheezing,she does have a cough, but it is better than it was. She is taking the robitussin cough syrup in the morning and at bedtime. The cough is productive for clear secretions. She has not had any fever or sweating. She does have a case of vaginitis from antibiotic use. We will treat with diflucan.She denies shortness of breath orthopnea or hemoptysis.  06/30/2019 She is feeling pretty well, she has some residual shortness of breath and cough. Associated  chest tightness and states that her left side will catch when coughing. CTA in May showed no evidence of PE. Multifocal pneumonia consistent with COVID-19. Repeat CXR showed resolving. She is using albuterol rescue inhaler once daily. Completed Levaquin course, develop vaginitis/yeast infection which resolved with diflucan. She did notice improvement with prednisone. As a child she had recurrent bronchitis and possible underlying asthma.   08/26/2019 Patient presents today for 6 week follow-up. Accompanied by female friend. She had residual shortness of breath, cough and chest tightness since covid-19 pneumonia. Improved with oral steroids. Trial ICS/LABA inhaler, sent in prescription for Symbicort 80. CXR showed interval improvement of bilateral mid to lower lung field airspace disease. Recommending PFTs in 4 weeks. Referred to cardiology for grade 1 diastolic dysfunction on recent echocardiogram   Feels improvement in shortness of breath since being on Advair. She is still using her rescue inhaler several times a week but not every day. She feels cough is side effect from lisinopril. She is not currently requiring oxygen but uses it as needed. She has some post nasal drip symptoms, she has not tried taking antihistamine or nasal spray. Female friend smells of marijuana, patient does not smoke. Advised he not smoke near patient.    12/15/2019 - INTERIM HX Patient presents today for 3-4 month follow-up. She cancelled apt in November. Dx -19 pneumonia in May 2021. Since Covid she has had residual shortness of breath which did improve with low dose ICS/LABA. She is not requiring oxygen. During last pulmonary  function testing appered normal. Recommended tapering Advair HFA to 1 puff daily. Advised starting otc antihistamine and flonase nasal spray. Repeat CXR on 06/30/19 showed interval improvement of bilateral mid to lower lung field airspace disease. Recommend continued follow-up.  She went to [facility] on Thursday  12/11/19 for URI, reported increased shortness of breath x 3 days with cough. She tell me that she had a lot of allergic rhinitis symptoms when she in [place] for the last month. Reports post nasal drip, dry cough, sneezing and watery eyes. She was unsure what she could take over the counter for this. Shortness of breath mostly with moderate exertion. Her breathing has been stable since tapering Advair. Sleep apnea runs in her family, she has never been evaluated.    Test Results: CXR 06/11/2019 Moderate right hemidiaphragm elevation. No pleural effusion or pneumothorax. Left greater than right perihilar interstitial opacities again identified. Felt to be similar on the left and minimally improved on the right. No new pulmonary opacity.  Pulmonary function testing: 08/26/2019 FVC 3.32 (95%), FEV1 2.85 (100%), ratio 86 Interpretation: Normal spirometry and diffusion capacity. No BD response.  Allergies  Allergen Reactions  . Oxycodone-Acetaminophen Nausea And Vomiting  . Oxycodone Nausea And Vomiting    Immunization History  Administered Date(s) Administered  . Influenza,inj,Quad PF,6+ Mos 10/07/2014, 10/10/2015, 11/01/2017, 10/08/2018  . Influenza-Unspecified 11/04/2011, 10/07/2014, 10/10/2015  . PPD Test 05/18/2014, 09/20/2018  . Tdap 03/06/2008    Past Medical History:  Diagnosis Date  . Coronary artery disease   . Hypertension     Tobacco History: Social History   Tobacco Use  Smoking Status Never Smoker  Smokeless Tobacco Never Used   Counseling given: Not Answered   Outpatient Medications Prior to Visit  Medication Sig Dispense Refill  . acetaminophen (TYLENOL) 500 MG tablet Take 1,000 mg by mouth every 6 (six) hours as needed for fever or headache (pain). For pain    . aspirin EC 81 MG tablet Take 81 mg by mouth daily.    Marland Kitchen atorvastatin (LIPITOR) 80 MG tablet Take 1 tablet (80 mg total) by mouth daily. 30 tablet 0  . etonogestrel (NEXPLANON) 68 MG IMPL implant 1  each by Subdermal route continuous. Implanted 2020     . famotidine (PEPCID) 20 MG tablet Take 1 tablet (20 mg total) by mouth daily. 30 tablet 2  . Multiple Vitamins-Minerals (EMERGEN-C IMMUNE PLUS/VIT D) CHEW Chew 1 tablet by mouth 2 (two) times daily.    . sertraline (ZOLOFT) 50 MG tablet Take 50 mg by mouth daily.    Marland Kitchen albuterol (VENTOLIN HFA) 108 (90 Base) MCG/ACT inhaler Inhale 2 puffs into the lungs every 6 (six) hours as needed. (Patient taking differently: Inhale 2 puffs into the lungs every 6 (six) hours as needed for wheezing or shortness of breath. ) 6.7 g 0  . fluticasone-salmeterol (ADVAIR HFA) 115-21 MCG/ACT inhaler Inhale 2 puffs into the lungs 2 (two) times daily. 1 Inhaler 12  . carvedilol (COREG) 6.25 MG tablet Take 1 tablet (6.25 mg total) by mouth 2 (two) times daily. 60 tablet 0   No facility-administered medications prior to visit.   Review of Systems  Review of Systems  Constitutional: Negative.   HENT: Positive for postnasal drip.   Respiratory: Positive for cough and shortness of breath.    Physical Exam  BP 126/80   Pulse 91   Temp 97.7 F (36.5 C) (Oral)   Ht 5' 6.5" (1.689 m)   Wt 247 lb 12.8 oz (112.4 kg)  SpO2 100%   BMI 39.40 kg/m  Physical Exam Constitutional:      Appearance: Normal appearance.  HENT:     Head: Normocephalic and atraumatic.     Mouth/Throat:     Mouth: Mucous membranes are moist.     Pharynx: Oropharynx is clear. Uvula midline. No pharyngeal swelling, oropharyngeal exudate or posterior oropharyngeal erythema.     Tonsils: 2+ on the right. 2+ on the left.  Cardiovascular:     Rate and Rhythm: Normal rate and regular rhythm.  Pulmonary:     Effort: Pulmonary effort is normal.     Breath sounds: Normal breath sounds. No wheezing.     Comments: CTA Musculoskeletal:        General: Normal range of motion.     Cervical back: Normal range of motion and neck supple.  Skin:    General: Skin is warm and dry.  Neurological:      General: No focal deficit present.     Mental Status: She is alert and oriented to person, place, and time. Mental status is at baseline.  Psychiatric:        Mood and Affect: Mood normal.        Behavior: Behavior normal.        Thought Content: Thought content normal.        Judgment: Judgment normal.      Lab Results:  CBC    Component Value Date/Time   WBC 14.7 (H) 06/11/2019 1500   RBC 4.43 06/11/2019 1500   HGB 12.8 06/11/2019 1500   HCT 39.3 06/11/2019 1500   PLT 463.0 (H) 06/11/2019 1500   MCV 88.8 06/11/2019 1500   MCH 28.7 05/28/2019 0255   MCHC 32.5 06/11/2019 1500   RDW 14.5 06/11/2019 1500   LYMPHSABS 3.2 06/11/2019 1500   MONOABS 1.0 06/11/2019 1500   EOSABS 0.0 06/11/2019 1500   BASOSABS 0.1 06/11/2019 1500    BMET    Component Value Date/Time   NA 137 06/11/2019 1500   K 3.6 06/11/2019 1500   CL 105 06/11/2019 1500   CO2 26 06/11/2019 1500   GLUCOSE 236 (H) 06/11/2019 1500   BUN 16 06/11/2019 1500   CREATININE 1.49 (H) 06/11/2019 1500   CALCIUM 9.0 06/11/2019 1500   GFRNONAA 41 (L) 05/29/2019 0357   GFRAA 48 (L) 05/29/2019 0357    BNP    Component Value Date/Time   BNP 33.7 05/29/2019 0357    ProBNP No results found for: PROBNP  Imaging: No results found.   Assessment & Plan:   Pneumonia due to COVID-19 virus - Improved, has slight residual dyspnea on exertion and dry cough. Allergic rhinitis likely contributing. Needs repeat CXR today to ensure resolution. Recommend tapering off ICS/LABA inhaler as PFTs were normal. She can use albuterol 2 puffs every 4-6 hours as needed for breakthrough sob/wheezing.   Restless sleeper - Concern for underling sleep apnea d.t obesity and family hx - Refer to sleep medicine for consult    Glenford Bayley, NP 12/15/2019

## 2019-12-12 NOTE — Progress Notes (Unsigned)
@Patient  ID: , female    DOB: [birth year], 39 y.o.   MRN: [id number]  No chief complaint on file.   Referring provider: [id number], *  HPI: 39 year old female, never smoked. PMH significant for HTN, CAD, NSTEMI, hyperlipidemia, pneumonia d/t COVID-19, acute respiratory failure. Seen by Pulmonary NP for hospital follow-up on 06/04/19, last seen on 06/30/19.   Previous [facility] pulmonary encounters: Lia Vigilante is a 39 y.o. female  admitted 5/11 with reports of progressive shortness of breath. Hx of post-COVID 19 pneumonia. PE ruled out but imaging shows dense R>L infiltrate worrisome for post viral bacterial PNA. Increased O2 needs. She was seen as an inpatient by Dr. 7/11. 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 6/2 she has had improvement in her oxygen needs.  06/11/2019  Pt. Presents for follow up. She has completed 5 days of Levaquin, and she has been off her oxygen for the last 3 days. She has one more day of prednisone to take . Sats on RA today are 96% after ambulating back to the exam room. She does have occasional shortness of breath with exertion. She stops and sits when her body tells her to. She has used her rescue inhaler x 1 since last week. She denies any wheezing,she does have a cough, but it is better than it was. She is taking the robitussin cough syrup in the morning and at bedtime. The cough is productive for clear secretions. She has not had any fever or sweating. She does have a case of vaginitis from antibiotic use. We will treat with diflucan.She denies shortness of breath orthopnea or hemoptysis.  06/30/2019 She is feeling pretty well, she has some residual shortness of breath and cough. Associated chest tightness and states that her left side will catch when coughing. CTA in May showed no evidence of PE. Multifocal pneumonia consistent with  COVID-19. Repeat CXR showed resolving. She is using albuterol rescue inhaler once daily. Completed Levaquin course, develop vaginitis/yeast infection which resolved with diflucan. She did notice improvement with prednisone. As a child she had recurrent bronchitis and possible underlying asthma.   08/26/2019 Patient presents today for 6 week follow-up. Accompanied by female friend. She had residual shortness of breath, cough and chest tightness since covid-19 pneumonia. Improved with oral steroids. Trial ICS/LABA inhaler, sent in prescription for Symbicort 80. CXR showed interval improvement of bilateral mid to lower lung field airspace disease. Recommending PFTs in 4 weeks. Referred to cardiology for grade 1 diastolic dysfunction on recent echocardiogram   Feels improvement in shortness of breath since being on Advair. She is still using her rescue inhaler several times a week but not every day. She feels cough is side effect from lisinopril. She is not currently requiring oxygen but uses it as needed. She has some post nasal drip symptoms, she has not tried taking antihistamine or nasal spray. Female friend smells of marijuana, patient does not smoke. Advised he not smoke near patient.   12/12/2019 - Interim hx Patient presents today for 6 month follow-up. During last visit she had normal PFTs, we decided to try tapering Advair HFA to 1 puff twice daily. Repeat CXR on 06/30/19 showed interval improvement of the bilateral mid to lower lung field airspace disease.    She went to [facility] yesterday     Test Results: CXR 06/11/2019 Moderate right hemidiaphragm elevation. No pleural effusion or pneumothorax. Left greater than right  perihilar interstitial opacities again identified. Felt to be similar on the left and minimally improved on the right. No new pulmonary opacity.  Pulmonary function testing: 08/26/2019 FVC 3.32 (95%), FEV1 2.85 (100%), ratio 86 Interpretation: Normal spirometry and diffusion capacity.  No BD response.  Allergies  Allergen Reactions  . Oxycodone-Acetaminophen Nausea And Vomiting  . Oxycodone Nausea And Vomiting    Immunization History  Administered Date(s) Administered  . Influenza,inj,Quad PF,6+ Mos 10/07/2014, 10/10/2015, 11/01/2017, 10/08/2018  . Influenza-Unspecified 11/04/2011, 10/07/2014, 10/10/2015  . PPD Test 05/18/2014, 09/20/2018  . Tdap 03/06/2008    Past Medical History:  Diagnosis Date  . Coronary artery disease   . Hypertension     Tobacco History: Social History   Tobacco Use  Smoking Status Never Smoker  Smokeless Tobacco Never Used   Counseling given: Not Answered   Outpatient Medications Prior to Visit  Medication Sig Dispense Refill  . acetaminophen (TYLENOL) 500 MG tablet Take 1,000 mg by mouth every 6 (six) hours as needed for fever or headache (pain). For pain    . albuterol (VENTOLIN HFA) 108 (90 Base) MCG/ACT inhaler Inhale 2 puffs into the lungs every 6 (six) hours as needed. (Patient taking differently: Inhale 2 puffs into the lungs every 6 (six) hours as needed for wheezing or shortness of breath. ) 6.7 g 0  . aspirin EC 81 MG tablet Take 81 mg by mouth daily.    Marland Kitchen atorvastatin (LIPITOR) 80 MG tablet Take 1 tablet (80 mg total) by mouth daily. 30 tablet 0  . carvedilol (COREG) 6.25 MG tablet Take 1 tablet (6.25 mg total) by mouth 2 (two) times daily. 60 tablet 0  . etonogestrel (NEXPLANON) 68 MG IMPL implant 1 each by Subdermal route continuous. Implanted 2020     . famotidine (PEPCID) 20 MG tablet Take 1 tablet (20 mg total) by mouth daily. 30 tablet 2  . fluticasone-salmeterol (ADVAIR HFA) 115-21 MCG/ACT inhaler Inhale 2 puffs into the lungs 2 (two) times daily. 1 Inhaler 12  . Multiple Vitamins-Minerals (EMERGEN-C IMMUNE PLUS/VIT D) CHEW Chew 1 tablet by mouth 2 (two) times daily.    . sertraline (ZOLOFT) 50 MG tablet Take 50 mg by mouth daily.     No facility-administered medications prior to visit.      Review of  Systems  Review of Systems   Physical Exam  There were no vitals taken for this visit. Physical Exam   Lab Results:  CBC    Component Value Date/Time   WBC 14.7 (H) 06/11/2019 1500   RBC 4.43 06/11/2019 1500   HGB 12.8 06/11/2019 1500   HCT 39.3 06/11/2019 1500   PLT 463.0 (H) 06/11/2019 1500   MCV 88.8 06/11/2019 1500   MCH 28.7 05/28/2019 0255   MCHC 32.5 06/11/2019 1500   RDW 14.5 06/11/2019 1500   LYMPHSABS 3.2 06/11/2019 1500   MONOABS 1.0 06/11/2019 1500   EOSABS 0.0 06/11/2019 1500   BASOSABS 0.1 06/11/2019 1500    BMET    Component Value Date/Time   NA 137 06/11/2019 1500   K 3.6 06/11/2019 1500   CL 105 06/11/2019 1500   CO2 26 06/11/2019 1500   GLUCOSE 236 (H) 06/11/2019 1500   BUN 16 06/11/2019 1500   CREATININE 1.49 (H) 06/11/2019 1500   CALCIUM 9.0 06/11/2019 1500   GFRNONAA 41 (L) 05/29/2019 0357   GFRAA 48 (L) 05/29/2019 0357    BNP    Component Value Date/Time   BNP 33.7 05/29/2019 0357    ProBNP  No results found for: PROBNP  Imaging: No results found.   Assessment & Plan:   No problem-specific Assessment & Plan notes found for this encounter.     Glenford Bayley, NP 12/12/2019

## 2019-12-15 ENCOUNTER — Other Ambulatory Visit: Payer: Self-pay

## 2019-12-15 ENCOUNTER — Ambulatory Visit (INDEPENDENT_AMBULATORY_CARE_PROVIDER_SITE_OTHER): Payer: Medicaid Other

## 2019-12-15 ENCOUNTER — Ambulatory Visit (INDEPENDENT_AMBULATORY_CARE_PROVIDER_SITE_OTHER): Payer: Medicaid Other | Admitting: Primary Care

## 2019-12-15 ENCOUNTER — Encounter: Payer: Self-pay | Admitting: Primary Care

## 2019-12-15 VITALS — BP 126/80 | HR 91 | Temp 97.7°F | Ht 66.5 in | Wt 247.8 lb

## 2019-12-15 DIAGNOSIS — U071 COVID-19: Secondary | ICD-10-CM

## 2019-12-15 DIAGNOSIS — G479 Sleep disorder, unspecified: Secondary | ICD-10-CM

## 2019-12-15 DIAGNOSIS — J1282 Pneumonia due to coronavirus disease 2019: Secondary | ICD-10-CM

## 2019-12-15 MED ORDER — ALBUTEROL SULFATE HFA 108 (90 BASE) MCG/ACT IN AERS: 2.0000 | INHALATION_SPRAY | Freq: Four times a day (QID) | RESPIRATORY_TRACT | 1 refills | Status: DC | PRN

## 2019-12-15 MED ORDER — FLUTICASONE PROPIONATE 50 MCG/ACT NA SUSP: 1.0000 | Freq: Every day | NASAL | 2 refills | Status: DC

## 2019-12-15 MED ORDER — ADVAIR HFA 115-21 MCG/ACT IN AERO
1.0000 | INHALATION_SPRAY | Freq: Two times a day (BID) | RESPIRATORY_TRACT | 1 refills | Status: DC
Start: 2019-12-15 — End: 2023-05-17

## 2019-12-15 MED ORDER — LORATADINE 10 MG PO TABS: 10.0000 mg | ORAL_TABLET | Freq: Every day | ORAL | 11 refills | Status: DC

## 2019-12-15 NOTE — Patient Instructions (Addendum)
Pleasure seeing you again today Maria Bradford  Recommendations: - Taper Advair to once daily x 2 weeks, if no worsening in breathing you can then stop - Use albuterol rescue inhaler 2 puffs every 4-6 hours for shortness of breath/wheezing  - Use Claritin and Flonase nasal for allergic rhinitis  - Referring you to sleep specialist to evaluate for possible underlying sleep apnea  - Continue to work on weight loss efforts  Orders: - CXR today re: fu covid-19 pneumonia   Rx: - Refill Advair and Albuterol hfa  Follow-up: - Needs apt with sleep medicine doctor Craige Cotta, [place], [place] or [place]) - new patient, suspected sleep apnea    Allergic Rhinitis, Adult Allergic rhinitis is a reaction to allergens in the air. Allergens are tiny specks (particles) in the air that cause your body to have an allergic reaction. This condition cannot be passed from person to person (is not contagious). Allergic rhinitis cannot be cured, but it can be controlled. There are two types of allergic rhinitis:  Seasonal. This type is also called hay fever. It happens only during certain times of the year.  Perennial. This type can happen at any time of the year. What are the causes? This condition may be caused by:  Pollen from grasses, trees, and weeds.  House dust mites.  Pet dander.  Mold. What are the signs or symptoms? Symptoms of this condition include:  Sneezing.  Runny or stuffy nose (nasal congestion).  A lot of mucus in the back of the throat (postnasal drip).  Itchy nose.  Tearing of the eyes.  Trouble sleeping.  Being sleepy during day. How is this treated? There is no cure for this condition. You should avoid things that trigger your symptoms (allergens). Treatment can help to relieve symptoms. This may include:  Medicines that block allergy symptoms, such as antihistamines. These may be given as a shot, nasal spray, or pill.  Shots that are given until your body becomes less sensitive  to the allergen (desensitization).  Stronger medicines, if all other treatments have not worked. Follow these instructions at home: Avoiding allergens   Find out what you are allergic to. Common allergens include smoke, dust, and pollen.  Avoid them if you can. These are some of the things that you can do to avoid allergens: ? Replace carpet with wood, tile, or vinyl flooring. Carpet can trap dander and dust. ? Clean any mold found in the home. ? Do not smoke. Do not allow smoking in your home. ? Change your heating and air conditioning filter at least once a month. ? During allergy season:  Keep windows closed as much as you can. If possible, use air conditioning when there is a lot of pollen in the air.  Use a special filter for allergies with your furnace and air conditioner.  Plan outdoor activities when pollen counts are lowest. This is usually during the early morning or evening hours.  If you do go outdoors when pollen count is high, wear a special mask for people with allergies.  When you come indoors, take a shower and change your clothes before sitting on furniture or bedding. General instructions  Do not use fans in your home.  Do not hang clothes outside to dry.  Wear sunglasses to keep pollen out of your eyes.  Wash your hands right away after you touch household pets.  Take over-the-counter and prescription medicines only as told by your doctor.  Keep all follow-up visits as told by your  doctor. This is important. Contact a doctor if:  You have a fever.  You have a cough that does not go away (is persistent).  You start to make whistling sounds when you breathe (wheeze).  Your symptoms do not get better with treatment.  You have thick fluid coming from your nose.  You start to have nosebleeds. Get help right away if:  Your tongue or your lips are swollen.  You have trouble breathing.  You feel dizzy or you feel like you are going to pass out  (faint).  You have cold sweats. Summary  Allergic rhinitis is a reaction to allergens in the air.  This condition may be caused by allergens. These include pollen, dust mites, pet dander, and mold.  Symptoms include a runny, itchy nose, sneezing, or tearing eyes. You may also have trouble sleeping or feel sleepy during the day.  Treatment includes taking medicines and avoiding allergens. You may also get shots or take stronger medicines.  Get help if you have a fever or a cough that does not stop. Get help right away if you are short of breath. This information is not intended to replace advice given to you by your health care provider. Make sure you discuss any questions you have with your health care provider. Document Revised: 04/16/2018 Document Reviewed: 07/17/2017 Elsevier Patient Education  2020 [employer].

## 2019-12-15 NOTE — Progress Notes (Signed)
CXR was normal. Resolution of previous pneumonia on imaging.

## 2019-12-15 NOTE — Assessment & Plan Note (Addendum)
-   Concern for underling sleep apnea d.t obesity and family hx - Refer to sleep medicine for consult

## 2019-12-15 NOTE — Assessment & Plan Note (Signed)
-   Improved, has slight residual dyspnea on exertion and dry cough. Allergic rhinitis likely contributing. Needs repeat CXR today to ensure resolution. Recommend tapering off ICS/LABA inhaler as PFTs were normal. She can use albuterol 2 puffs every 4-6 hours as needed for breakthrough sob/wheezing.

## 2019-12-26 NOTE — Progress Notes (Deleted)
12/29/19- 39 yoF never smoker for sleep evaluation courtesy of Buelah Manis, NP with concern of OSA.due to obesity and + fam hx.  Medical hx includes HTN, CAD, NSTEMI, hyperlipidemia, pneumonia d/t COVID-19, acute respiratory failure Seen by Clent Ridges, NP 12/6 f/u post-viral infiltrates after Covid pneumonia May, 2021. Noting seasonal allergic rhinitis. Infiltrates and hypoxia had resolved.  Epworth score- Body weight today- Covid vax- Flu vax-    CXR 12/15/19-  IMPRESSION: No acute cardiopulmonary disease. Resolution of patchy opacities seen on previous imaging.

## 2019-12-29 ENCOUNTER — Institutional Professional Consult (permissible substitution): Payer: Medicaid Other | Admitting: Internal Medicine

## 2020-01-23 ENCOUNTER — Telehealth: Payer: Self-pay | Admitting: Primary Care

## 2020-01-23 NOTE — Telephone Encounter (Signed)
Spoke with pt, requesting a work note to return to work.  Pt is being seen by CY on Tuesday.  Pt states she is not returning to work before then.  I advised that we would be happy to write the work note at that visit based on her office visit with CY.  Pt expressed understanding.  Nothing further needed at this time- will close encounter.

## 2020-01-26 NOTE — Progress Notes (Signed)
01/27/20- 39 yoF never smoker for sleep evaluation courtesy of Buelah Manis, NP with concern of OSA.due to obesity and + fam hx.  Medical hx includes HTN, CAD, NSTEMI/ stents,  Gr2 Diastolic Dysfunction, CKD2, Hyperlipidemia, Pneumonia d/t COVID-19, acute respiratory failure, Depression/ Anxiety, Allergic Rhinitis, Asthma Seen by Clent Ridges, NP 12/6 f/u post-viral infiltrates after Covid pneumonia May, 2021. Noting seasonal allergic rhinitis. Infiltrates and hypoxia had resolved.  Epworth score- 14 Body weight today- 252 lbs Covid vax- 2 Phizer Flu vax- had -----Snoring, witnessed apneas  Awareness of loud snoring, long predates Covid infection. Witnessed apneas. Daytime tiredness, some drowsy driving- discussed.  Occasional Tylenol PM for sleep. Sleep is fragmented, frequent waking, bathroom trips. Daytime naps. 1 cup coffee. Brother uses CPAP. Nasal congestion- allergy- uses Flonase and denies additional help now.  CXR 12/15/19-  IMPRESSION: No acute cardiopulmonary disease. Resolution of patchy opacities seen on previous imaging.  Prior to Admission medications   Medication Sig Start Date End Date Taking? Authorizing Provider  acetaminophen (TYLENOL) 500 MG tablet Take 1,000 mg by mouth every 6 (six) hours as needed for fever or headache (pain). For pain   Yes [provider]  albuterol (VENTOLIN HFA) 108 (90 Base) MCG/ACT inhaler Inhale 2 puffs into the lungs every 6 (six) hours as needed for wheezing or shortness of breath. 12/15/19  Yes Glenford Bayley, NP  aspirin EC 81 MG tablet Take 81 mg by mouth daily.   Yes [provider]  atorvastatin (LIPITOR) 80 MG tablet Take 1 tablet (80 mg total) by mouth daily. 05/31/19  Yes Tyrone Nine, MD  etonogestrel (NEXPLANON) 68 MG IMPL implant 1 each by Subdermal route continuous. Implanted 2020   Yes [provider]  famotidine (PEPCID) 20 MG tablet Take 1 tablet (20 mg total) by mouth daily. 06/30/19  Yes Glenford Bayley,  NP  fluticasone (FLONASE) 50 MCG/ACT nasal spray Place 1 spray into both nostrils daily. 12/15/19  Yes Glenford Bayley, NP  fluticasone-salmeterol (ADVAIR HFA) [phone] MCG/ACT inhaler Inhale 1 puff into the lungs 2 (two) times daily. 12/15/19  Yes Glenford Bayley, NP  loratadine (CLARITIN) 10 MG tablet Take 1 tablet (10 mg total) by mouth daily. 12/15/19  Yes Glenford Bayley, NP  Multiple Vitamins-Minerals (EMERGEN-C IMMUNE PLUS/VIT D) CHEW Chew 1 tablet by mouth 2 (two) times daily.   Yes [provider]  sertraline (ZOLOFT) 50 MG tablet Take 50 mg by mouth daily. 04/28/19  Yes [provider]  carvedilol (COREG) 6.25 MG tablet Take 1 tablet (6.25 mg total) by mouth 2 (two) times daily. 05/12/19 06/11/19  Maretta Bees, MD   Past Medical History:  Diagnosis Date  . Coronary artery disease   . Hypertension    Past Surgical History:  Procedure Laterality Date  . CESAREAN SECTION    . CORONARY ANGIOPLASTY WITH STENT PLACEMENT    . TUBAL LIGATION     History reviewed. No pertinent family history. Social History   Socioeconomic History  . Marital status: Single    Spouse name: Not on file  . Number of children: Not on file  . Years of education: Not on file  . Highest education level: Not on file  Occupational History  . Not on file  Tobacco Use  . Smoking status: Never Smoker  . Smokeless tobacco: Never Used  Vaping Use  . Vaping Use: Never used  Substance and Sexual Activity  . Alcohol use: No  . Drug use: No  . Sexual  activity: Yes    Birth control/protection: Surgical  Other Topics Concern  . Not on file  Social History Narrative  . Not on file   Social Determinants of Health   Financial Resource Strain: Not on file  Food Insecurity: Not on file  Transportation Needs: Not on file  Physical Activity: Not on file  Stress: Not on file  Social Connections: Not on file  Intimate Partner Violence: Not on file   ROS-see HPI   + =  positive Constitutional:    weight loss, night sweats, fevers, chills, +fatigue, lassitude. HEENT:    headaches, difficulty swallowing, tooth/dental problems, sore throat,       sneezing, itching, ear ache, +nasal congestion, post nasal drip, snoring CV:    chest pain, orthopnea, PND, swelling in lower extremities, anasarca,                                  dizziness, palpitations Resp:   shortness of breath with exertion or at rest.                productive cough,   non-productive cough, coughing up of blood.              change in color of mucus.  wheezing.   Skin:    rash or lesions. GI:  No-   heartburn, indigestion, abdominal pain, nausea, vomiting, diarrhea,                 change in bowel habits, loss of appetite GU: dysuria, change in color of urine, no urgency or frequency.   flank pain. MS:   joint pain, stiffness, decreased range of motion, back pain. Neuro-     nothing unusual Psych:  change in mood or affect.  depression or anxiety.   memory loss.  OBJ- Physical Exam General- Alert, Oriented, Affect-appropriate, Distress- none acute, + tall/ big/overweight Skin- rash-none, lesions- none, excoriation- none Lymphadenopathy- none Head- atraumatic            Eyes- Gross vision intact, PERRLA, conjunctivae and secretions clear            Ears- Hearing, canals-normal            Nose- + snorting and sniffing no-Septal dev, mucus, polyps, erosion, perforation             Throat- Mallampati II-III , mucosa clear , drainage- none, tonsils+ moderate, + teeth Neck- flexible , trachea midline, no stridor , thyroid nl, carotid no bruit Chest - symmetrical excursion , unlabored           Heart/CV- RRR , no murmur , no gallop  , no rub, nl s1 s2                           - JVD- none , edema- none, stasis changes- none, varices- none           Lung- clear to P&A, wheeze- none, cough- none , dullness-none, rub- none           Chest wall-  Abd-  Br/ Gen/ Rectal- Not done, not  indicated Extrem- cyanosis- none, clubbing, none, atrophy- none, strength- nl Neuro- grossly intact to observation

## 2020-01-27 ENCOUNTER — Encounter: Payer: Self-pay | Admitting: Internal Medicine

## 2020-01-27 ENCOUNTER — Other Ambulatory Visit: Payer: Self-pay

## 2020-01-27 ENCOUNTER — Ambulatory Visit (INDEPENDENT_AMBULATORY_CARE_PROVIDER_SITE_OTHER): Payer: Medicaid Other | Admitting: Internal Medicine

## 2020-01-27 VITALS — BP 134/82 | HR 103 | Temp 98.0°F | Ht 66.5 in | Wt 252.0 lb

## 2020-01-27 DIAGNOSIS — J3089 Other allergic rhinitis: Secondary | ICD-10-CM

## 2020-01-27 DIAGNOSIS — G479 Sleep disorder, unspecified: Secondary | ICD-10-CM | POA: Diagnosis not present

## 2020-01-27 DIAGNOSIS — R0683 Snoring: Secondary | ICD-10-CM

## 2020-01-27 DIAGNOSIS — J302 Other seasonal allergic rhinitis: Secondary | ICD-10-CM | POA: Diagnosis not present

## 2020-01-27 NOTE — Assessment & Plan Note (Signed)
Snorting and sniffing today but says she is well-controled and uses Flonase

## 2020-01-27 NOTE — Assessment & Plan Note (Signed)
Snoring, daytime fatigue, nonrestorative sleep. Probable OSA. Appropriate discussion, education, questions answered. Plan- sleep study, then likely CPAP.

## 2020-01-27 NOTE — Patient Instructions (Signed)
Order- schedule home sleep test   Dx Snoring  Please call us about 2 weeks after your sleep test to see if results and recommendations are ready yet.

## 2020-01-29 ENCOUNTER — Telehealth: Payer: Self-pay

## 2020-01-29 DIAGNOSIS — G4733 Obstructive sleep apnea (adult) (pediatric): Secondary | ICD-10-CM

## 2020-01-29 NOTE — Telephone Encounter (Signed)
-----   Message from Waymon Budge, MD sent at 01/29/2020  4:53 PM EST ----- Regarding: FW: home study Please change sleep study order to [facility] dx [facility]SA  - Thanks   CDY ----- Message ----- From: Tobe Sos Sent: 01/29/2020  10:22 AM EST To: Waymon Budge, MD, Marylynn Pearson, RN Subject: home study                                     I need a new order placed for in lab study this pt has medicaid and it will need to be in lab thanks libby

## 2020-01-29 NOTE — Telephone Encounter (Signed)
Order for split night study has been placed.

## 2020-03-02 NOTE — Progress Notes (Deleted)
01/27/20- 39 yoF never smoker for sleep evaluation courtesy of Buelah Manis, NP with concern of OSA.due to obesity and + fam hx.  Medical hx includes HTN, CAD, NSTEMI/ stents,  Gr2 Diastolic Dysfunction, CKD2, Hyperlipidemia, Pneumonia d/t COVID-19, acute respiratory failure, Depression/ Anxiety, Allergic Rhinitis, Asthma Seen by Clent Ridges, NP 12/6 f/u post-viral infiltrates after Covid pneumonia May, 2021. Noting seasonal allergic rhinitis. Infiltrates and hypoxia had resolved.  Epworth score- 14 Body weight today- 252 lbs Covid vax- 2 Phizer Flu vax- had -----Snoring, witnessed apneas  Awareness of loud snoring, long predates Covid infection. Witnessed apneas. Daytime tiredness, some drowsy driving- discussed.  Occasional Tylenol PM for sleep. Sleep is fragmented, frequent waking, bathroom trips. Daytime naps. 1 cup coffee. Brother uses CPAP. Nasal congestion- allergy- uses Flonase and denies additional help now.  CXR 12/15/19-  IMPRESSION: No acute cardiopulmonary disease. Resolution of patchy opacities seen on previous imaging.  03/03/20- 40 yoF courtesy of Buelah Manis, NP with concern of OSA.due to obesity and + fam hx.  Medical hx includes HTN, CAD, NSTEMI/ stents,  Gr2 Diastolic Dysfunction, CKD2, Hyperlipidemia, Pneumonia d/t COVID-19, acute respiratory failure, Depression/ Anxiety, Allergic Rhinitis, Asthma Sleep Study ordered for 01/29/20- not done Body weight today- Covid vax- Flu vax-     ROS-see HPI   + = positive Constitutional:    weight loss, night sweats, fevers, chills, +fatigue, lassitude. HEENT:    headaches, difficulty swallowing, tooth/dental problems, sore throat,       sneezing, itching, ear ache, +nasal congestion, post nasal drip, snoring CV:    chest pain, orthopnea, PND, swelling in lower extremities, anasarca,                                  dizziness, palpitations Resp:   shortness of breath with exertion or at rest.                productive cough,    non-productive cough, coughing up of blood.              change in color of mucus.  wheezing.   Skin:    rash or lesions. GI:  No-   heartburn, indigestion, abdominal pain, nausea, vomiting, diarrhea,                 change in bowel habits, loss of appetite GU: dysuria, change in color of urine, no urgency or frequency.   flank pain. MS:   joint pain, stiffness, decreased range of motion, back pain. Neuro-     nothing unusual Psych:  change in mood or affect.  depression or anxiety.   memory loss.  OBJ- Physical Exam General- Alert, Oriented, Affect-appropriate, Distress- none acute, + tall/ big/overweight Skin- rash-none, lesions- none, excoriation- none Lymphadenopathy- none Head- atraumatic            Eyes- Gross vision intact, PERRLA, conjunctivae and secretions clear            Ears- Hearing, canals-normal            Nose- + snorting and sniffing no-Septal dev, mucus, polyps, erosion, perforation             Throat- Mallampati II-III , mucosa clear , drainage- none, tonsils+ moderate, + teeth Neck- flexible , trachea midline, no stridor , thyroid nl, carotid no bruit Chest - symmetrical excursion , unlabored           Heart/CV- RRR , no murmur ,  no gallop  , no rub, nl s1 s2                           - JVD- none , edema- none, stasis changes- none, varices- none           Lung- clear to P&A, wheeze- none, cough- none , dullness-none, rub- none           Chest wall-  Abd-  Br/ Gen/ Rectal- Not done, not indicated Extrem- cyanosis- none, clubbing, none, atrophy- none, strength- nl Neuro- grossly intact to observation

## 2020-03-03 ENCOUNTER — Ambulatory Visit (HOSPITAL_BASED_OUTPATIENT_CLINIC_OR_DEPARTMENT_OTHER): Payer: Medicaid Other | Admitting: Internal Medicine

## 2020-03-19 ENCOUNTER — Ambulatory Visit (HOSPITAL_BASED_OUTPATIENT_CLINIC_OR_DEPARTMENT_OTHER): Payer: Medicaid Other | Attending: Internal Medicine | Admitting: Internal Medicine

## 2020-04-22 ENCOUNTER — Telehealth: Payer: Self-pay

## 2020-04-22 NOTE — Telephone Encounter (Signed)
ATC patient to see if she would like to reschedule her appointment for Monday since she has not done her sleep study yet. LMTCB  When patient calls back please reschedule her for after (05/20/20) her sleep study with Dr. Maple Hudson.

## 2020-04-25 NOTE — Progress Notes (Deleted)
01/27/20- 39 yoF never smoker for sleep evaluation courtesy of Buelah Manis, NP with concern of OSA.due to obesity and + fam hx.  Medical hx includes HTN, CAD, NSTEMI/ stents,  Gr2 Diastolic Dysfunction, CKD2, Hyperlipidemia, Pneumonia d/t COVID-19, acute respiratory failure, Depression/ Anxiety, Allergic Rhinitis, Asthma Seen by Clent Ridges, NP 12/6 f/u post-viral infiltrates after Covid pneumonia May, 2021. Noting seasonal allergic rhinitis. Infiltrates and hypoxia had resolved.  Epworth score- 14 Body weight today- 252 lbs Covid vax- 2 Phizer Flu vax- had -----Snoring, witnessed apneas  Awareness of loud snoring, long predates Covid infection. Witnessed apneas. Daytime tiredness, some drowsy driving- discussed.  Occasional Tylenol PM for sleep. Sleep is fragmented, frequent waking, bathroom trips. Daytime naps. 1 cup coffee. Brother uses CPAP. Nasal congestion- allergy- uses Flonase and denies additional help now.  CXR 12/15/19-  IMPRESSION: No acute cardiopulmonary disease. Resolution of patchy opacities seen on previous imaging.  04/26/20- 40 yoF never smoker for sleep evaluation Split night study ordered- not done   ROS-see HPI   + = positive Constitutional:    weight loss, night sweats, fevers, chills, +fatigue, lassitude. HEENT:    headaches, difficulty swallowing, tooth/dental problems, sore throat,       sneezing, itching, ear ache, +nasal congestion, post nasal drip, snoring CV:    chest pain, orthopnea, PND, swelling in lower extremities, anasarca,                                   dizziness, palpitations Resp:   shortness of breath with exertion or at rest.                productive cough,   non-productive cough, coughing up of blood.              change in color of mucus.  wheezing.   Skin:    rash or lesions. GI:  No-   heartburn, indigestion, abdominal pain, nausea, vomiting, diarrhea,                 change in bowel habits, loss of appetite GU: dysuria, change in color of  urine, no urgency or frequency.   flank pain. MS:   joint pain, stiffness, decreased range of motion, back pain. Neuro-     nothing unusual Psych:  change in mood or affect.  depression or anxiety.   memory loss.  OBJ- Physical Exam General- Alert, Oriented, Affect-appropriate, Distress- none acute, + tall/ big/overweight Skin- rash-none, lesions- none, excoriation- none Lymphadenopathy- none Head- atraumatic            Eyes- Gross vision intact, PERRLA, conjunctivae and secretions clear            Ears- Hearing, canals-normal            Nose- + snorting and sniffing no-Septal dev, mucus, polyps, erosion, perforation             Throat- Mallampati II-III , mucosa clear , drainage- none, tonsils+ moderate, + teeth Neck- flexible , trachea midline, no stridor , thyroid nl, carotid no bruit Chest - symmetrical excursion , unlabored           Heart/CV- RRR , no murmur , no gallop  , no rub, nl s1 s2                           - JVD- none , edema- none, stasis changes- none,  varices- none           Lung- clear to P&A, wheeze- none, cough- none , dullness-none, rub- none           Chest wall-  Abd-  Br/ Gen/ Rectal- Not done, not indicated Extrem- cyanosis- none, clubbing, none, atrophy- none, strength- nl Neuro- grossly intact to observation

## 2020-04-26 ENCOUNTER — Encounter (HOSPITAL_BASED_OUTPATIENT_CLINIC_OR_DEPARTMENT_OTHER): Payer: Self-pay | Admitting: *Deleted

## 2020-04-26 ENCOUNTER — Emergency Department (HOSPITAL_BASED_OUTPATIENT_CLINIC_OR_DEPARTMENT_OTHER)
Admission: EM | Admit: 2020-04-26 | Discharge: 2020-04-26 | Disposition: A | Discharge status: Home / Self Care | Payer: Medicaid Other | Attending: Emergency Medicine | Admitting: Emergency Medicine

## 2020-04-26 ENCOUNTER — Other Ambulatory Visit: Payer: Self-pay

## 2020-04-26 ENCOUNTER — Emergency Department (HOSPITAL_BASED_OUTPATIENT_CLINIC_OR_DEPARTMENT_OTHER): Payer: Medicaid Other

## 2020-04-26 ENCOUNTER — Ambulatory Visit: Payer: Medicaid Other | Admitting: Internal Medicine

## 2020-04-26 DIAGNOSIS — Z8616 Personal history of COVID-19: Secondary | ICD-10-CM | POA: Insufficient documentation

## 2020-04-26 DIAGNOSIS — Z79899 Other long term (current) drug therapy: Secondary | ICD-10-CM | POA: Insufficient documentation

## 2020-04-26 DIAGNOSIS — S63501A Unspecified sprain of right wrist, initial encounter: Secondary | ICD-10-CM | POA: Insufficient documentation

## 2020-04-26 DIAGNOSIS — I2510 Atherosclerotic heart disease of native coronary artery without angina pectoris: Secondary | ICD-10-CM | POA: Diagnosis not present

## 2020-04-26 DIAGNOSIS — Z7982 Long term (current) use of aspirin: Secondary | ICD-10-CM | POA: Diagnosis not present

## 2020-04-26 DIAGNOSIS — W010XXA Fall on same level from slipping, tripping and stumbling without subsequent striking against object, initial encounter: Secondary | ICD-10-CM | POA: Insufficient documentation

## 2020-04-26 DIAGNOSIS — I10 Essential (primary) hypertension: Secondary | ICD-10-CM | POA: Insufficient documentation

## 2020-04-26 DIAGNOSIS — S60221A Contusion of right hand, initial encounter: Secondary | ICD-10-CM | POA: Diagnosis not present

## 2020-04-26 DIAGNOSIS — S6991XA Unspecified injury of right wrist, hand and finger(s), initial encounter: Secondary | ICD-10-CM | POA: Diagnosis present

## 2020-04-26 NOTE — Discharge Instructions (Signed)
Use ice on your wrist and take tylenol for pain

## 2020-04-26 NOTE — ED Triage Notes (Signed)
She tripped and fell yesterday. Injury to her right hand.

## 2020-04-26 NOTE — ED Provider Notes (Signed)
[facility] EMERGENCY DEPARTMENT Provider Note   CSN: [id number] Arrival date & time: 04/26/20  1446     History Chief Complaint  Patient presents with  . Hand Injury    Maria Bradford is a 40 y.o. female.  The history is provided by the patient.  Hand Injury Location:  Hand Hand location:  R hand and dorsum of R hand Injury: yes   Time since incident:  1 day Mechanism of injury: fall   Fall:    Fall occurred: Was at an [employer] with grandchildren when she tripped over a stump and fell on her fist.   Impact surface:  Designer, fashion/clothing of impact:  Hands Pain details:    Quality:  Pressure, shooting, sharp and throbbing   Radiates to:  Does not radiate   Severity:  Moderate   Onset quality:  Sudden   Timing:  Constant   Progression:  Unchanged Handedness:  Right-handed Foreign body present:  No foreign bodies Prior injury to area:  No Relieved by:  Nothing Worsened by:  Movement Ineffective treatments: rest. Associated symptoms: decreased range of motion   Associated symptoms comment:  Swelling Risk factors: no frequent fractures        Past Medical History:  Diagnosis Date  . Coronary artery disease   . Hypertension     Patient Active Problem List   Diagnosis Date Noted  . Seasonal and perennial allergic rhinitis 01/27/2020  . Restless sleeper 12/15/2019  . Diastolic dysfunction 07/03/2019  . Acute respiratory failure with hypoxemia (HCC) 05/21/2019  . Sepsis due to pneumonia (HCC) 05/20/2019  . History of COVID-19 05/20/2019  . Acute respiratory failure with hypoxia (HCC) 05/20/2019  . Pneumonia of both lungs due to infectious organism 05/20/2019  . Mixed hyperlipidemia 05/20/2019  . Essential hypertension 05/20/2019  . Coronary artery disease involving native coronary artery of native heart without angina pectoris 05/20/2019  . Pneumonia due to COVID-19 virus 05/05/2019  . COVID-19 05/05/2019  . Hypoxia     Past Surgical  History:  Procedure Laterality Date  . CESAREAN SECTION    . CORONARY ANGIOPLASTY WITH STENT PLACEMENT    . TUBAL LIGATION       OB History    Gravida  1   Para      Term      Preterm      AB      Living        SAB      IAB      Ectopic      Multiple      Live Births              No family history on file.  Social History   Tobacco Use  . Smoking status: Never Smoker  . Smokeless tobacco: Never Used  Vaping Use  . Vaping Use: Never used  Substance Use Topics  . Alcohol use: No  . Drug use: No    Home Medications Prior to Admission medications   Medication Sig Start Date End Date Taking? Authorizing Provider  acetaminophen (TYLENOL) 500 MG tablet Take 1,000 mg by mouth every 6 (six) hours as needed for fever or headache (pain). For pain   Yes [provider]  aspirin EC 81 MG tablet Take 81 mg by mouth daily.   Yes [provider]  atorvastatin (LIPITOR) 80 MG tablet Take 1 tablet (80 mg total) by mouth daily. 05/31/19  Yes Tyrone Nine, MD  loratadine (CLARITIN) 10 MG tablet Take 1 tablet (10 mg total) by mouth daily. 12/15/19  Yes Glenford Bayley, NP  Multiple Vitamins-Minerals (EMERGEN-C IMMUNE PLUS/VIT D) CHEW Chew 1 tablet by mouth 2 (two) times daily.   Yes [provider]  sertraline (ZOLOFT) 50 MG tablet Take 50 mg by mouth daily. 04/28/19  Yes [provider]  albuterol (VENTOLIN HFA) 108 (90 Base) MCG/ACT inhaler Inhale 2 puffs into the lungs every 6 (six) hours as needed for wheezing or shortness of breath. 12/15/19   Glenford Bayley, NP  carvedilol (COREG) 6.25 MG tablet Take 1 tablet (6.25 mg total) by mouth 2 (two) times daily. 05/12/19 06/11/19  Maretta Bees, MD  etonogestrel (NEXPLANON) 68 MG IMPL implant 1 each by Subdermal route continuous. Implanted 2020    [provider]  famotidine (PEPCID) 20 MG tablet Take 1 tablet (20 mg total) by mouth daily. 06/30/19   Glenford Bayley, NP   fluticasone (FLONASE) 50 MCG/ACT nasal spray Place 1 spray into both nostrils daily. 12/15/19   Glenford Bayley, NP  fluticasone-salmeterol (ADVAIR HFA) [phone] MCG/ACT inhaler Inhale 1 puff into the lungs 2 (two) times daily. 12/15/19   Glenford Bayley, NP    Allergies    Oxycodone-acetaminophen and Oxycodone  Review of Systems   Review of Systems  All other systems reviewed and are negative.   Physical Exam Updated Vital Signs BP (!) 129/111 (BP Location: Left Arm)   Pulse 95   Temp 98.4 F (36.9 C) (Oral)   Resp 18   Ht 5' 6.5" (1.689 m)   Wt 119.3 kg   SpO2 93%   BMI 41.81 kg/m   Physical Exam Vitals and nursing note reviewed.  Constitutional:      General: She is not in acute distress.    Appearance: Normal appearance.  HENT:     Head: Normocephalic.  Eyes:     Pupils: Pupils are equal, round, and reactive to light.  Cardiovascular:     Rate and Rhythm: Normal rate.     Pulses: Normal pulses.  Pulmonary:     Effort: Pulmonary effort is normal.  Musculoskeletal:        General: Tenderness and signs of injury present.       Hands:     Cervical back: Normal range of motion.  Skin:    General: Skin is warm.  Neurological:     Mental Status: She is alert. Mental status is at baseline.  Psychiatric:        Mood and Affect: Mood normal.        Behavior: Behavior normal.      ED Results / Procedures / Treatments   Labs (all labs ordered are listed, but only abnormal results are displayed) Labs Reviewed - No data to display  EKG None  Radiology DG Hand Complete Right  Result Date: 04/26/2020 CLINICAL DATA:  Right hand pain. EXAM: RIGHT HAND - COMPLETE 3+ VIEW COMPARISON:  None. FINDINGS: There is no evidence of fracture or dislocation. There is no evidence of arthropathy or other focal bone abnormality. Soft tissues are unremarkable. IMPRESSION: Negative. Electronically Signed   By: Lupita Raider M.D.   On: 04/26/2020 15:13     Procedures Procedures   Medications Ordered in ED Medications - No data to display  ED Course  I have reviewed the triage vital signs and the nursing notes.  Pertinent labs & imaging results that were available during my care of the  patient were reviewed by me and considered in my medical decision making (see chart for details).    MDM Rules/Calculators/A&P                          Patient presenting today with a trip and fall and injury to the right hand.  Patient is right-handed and is having decreased mobility of her third finger and wrist due to the fall.  There is significant swelling but no snuffbox tenderness concerning for scaphoid fracture.  She has no elbow or shoulder tenderness.  X-ray is negative for acute fracture.  Patient placed in wrist splint and given follow-up with sports medicine if symptoms are not improving.  She is going to ice and use Tylenol as needed  MDM Number of Diagnoses or Management Options   Amount and/or Complexity of Data Reviewed Tests in the radiology section of CPT: ordered and reviewed Independent visualization of images, tracings, or specimens: yes   Final Clinical Impression(s) / ED Diagnoses Final diagnoses:  Contusion of right hand, initial encounter  Sprain of right wrist, initial encounter    Rx / DC Orders ED Discharge Orders    None       Gwyneth Sprout, MD 04/26/20 1547

## 2020-04-26 NOTE — Telephone Encounter (Signed)
ATC patient to get appointment reschedule to after 5/12 [facility] x2

## 2020-05-04 ENCOUNTER — Other Ambulatory Visit: Payer: Self-pay

## 2020-05-04 ENCOUNTER — Ambulatory Visit (INDEPENDENT_AMBULATORY_CARE_PROVIDER_SITE_OTHER): Payer: Medicaid Other | Admitting: Family Medicine

## 2020-05-04 ENCOUNTER — Ambulatory Visit: Payer: Self-pay

## 2020-05-04 ENCOUNTER — Encounter: Payer: Self-pay | Admitting: Family Medicine

## 2020-05-04 VITALS — BP 120/90 | Ht 66.5 in | Wt 263.0 lb

## 2020-05-04 DIAGNOSIS — M79644 Pain in right finger(s): Secondary | ICD-10-CM

## 2020-05-04 DIAGNOSIS — S63439A Traumatic rupture of volar plate of unspecified finger at metacarpophalangeal and interphalangeal joint, initial encounter: Secondary | ICD-10-CM | POA: Insufficient documentation

## 2020-05-04 DIAGNOSIS — S63658A Sprain of metacarpophalangeal joint of other finger, initial encounter: Secondary | ICD-10-CM

## 2020-05-04 DIAGNOSIS — S63619A Unspecified sprain of unspecified finger, initial encounter: Secondary | ICD-10-CM | POA: Insufficient documentation

## 2020-05-04 NOTE — Patient Instructions (Signed)
Nice to meet you Please try ice as needed  Please use the splint when active and at night   Please send me a message in MyChart with any questions or updates.  Please see me back in 1 week.   --Dr. Jordan Likes

## 2020-05-04 NOTE — Assessment & Plan Note (Signed)
Having changes around the capsule since the fall.   -Counseled on supportive care. -Splint placed today. -Could consider further imaging

## 2020-05-04 NOTE — Progress Notes (Signed)
  Maria Bradford - 40 y.o. female MRN [id number]  Date of birth: 07/04/1980  SUBJECTIVE:  Including CC & ROS.  No chief complaint on file.   Maria Bradford is a 40 y.o. female that is presenting with right hand pain.  Pain is occurring at the third metacarpal joint.  She had a fall a few days ago.  Was placed in a splint.  Having worsening pain..  Independent review of the right hand x-ray from 4/17 shows no acute changes.   Review of Systems See HPI   HISTORY: Past Medical, Surgical, Social, and Family History Reviewed & Updated per EMR.   Pertinent Historical Findings include:  Past Medical History:  Diagnosis Date  . Coronary artery disease   . Hypertension     Past Surgical History:  Procedure Laterality Date  . CESAREAN SECTION    . CORONARY ANGIOPLASTY WITH STENT PLACEMENT    . TUBAL LIGATION      History reviewed. No pertinent family history.  Social History   Socioeconomic History  . Marital status: Single    Spouse name: Not on file  . Number of children: Not on file  . Years of education: Not on file  . Highest education level: Not on file  Occupational History  . Not on file  Tobacco Use  . Smoking status: Never Smoker  . Smokeless tobacco: Never Used  Vaping Use  . Vaping Use: Never used  Substance and Sexual Activity  . Alcohol use: No  . Drug use: No  . Sexual activity: Yes    Birth control/protection: Surgical  Other Topics Concern  . Not on file  Social History Narrative  . Not on file   Social Determinants of Health   Financial Resource Strain: Not on file  Food Insecurity: Not on file  Transportation Needs: Not on file  Physical Activity: Not on file  Stress: Not on file  Social Connections: Not on file  Intimate Partner Violence: Not on file     PHYSICAL EXAM:  VS: BP 120/90 (BP Location: Left Arm, Patient Position: Sitting, Cuff Size: Large)   Ht 5' 6.5" (1.689 m)   Wt 263 lb (119.3 kg)   BMI 41.81 kg/m   Physical Exam Gen: NAD, alert, cooperative with exam, well-appearing MSK:  Right hand: Tenderness palpation over the third metacarpal joint. No malrotation or misalignment. Normal range of motion of the wrist. Neurovascular intact  Limited ultrasound: Right hand:  Increased hyperemia on short axis appreciated at the third MCP joint. No changes of the third MCP joint on the long axis and no changes of the PIP joint. Appears to have some opening of the dynamic testing of the third MCP joint.  Summary: Findings would suggest capsular sprain of the third MCP joint  Ultrasound and interpretation by Clare Gandy, MD  1. Hand/figners  2. right 3. Short arm radial splint 4. Ortho-glass 5. Applied by Dr. Jordan Likes    ASSESSMENT & PLAN:   Sprain of metacarpophalangeal (MCP) joint of middle finger, initial encounter Having changes around the capsule since the fall.   -Counseled on supportive care. -Splint placed today. -Could consider further imaging

## 2020-05-11 ENCOUNTER — Ambulatory Visit: Payer: Medicaid Other | Admitting: Family Medicine

## 2020-05-11 NOTE — Progress Notes (Deleted)
  Maria Bradford - 40 y.o. female MRN [id number]  Date of birth: 1980-07-05  SUBJECTIVE:  Including CC & ROS.  No chief complaint on file.   Maria Bradford is a 40 y.o. female that is  ***.  ***   Review of Systems See HPI   HISTORY: Past Medical, Surgical, Social, and Family History Reviewed & Updated per EMR.   Pertinent Historical Findings include:  Past Medical History:  Diagnosis Date  . Coronary artery disease   . Hypertension     Past Surgical History:  Procedure Laterality Date  . CESAREAN SECTION    . CORONARY ANGIOPLASTY WITH STENT PLACEMENT    . TUBAL LIGATION      No family history on file.  Social History   Socioeconomic History  . Marital status: Single    Spouse name: Not on file  . Number of children: Not on file  . Years of education: Not on file  . Highest education level: Not on file  Occupational History  . Not on file  Tobacco Use  . Smoking status: Never Smoker  . Smokeless tobacco: Never Used  Vaping Use  . Vaping Use: Never used  Substance and Sexual Activity  . Alcohol use: No  . Drug use: No  . Sexual activity: Yes    Birth control/protection: Surgical  Other Topics Concern  . Not on file  Social History Narrative  . Not on file   Social Determinants of Health   Financial Resource Strain: Not on file  Food Insecurity: Not on file  Transportation Needs: Not on file  Physical Activity: Not on file  Stress: Not on file  Social Connections: Not on file  Intimate Partner Violence: Not on file     PHYSICAL EXAM:  VS: There were no vitals taken for this visit. Physical Exam Gen: NAD, alert, cooperative with exam, well-appearing MSK:  ***      ASSESSMENT & PLAN:   No problem-specific Assessment & Plan notes found for this encounter.

## 2020-05-20 ENCOUNTER — Ambulatory Visit (HOSPITAL_BASED_OUTPATIENT_CLINIC_OR_DEPARTMENT_OTHER): Payer: Medicaid Other | Attending: Internal Medicine | Admitting: Internal Medicine

## 2020-05-20 ENCOUNTER — Ambulatory Visit (INDEPENDENT_AMBULATORY_CARE_PROVIDER_SITE_OTHER): Payer: Medicaid Other | Admitting: Family Medicine

## 2020-05-20 ENCOUNTER — Other Ambulatory Visit: Payer: Self-pay

## 2020-05-20 ENCOUNTER — Encounter: Payer: Self-pay | Admitting: Family Medicine

## 2020-05-20 DIAGNOSIS — S63439D Traumatic rupture of volar plate of unspecified finger at metacarpophalangeal and interphalangeal joint, subsequent encounter: Secondary | ICD-10-CM | POA: Diagnosis present

## 2020-05-20 NOTE — Patient Instructions (Signed)
Good to see you Please try the buddy tape for comfort  Please call [phone] to schedule the MRI  Please send me a message in MyChart with any questions or updates.  We will setup a virtual visit once the MRI is resulted.   --Dr. Jordan Likes

## 2020-05-20 NOTE — Assessment & Plan Note (Addendum)
Initial injury was on 4/18.  Still having lack of flexion and significant pain.  Concern for volar plate rupture given her inability to flex her finger. -Counseled on home exercise therapy and supportive therapy -Counseled on buddy taping. -MR arthrogram of the interphalangeal joint of the third digit of the right hand to evaluate for volar plate tear

## 2020-05-20 NOTE — Progress Notes (Signed)
  Maria Bradford - 40 y.o. female MRN [id number]  Date of birth: 03-24-1980  SUBJECTIVE:  Including CC & ROS.  No chief complaint on file.   Maria Bradford is a 40 y.o. female that is presenting with worsening of her third middle finger pain.  The pain is occurring over the interphalangeal joint of the third digit.  She still cannot flex her finger since her trauma.   Review of Systems See HPI   HISTORY: Past Medical, Surgical, Social, and Family History Reviewed & Updated per EMR.   Pertinent Historical Findings include:  Past Medical History:  Diagnosis Date  . Coronary artery disease   . Hypertension     Past Surgical History:  Procedure Laterality Date  . CESAREAN SECTION    . CORONARY ANGIOPLASTY WITH STENT PLACEMENT    . TUBAL LIGATION      History reviewed. No pertinent family history.  Social History   Socioeconomic History  . Marital status: Single    Spouse name: Not on file  . Number of children: Not on file  . Years of education: Not on file  . Highest education level: Not on file  Occupational History  . Not on file  Tobacco Use  . Smoking status: Never Smoker  . Smokeless tobacco: Never Used  Vaping Use  . Vaping Use: Never used  Substance and Sexual Activity  . Alcohol use: No  . Drug use: No  . Sexual activity: Yes    Birth control/protection: Surgical  Other Topics Concern  . Not on file  Social History Narrative  . Not on file   Social Determinants of Health   Financial Resource Strain: Not on file  Food Insecurity: Not on file  Transportation Needs: Not on file  Physical Activity: Not on file  Stress: Not on file  Social Connections: Not on file  Intimate Partner Violence: Not on file     PHYSICAL EXAM:  VS: BP 94/68 (BP Location: Left Arm, Patient Position: Sitting, Cuff Size: Large)   Ht 5' 6.5" (1.689 m)   Wt 263 lb (119.3 kg)   BMI 41.81 kg/m  Physical Exam Gen: NAD, alert, cooperative with exam,  well-appearing MSK:  Right middle finger: Unable to flex actively or passively at the IP joint. Able to extend fully. Instability at the interphalangeal joint. Neurovascular intact   ASSESSMENT & PLAN:   Traumatic rupture of volar plate of finger Initial injury was on 4/18.  Still having lack of flexion and significant pain.  Concern for volar plate rupture given her inability to flex her finger. -Counseled on home exercise therapy and supportive therapy -Counseled on buddy taping. -MR arthrogram of the interphalangeal joint of the third digit of the right hand to evaluate for volar plate tear

## 2020-05-24 NOTE — Telephone Encounter (Signed)
Patient no showed for sleep study and does not have follow up appointment scheduled and has not called the office back. Will close this encounter.

## 2020-05-25 ENCOUNTER — Telehealth: Payer: Self-pay | Admitting: Family Medicine

## 2020-05-25 ENCOUNTER — Other Ambulatory Visit: Payer: Self-pay | Admitting: Family Medicine

## 2020-05-25 DIAGNOSIS — S63439D Traumatic rupture of volar plate of unspecified finger at metacarpophalangeal and interphalangeal joint, subsequent encounter: Secondary | ICD-10-CM

## 2020-05-25 NOTE — Telephone Encounter (Signed)
Patient states the soonest MRI June the 1st, please advise. Thanks

## 2020-06-09 ENCOUNTER — Ambulatory Visit
Admission: RE | Admit: 2020-06-09 | Discharge: 2020-06-09 | Disposition: A | Discharge status: Home / Self Care | Payer: Medicaid Other | Source: Ambulatory Visit | Attending: Family Medicine | Admitting: Family Medicine

## 2020-06-09 ENCOUNTER — Other Ambulatory Visit: Payer: Self-pay

## 2020-06-09 DIAGNOSIS — S63439D Traumatic rupture of volar plate of unspecified finger at metacarpophalangeal and interphalangeal joint, subsequent encounter: Secondary | ICD-10-CM

## 2020-06-09 MED ORDER — IOPAMIDOL (ISOVUE-M 200) INJECTION 41%
1.0000 mL | Freq: Once | INTRAMUSCULAR | Status: AC
  Administered 2020-06-09: 1 mL via INTRA_ARTICULAR

## 2020-06-11 ENCOUNTER — Other Ambulatory Visit: Payer: Self-pay

## 2020-06-11 ENCOUNTER — Telehealth (INDEPENDENT_AMBULATORY_CARE_PROVIDER_SITE_OTHER): Payer: Medicaid Other | Admitting: Family Medicine

## 2020-06-11 DIAGNOSIS — S63632D Sprain of interphalangeal joint of right middle finger, subsequent encounter: Secondary | ICD-10-CM | POA: Diagnosis present

## 2020-06-11 NOTE — Assessment & Plan Note (Signed)
Initial injury on 4/18.  Still has trouble making a fist MRI was revealing for sprain and no structural ligament changes.  Does have what appears to be a bony contusion as well. -Counseled on home exercise therapy and supportive care. -Referral to physical therapy. -Could consider injection.

## 2020-06-11 NOTE — Progress Notes (Signed)
Virtual Visit via Video Note  I connected with Maria Bradford on 06/11/20 at  8:00 AM EDT by a video enabled telemedicine application and verified that I am speaking with the correct person using two identifiers.  Location: Patient: home Provider: office   I discussed the limitations of evaluation and management by telemedicine and the availability of in person appointments. The patient expressed understanding and agreed to proceed.  History of Present Illness:  Ms. Maria Bradford is a 40 year old female swelling up after the MRI of her right finger.  She is still unable to make a fist.   Observations/Objective:  Gen: NAD, alert, cooperative with exam, well-appearing   Assessment and Plan:  Finger sprain, Initial injury on 4/18.  Still has trouble making a fist MRI was revealing for sprain and no structural ligament changes.  Does have what appears to be a bony contusion as well. -Counseled on home exercise therapy and supportive care. -Referral to physical therapy. -Could consider injection.  Follow Up Instructions:    I discussed the assessment and treatment plan with the patient. The patient was provided an opportunity to ask questions and all were answered. The patient agreed with the plan and demonstrated an understanding of the instructions.   The patient was advised to call back or seek an in-person evaluation if the symptoms worsen or if the condition fails to improve as anticipated.   Maria Gandy, MD

## 2020-06-30 ENCOUNTER — Ambulatory Visit: Payer: Medicaid Other | Admitting: Occupational Therapy

## 2020-06-30 ENCOUNTER — Ambulatory Visit: Payer: Medicaid Other

## 2020-07-01 ENCOUNTER — Encounter: Payer: Self-pay | Admitting: Occupational Therapy

## 2020-07-01 ENCOUNTER — Ambulatory Visit: Payer: Medicaid Other | Attending: Family Medicine | Admitting: Occupational Therapy

## 2020-07-01 ENCOUNTER — Other Ambulatory Visit: Payer: Self-pay

## 2020-07-01 DIAGNOSIS — M6281 Muscle weakness (generalized): Secondary | ICD-10-CM | POA: Diagnosis present

## 2020-07-01 DIAGNOSIS — M79644 Pain in right finger(s): Secondary | ICD-10-CM | POA: Insufficient documentation

## 2020-07-01 DIAGNOSIS — M25541 Pain in joints of right hand: Secondary | ICD-10-CM

## 2020-07-01 DIAGNOSIS — R278 Other lack of coordination: Secondary | ICD-10-CM | POA: Insufficient documentation

## 2020-07-04 NOTE — Therapy (Signed)
[facility] [address] [place], [place], [address] Phone: [phone]   Fax:  [phone]  Occupational Therapy Evaluation  Patient Details  Name: Cortny Bambach MRN: [id number] Date of Birth: 1980-07-19 No data recorded  Encounter Date: 07/01/2020  OT End of Session - 07/01/20 1532 Visit Number 1  Number of Visits 7  Date for OT Re-Evaluation 09/29/2020  Authorization Type [place] Medicaid Prepaid Plan  Authorization Time Period VL: 12 then auth required  Progress Note Due on Visit 10  OT Start Time 1530  OT Stop Time 1615  OT Time Calculation (min) 45 min  Behavior During Therapy WFL for tasks assessed/performed    Past Medical History:  Diagnosis Date   Coronary artery disease    Hypertension     Past Surgical History:  Procedure Laterality Date   CESAREAN SECTION     CORONARY ANGIOPLASTY WITH STENT PLACEMENT     TUBAL LIGATION      There were no vitals filed for this visit.   Subjective Assessment - 07/01/20 1534 Subjective  Pt arrives to evaluation session today w/ primary concerns related to persistent pain and decreased functional use of her R, dominant hand 2/2 likely capsular sprain of her long finger incurred when she fell over a tree stump on 04/25/20. Pt reports consistent pain w/ movement, decreased ROM, as well as decreased strength and that her symptoms are very limiting during functional activities throughout the day. She has tried buddy taping and also states that her physician has recommended considering an injection, but that she is hesitant to try this.  Pertinent History R hand capsular sprain of 3rd MCP joint; PMHx includes CAD, HTN, HLD, hx of MI  Patient Stated Goals Write, make a fist; build up strength (e.g. get a pan out of the oven w/out dropping it)  Currently in Pain? Yes  Pain Score 5   Pain Location Finger (Comment which one) - 3rd (long)  Pain Orientation Right  Pain Descriptors /  Indicators Sore; Pins and needles  Pain Type From MCP (palmar and dorsal) to fingertip  Pain Onset More than a month ago  Pain Frequency Constant (Level of pain fluctuates)  Aggravating Factors Movement; lifting     [facility] OT Assessment - 07/01/20 1538 Assessment    Medical Diagnosis  R hand capsular sprain of 3rd MCP joint   Referring Provider (OT) Clare Gandy, MD  Onset Date/Surgical Date 04/25/2020  Hand Dominance Right     Precautions   Required Braces or Orthoses Other Brace/Splint  Other Brace/Splint Buddy strap/taping long and index fingers at night     Home Environment   Type of Home House  Home Layout One level  Lives With Family - 2 children (16 and 36 y.o.)     Prior Function   Level of Independence Independent  Vocation Full time employment  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; 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)  Leisure [employer]     ADL   Eating/Feeding Grooming Engineer, manufacturing systems Modified independent (Requires extra time to cut through tougher foods) Modified independent (Difficulty w/ ringing and holding washcloths) Modified independent Modified independent Needs assist for fasteners Needs assist for fasteners Independent Modified independent Modified independent Independent  ADL comments Difficulty w/ BADL tasks primarily due to pain and decreased functional grasp     IADL   Prior Level  of Function Light Housekeeping Independent  Light Housekeeping Performs light daily tasks but cannot maintain acceptable level of cleanliness  Prior Level of Functional Meal Prep Independent  Meal Prep Plans, prepares and serves adequate meals independently (Requires assist w/ heavy pots/pans/dishes)  Training and development officer own vehicle   Written Expression    Dominant Hand Right  Handwriting Increased time (Pt  reports decreased penmanship due to decreased grasp on writing implement)  Written Experience [facility]     Observation/Other Assessments   Observations Edema in region of MCP, proximal phalanx, PIP of 3rd; observed guarding of R hand/digits     Sensation   Light Touch Impaired by gross assessment - Reports increased pins and needles (sensitivity)  Stereognosis Appears Intact  Hot/Cold Appears Intact (per pt report)     Coordination   Gross Motor Movements are Fluid and Coordinated Yes  Fine Motor Movements are Fluid and Coordinated Yes     Edema   Edema Long finger proximal phalanx R 7.2 cm; L 6.8 cm     ROM / Strength   AROM / PROM / Strength AROM   Palpation   Palpation comment Tenderness to palpation, primarily in region of proximal phalanx     AROM   Overall AROM Comments Bilateral shoulder, elbow, wrist WFL     Right Hand AROM   R Long MCP 0-90 35  R Long PIP 0-100 68  R Long DIP 0-70 32     Left Hand AROM   L Long MCP 0-90 73  L Long PIP 0-100 108  L Long DIP 0-70 36     Hand Function   Right Hand Gross Grasp Impaired (Reports feeling like she is going to drop heavier weighted objects)  Right Hand Grip (lbs) 7#  Left Hand Gross Grasp Functional  Left Hand Grip (lbs) 37#    OT Education - 07/01/20 1616 Education Details Education provided on role and purpose of OT, as well as potential interventions and goals for therapy.  Person(s) Education Patient  Methods Explanation  Comprehension Verbalized understanding     OT Short Term Goals - 07/01/20 1618       OT SHORT TERM GOAL #1   Title Pt will independently verbalize at least 3 pain and/or edema management strategies to facilitate symptom managments    Baseline Decreased knowledge of edema management    Time 3    Period Weeks    Status New    Target Date 07/30/20      OT SHORT TERM GOAL #2   Title Pt will improve R long finger MCP flexion by at least 10* to facilitate functional gross grasp    Baseline R  long finger MCP flexion 35* (L MCP 73*)    Time 3    Period Weeks    Status New      OT SHORT TERM GOAL #3   Title Pt will improve R long finger PIP flexion by at least 10* to facilitate functional grasp and pinch prehension    Baseline R long finger PIP flexion 68* (L PIP 108*)    Time 3    Period Weeks    Status New              OT Long Term Goals - 07/01/20 1618       OT LONG TERM GOAL #1   Title Pt will demonstrate independence w/ full HEP designed for increased ROM, increased strength, and decreased symptoms    Baseline No formal HEP  at this time    Time 6    Period Weeks    Status New    Target Date 08/20/20      OT LONG TERM GOAL #2   Title Pt will improve R long finger MCP flexion by at least 20* to facilitate functional gross grasp    Baseline R long finger MCP flexion 35* (L MCP 73*)    Time 6    Period Weeks    Status New      OT LONG TERM GOAL #3   Title Pt will improve R long finger PIP flexion by at least 20* to facilitate functional grasp and pinch prehension    Baseline R long finger PIP flexion 68* (L PIP 108*)    Time 6    Period Weeks    Status New      OT LONG TERM GOAL #4   Title Pt will increase grip strength w/ R hand by at least 10# to improve participation in cooking tasks    Baseline R hand grasp 7# (L hand 37#)    Time 6    Period Weeks    Status New      OT LONG TERM GOAL #5   Title Pt will be able to manipulate clothing fasteners (zippers, buttons, shoelaces) w/ pain less than 2/10 in at least 3 trials    Baseline Difficulty w/ clothing manipulatives per pt report    Time 6    Period Weeks    Status New             Plan - 07/01/20 1542 Clinical Impression Statement Pt is a 40 y.o. female who presents to OP OT due to a likely R hand capsular sprain of the 3rd MCP joint which occurred during a fall sustained April 17th. PMHx includes CAD, HTN, HLD, and hx of MI. 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 Pt will benefit from skilled occupational therapy services to address strength and dexterity, ROM, pain management, compensatory strategies including AE prn, and implementation of an HEP to improve participation and decrease symptoms during ADLs and IADLs.  OT Occupational Profile and History Problem Focused Assessment - Including review of records relating to presenting problem  Occupational performance deficits (Please refer to evaluation for details): ADL's; IADL's; Leisure; Work; Rest and Sleep  Pt will benefit from skilled therapeutic intervention in order to improve on the following performance deficits Body Structure / Function / Physical Skills  Body Structure / Function / Physical Skills ADL; ROM; UE functional use; Decreased knowledge of use of DME; FMC; Body mechanics; Dexterity; GMC; Edema; Pain; Strength; IADL; Coordination  Rehab Potential Good  Clinical Decision Making Several treatment options, min-mod task modification necessary  Comorbidities Affecting Occupational Performance: May have comorbidities impacting occupational performance  Modification or Assistance to Complete Evaluation No modification of tasks or assist necessary to complete eval  OT Frequency 1x / week (May reduce frequency to 1x/every other week depending on progress)  OT Duration 6 weeks  OT Treatment/Interventions Self-care/ADL training; Electrical Stimulation; Therapeutic exercise; Moist Heat; Paraffin; Neuromuscular education; Compression bandaging; Splinting; Patient/family education; Therapeutic activities; Fluidtherapy; Cryotherapy; Ultrasound; Contrast Bath; DME and/or AE instruction; Manual Therapy; Passive range of motion  Plan Review goals w/ pt; initiate HEP designed for increased mobility; paraffin  Consulted and Agree with Plan of Care Patient    Patient will benefit from  skilled therapeutic intervention in order to  improve the following deficits and impairments:   Body Structure / Function / Physical Skills: ADL, ROM, UE functional use, Decreased knowledge of use of DME, FMC, Body mechanics, Dexterity, GMC, Edema, Pain, Strength, IADL, Coordination       Visit Diagnosis: Pain in joint of right hand  Pain in right finger(s)  Muscle weakness (generalized)  Other lack of coordination    Problem List Patient Active Problem List   Diagnosis Date Noted   Finger sprain 05/04/2020   Seasonal and perennial allergic rhinitis 01/27/2020   Restless sleeper 12/15/2019   Diastolic dysfunction 07/03/2019   Acute respiratory failure with hypoxemia (HCC) 05/21/2019   Sepsis due to pneumonia (HCC) 05/20/2019   History of COVID-19 05/20/2019   Acute respiratory failure with hypoxia (HCC) 05/20/2019   Pneumonia of both lungs due to infectious organism 05/20/2019   Mixed hyperlipidemia 05/20/2019   Essential hypertension 05/20/2019   Coronary artery disease involving native coronary artery of native heart without angina pectoris 05/20/2019   Pneumonia due to COVID-19 virus 05/05/2019   COVID-19 05/05/2019   Hypoxia      Rosie Fate, OTR/L, MSOT  07/05/2020, 9:53 AM  [facility] [address] [place], [place], [address] Phone: [phone]   Fax:  [phone]  Name: Karrin Eisenmenger MRN: [id number] Date of Birth: [birth year]

## 2020-07-08 ENCOUNTER — Ambulatory Visit: Payer: Medicaid Other | Admitting: Occupational Therapy

## 2020-09-09 ENCOUNTER — Ambulatory Visit: Payer: Medicaid Other | Admitting: Family Medicine

## 2020-09-09 NOTE — Progress Notes (Deleted)
  Maria Bradford - 40 y.o. female MRN [id number]  Date of birth: April 02, 1980  SUBJECTIVE:  Including CC & ROS.  No chief complaint on file.   Analys Maria Bradford is a 40 y.o. female that is  ***.  ***   Review of Systems See HPI   HISTORY: Past Medical, Surgical, Social, and Family History Reviewed & Updated per EMR.   Pertinent Historical Findings include:  Past Medical History:  Diagnosis Date   Coronary artery disease    Hypertension     Past Surgical History:  Procedure Laterality Date   CESAREAN SECTION     CORONARY ANGIOPLASTY WITH STENT PLACEMENT     TUBAL LIGATION      No family history on file.  Social History   Socioeconomic History   Marital status: Single    Spouse name: Not on file   Number of children: Not on file   Years of education: Not on file   Highest education level: Not on file  Occupational History   Not on file  Tobacco Use   Smoking status: Never   Smokeless tobacco: Never  Vaping Use   Vaping Use: Never used  Substance and Sexual Activity   Alcohol use: No   Drug use: No   Sexual activity: Yes    Birth control/protection: Surgical  Other Topics Concern   Not on file  Social History Narrative   Not on file   Social Determinants of Health   Financial Resource Strain: Not on file  Food Insecurity: Not on file  Transportation Needs: Not on file  Physical Activity: Not on file  Stress: Not on file  Social Connections: Not on file  Intimate Partner Violence: Not on file     PHYSICAL EXAM:  VS: There were no vitals taken for this visit. Physical Exam Gen: NAD, alert, cooperative with exam, well-appearing MSK:  ***      ASSESSMENT & PLAN:   No problem-specific Assessment & Plan notes found for this encounter.

## 2020-10-20 ENCOUNTER — Telehealth: Payer: Self-pay | Admitting: Primary Care

## 2020-10-20 NOTE — Telephone Encounter (Signed)
LMTCB   We will need to give her the number to the sleep lab to reschedule. [phone].

## 2022-04-12 IMAGING — CT CT ANGIO CHEST
2 of 7 series · 16 of 46 positions shown · IV contrast (APPLIED)
Comparison: 09/18/2016, 05/20/2019

CLINICAL DATA: XNMB2-79 pneumonia, shortness of breath

EXAM:
CT ANGIOGRAPHY CHEST WITH CONTRAST
TECHNIQUE: Multidetector CT imaging of the chest was performed using the
standard protocol during bolus administration of intravenous
contrast. Multiplanar CT image reconstructions and MIPs were
obtained to evaluate the vascular anatomy.
CONTRAST:  100mL OMNIPAQUE IOHEXOL 350 MG/ML SOLN

[Series 7: thins · axial · 0.75mm/px · z∈[+67,+288]mm · 13 of 355 slices shown]
[im 20/355  lung]
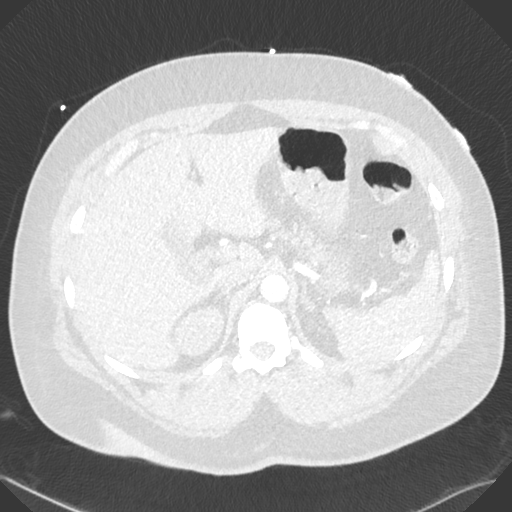
[im 40/355  soft-tissue]
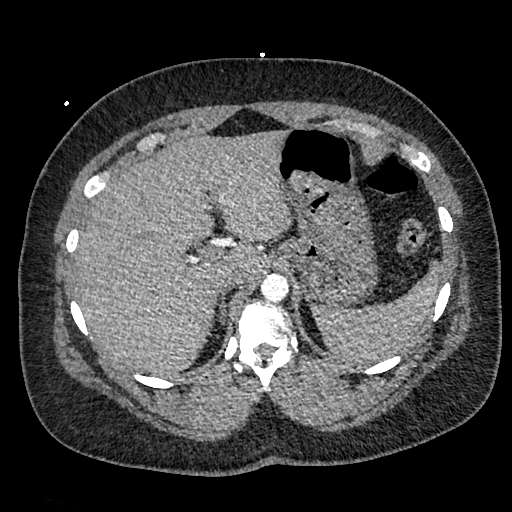
[im 79/355  lung]
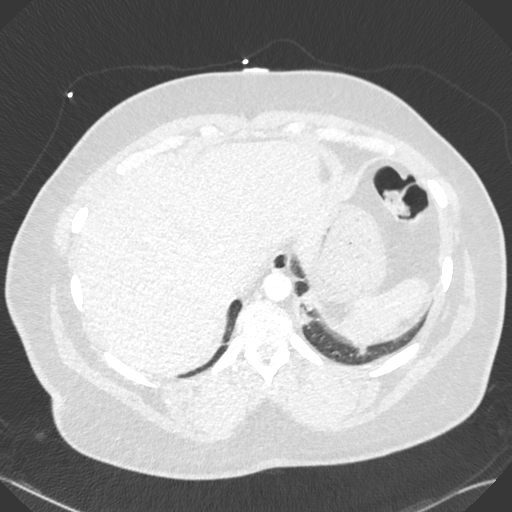
[im 99/355  soft-tissue]
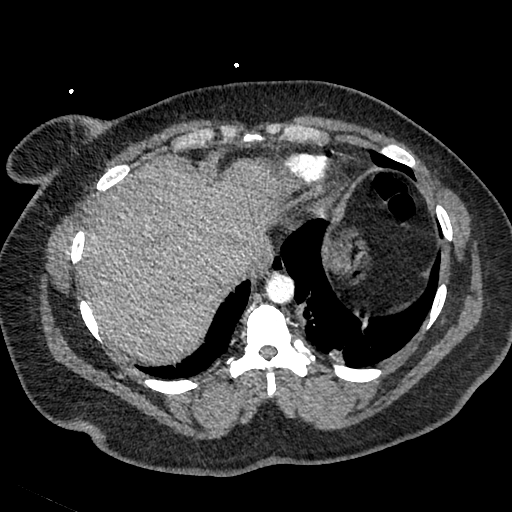
[im 119/355  lung]
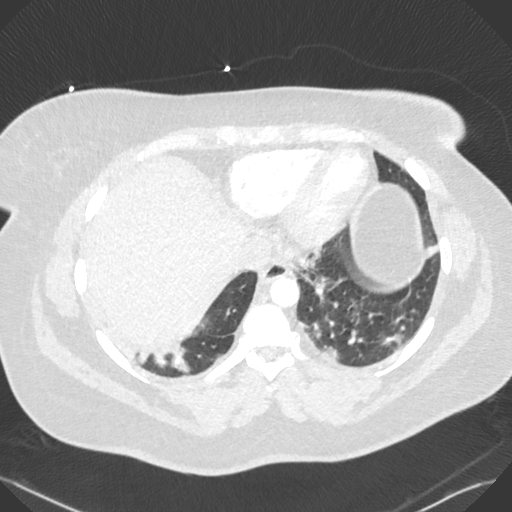
[im 158/355  soft-tissue]
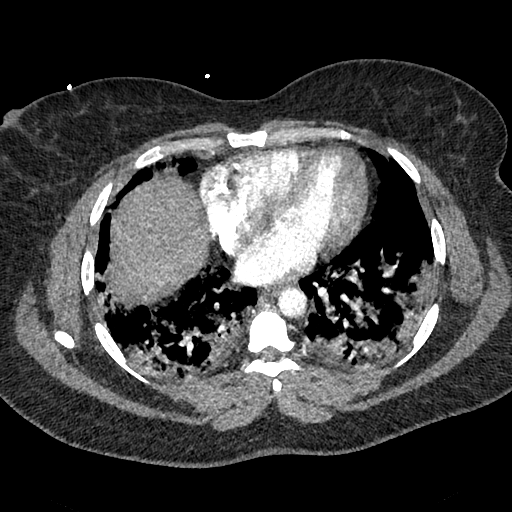
[im 178/355  lung]
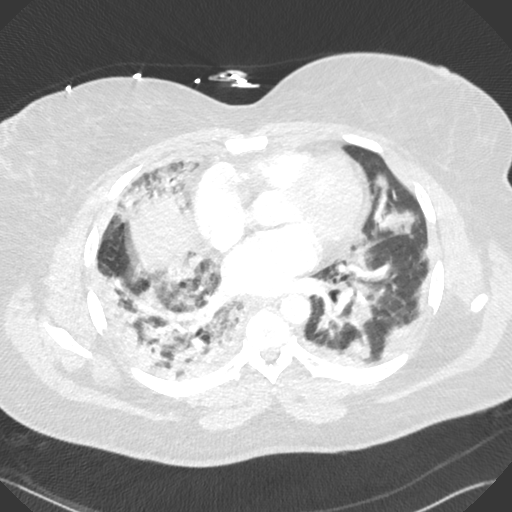
[im 197/355  soft-tissue]
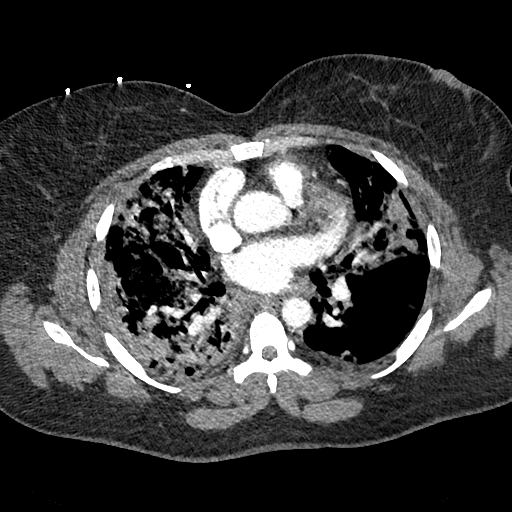
[im 237/355  lung]
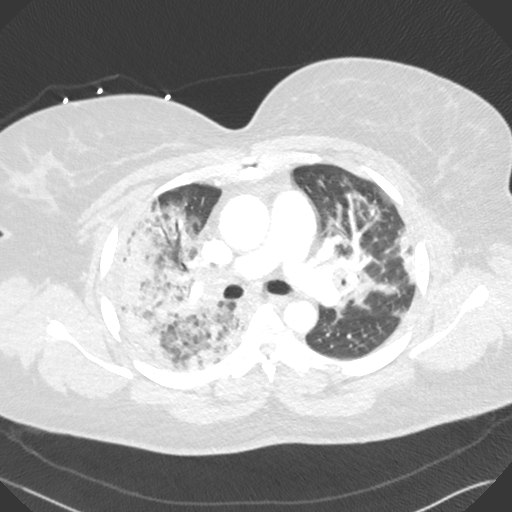
[im 256/355  soft-tissue]
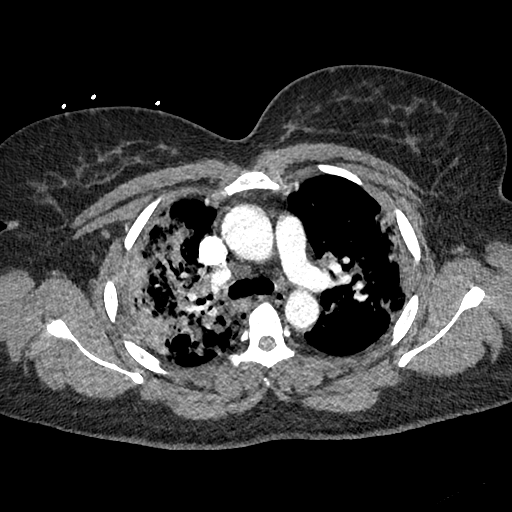
[im 276/355  lung]
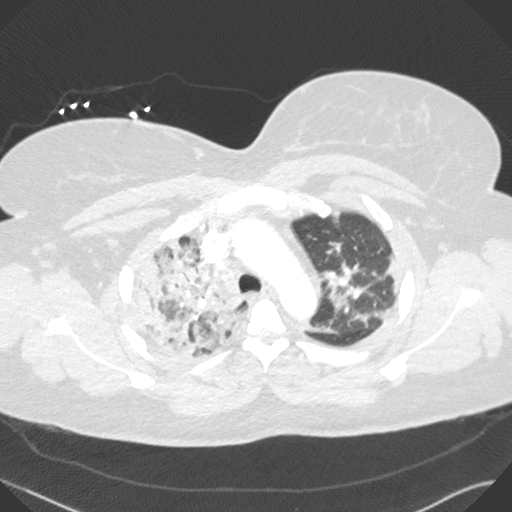
[im 315/355  soft-tissue]
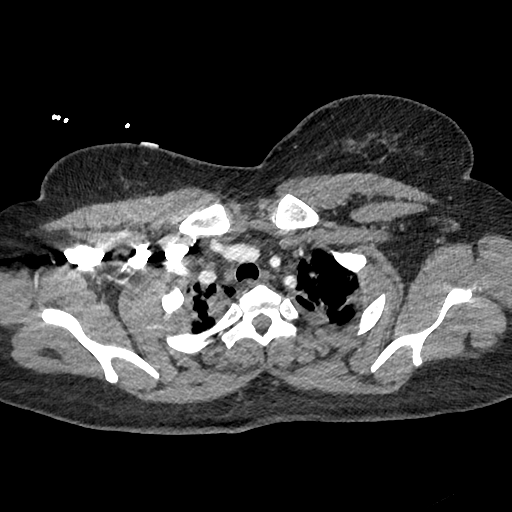
[im 335/355  lung]
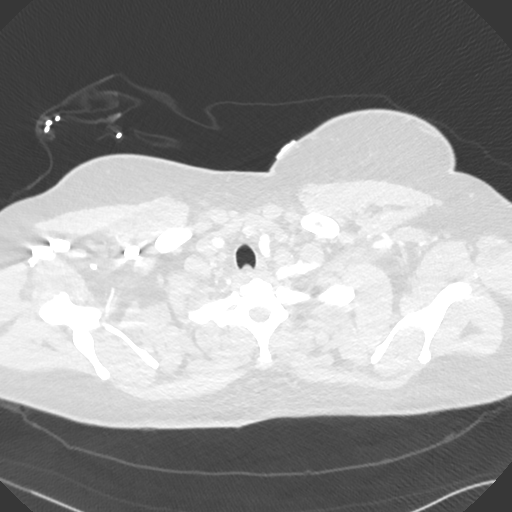

[Series 8: cor · coronal · 0.49mm/px · 3 of 160 slices shown]
[im 40/160  soft-tissue]
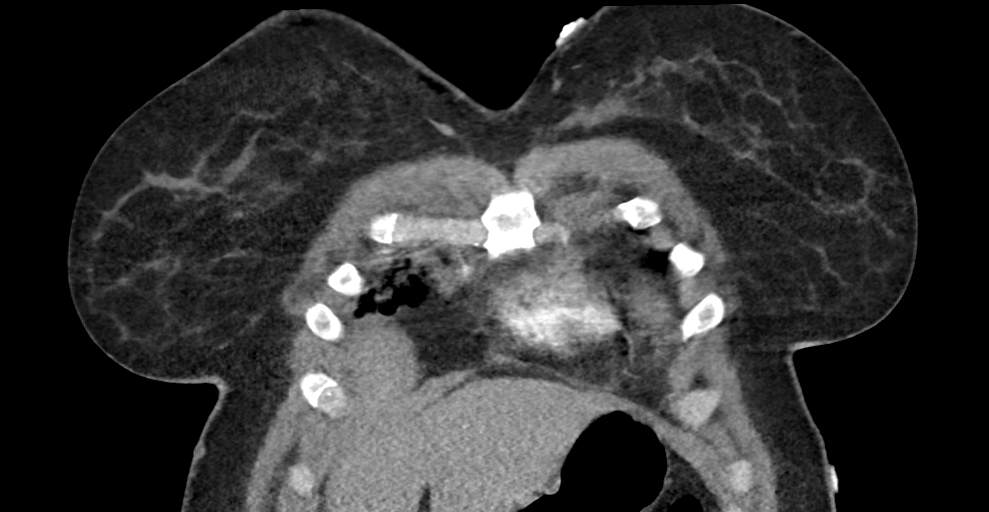
[im 80/160  soft-tissue]
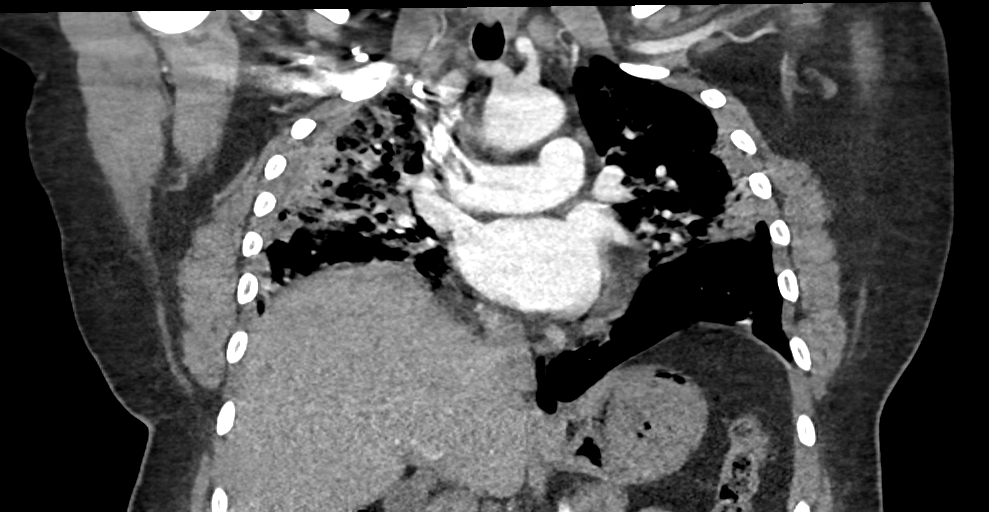
[im 120/160  soft-tissue]
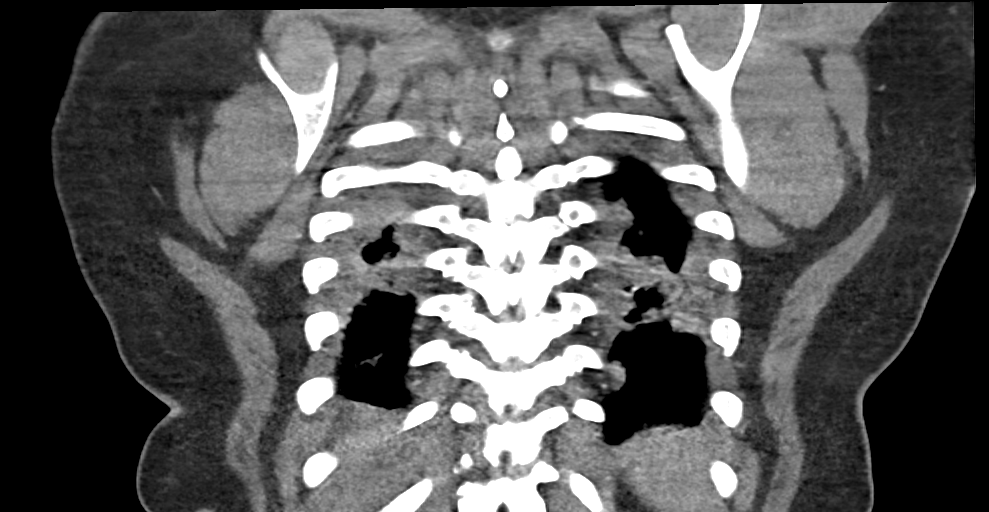

[16 of 46 positions shown; findings below may reference images not displayed]

FINDINGS: Cardiovascular: This is a technically adequate evaluation of the
pulmonary vasculature. There are no filling defects or pulmonary
emboli.

Heart is unremarkable without pericardial effusion. Thoracic aorta
is grossly normal without aneurysm or dissection.

Mediastinum/Nodes: Mediastinal and right hilar adenopathy likely
reactive. The thyroid, trachea, and esophagus are normal.

Lungs/Pleura: There is multifocal bilateral airspace disease, right
greater than left, consistent with multifocal pneumonia. No effusion
or pneumothorax. Central airways are patent.

Upper Abdomen: No acute abnormality.

Musculoskeletal: No acute or destructive bony lesions. Reconstructed
images demonstrate no additional findings.

Review of the MIP images confirms the above findings.
IMPRESSION: 1. No evidence of pulmonary embolus.
2. Multifocal bilateral pneumonia consistent with XNMB2-79.

## 2022-04-13 IMAGING — DX DG CHEST 1V
1 series · 1 of 1 positions shown · non-contrast
Comparison: 05/20/2019

CLINICAL DATA: DSRJS-U7 pneumonia, shortness of breath

EXAM:
CHEST  1 VIEW

[chest]
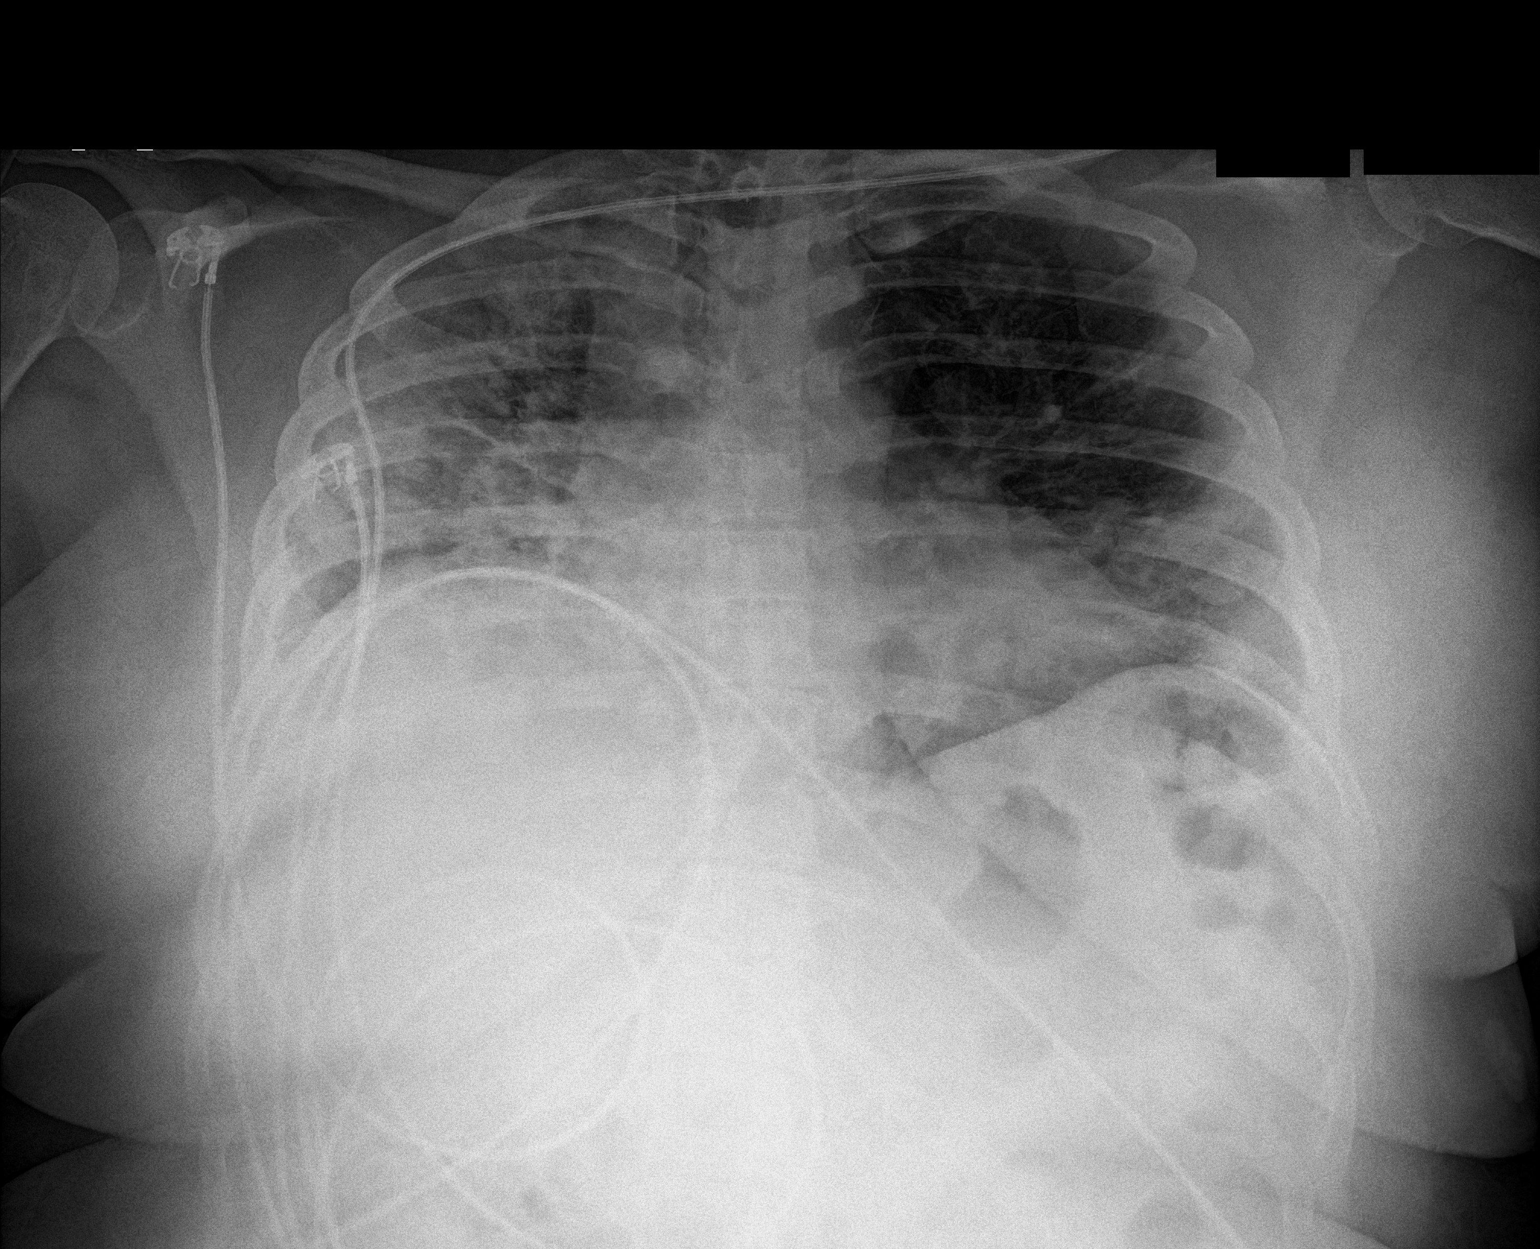

[1 of 1 positions shown; findings below may reference images not displayed]

FINDINGS: Single frontal view of the chest demonstrates stable multifocal
bilateral pneumonia. No effusion or pneumothorax. Cardiac silhouette
is stable.
IMPRESSION: 1. Stable bilateral multifocal pneumonia.

## 2022-04-24 ENCOUNTER — Encounter: Payer: Self-pay | Admitting: *Deleted

## 2022-05-31 NOTE — Progress Notes (Signed)
HPI F never smoker followed for Asthma, Acute respiratory failure with hypoxia after COVID 2021, Allergic Rhinitis, Bronchitis, complicated by  HTN, CAD, NSTEMI/ stents,  Gr2 Diastolic Dysfunction, CKD2, Hyperlipidemia, Pneumonia d/t COVID-19, Depression/ Anxiety, Allergic Rhinitis,  PFT 08/26/19- WNL  ====================================================================  01/27/20- 39 yoF never smoker for sleep evaluation courtesy of Buelah Manis, NP with concern of OSA.due to obesity and + fam hx.  Medical hx includes HTN, CAD, NSTEMI/ stents,  Gr2 Diastolic Dysfunction, CKD2, Hyperlipidemia, Pneumonia d/t COVID-19, acute respiratory failure, Depression/ Anxiety, Allergic Rhinitis, Asthma Seen by Clent Ridges, NP 12/6 f/u post-viral infiltrates after Covid pneumonia May, 2021. Noting seasonal allergic rhinitis. Infiltrates and hypoxia had resolved.  Epworth score- 14 Body weight today- 252 lbs Covid vax- 2 Phizer Flu vax- had -----Snoring, witnessed apneas  Awareness of loud snoring, long predates Covid infection. Witnessed apneas. Daytime tiredness, some drowsy driving- discussed.  Occasional Tylenol PM for sleep. Sleep is fragmented, frequent waking, bathroom trips. Daytime naps. 1 cup coffee. Brother uses CPAP. Nasal congestion- allergy- uses Flonase and denies additional help now.  CXR 12/15/19-  IMPRESSION: No acute cardiopulmonary disease. Resolution of patchy opacities seen on previous imaging.  06/01/22- 42 yoF never smoker followed for Acute respiratory failure with hypoxia after COVID 2021, Allergic Rhinitis, Asthmatic Bronchitis, complicated by HTN, CAD, NSTEMI/ stents,  Gr2 Diastolic Dysfunction, CKD2, Hyperlipidemia, Pneumonia d/t COVID-19, Depression/ Anxiety, Allergic Rhinitis,.  -Ventolin hfa, Advair hfa 115, zoloft, claritin,  Body weight today ED 05/12/22- Bronchitis> augmentin, Zith, prednisone, tessalon perles PFT 08/26/19- WNL Reports sleep study/[facility] in September, 2023 was  normal. ECHO 2021 negative for PHTN, EF 65-70%.  Pending follow-up with her cardiologist in [facility] in July. Walk Test room air %/23/24-3 laps, maximum HR 109, minimum O2 sat 92%.  ROS-see HPI   + = positive Constitutional:    weight loss, night sweats, fevers, chills, +fatigue, lassitude. HEENT:    headaches, difficulty swallowing, tooth/dental problems, sore throat,       sneezing, itching, ear ache, +nasal congestion, post nasal drip, snoring CV:    chest pain, orthopnea, PND, swelling in lower extremities, anasarca,                                  dizziness, palpitations Resp:   shortness of breath with exertion or at rest.                productive cough,   non-productive cough, coughing up of blood.              change in color of mucus.  wheezing.   Skin:    rash or lesions. GI:  No-   heartburn, indigestion, abdominal pain, nausea, vomiting, diarrhea,                 change in bowel habits, loss of appetite GU: dysuria, change in color of urine, no urgency or frequency.   flank pain. MS:   joint pain, stiffness, decreased range of motion, back pain. Neuro-     nothing unusual Psych:  change in mood or affect.  depression or anxiety.   memory loss.  OBJ- Physical Exam General- Alert, Oriented, Affect-appropriate, Distress- none acute, + tall/ big/obese Skin- rash-none, lesions- none, excoriation- none Lymphadenopathy- none Head- atraumatic            Eyes- Gross vision intact, PERRLA, conjunctivae and secretions clear  Ears- Hearing, canals-normal            Nose- + snorting and sniffing no-Septal dev, mucus, polyps, erosion, perforation             Throat- Mallampati II-III , mucosa clear , drainage- none, tonsils+ moderate, + teeth Neck- flexible , trachea midline, no stridor , thyroid nl, carotid no bruit Chest - symmetrical excursion , unlabored           Heart/CV- RRR , no murmur , no gallop  , no rub, nl s1 s2                           - JVD- none , edema-  none, stasis changes- none, varices- none           Lung-wheeze- none, cough+active , dullness-none, rub- none           Chest wall-  Abd-  Br/ Gen/ Rectal- Not done, not indicated Extrem- cyanosis- none, clubbing, none, atrophy- none, strength- nl Neuro- grossly intact to observation

## 2022-06-01 ENCOUNTER — Encounter: Payer: Self-pay | Admitting: Internal Medicine

## 2022-06-01 ENCOUNTER — Ambulatory Visit (INDEPENDENT_AMBULATORY_CARE_PROVIDER_SITE_OTHER): Payer: Medicaid Other | Admitting: Internal Medicine

## 2022-06-01 VITALS — BP 122/82 | HR 98 | Ht 66.5 in | Wt 270.8 lb

## 2022-06-01 DIAGNOSIS — R0902 Hypoxemia: Secondary | ICD-10-CM | POA: Diagnosis not present

## 2022-06-01 DIAGNOSIS — R0609 Other forms of dyspnea: Secondary | ICD-10-CM | POA: Diagnosis not present

## 2022-06-01 DIAGNOSIS — J4531 Mild persistent asthma with (acute) exacerbation: Secondary | ICD-10-CM

## 2022-06-01 LAB — BASIC METABOLIC PANEL
BUN: 9 mg/dL (ref 6–23)
CO2: 26 mEq/L (ref 19–32)
Calcium: 9.6 mg/dL (ref 8.4–10.5)
Chloride: 108 mEq/L (ref 96–112)
Creatinine, Ser: 1.35 mg/dL — ABNORMAL HIGH (ref 0.40–1.20)
GFR: 47.86 mL/min — ABNORMAL LOW (ref >60.00)
Glucose, Bld: 100 mg/dL — ABNORMAL HIGH (ref 70–99)
Potassium: 4.4 mEq/L (ref 3.5–5.1)
Sodium: 140 mEq/L (ref 135–145)

## 2022-06-01 LAB — BRAIN NATRIURETIC PEPTIDE: Pro B Natriuretic peptide (BNP): 13 pg/mL (ref 0.0–100.0)

## 2022-06-01 LAB — D-DIMER, QUANTITATIVE: D-Dimer, Quant: <0.19 mcg/mL FEU (ref <0.50)

## 2022-06-01 MED ORDER — BREZTRI AEROSPHERE 160-9-4.8 MCG/ACT IN AERO: 2.0000 | INHALATION_SPRAY | Freq: Two times a day (BID) | RESPIRATORY_TRACT | 0 refills | Status: DC

## 2022-06-01 NOTE — Patient Instructions (Addendum)
Order- CT chest PE protocol with contrast (CKD2)  Order- schedule PFT   dx dyspnea on exertion  Order sample Breztri inhaler   inhale 2 puffs then rinse mouth, twice daily  Order- schedule overnight oximetry on room air    dx dyspnea on exertion  Order- walk test on room air    dx dyspnea on exertion  Order lab- D-dimer, BNP, BMET    dx dyspnea on exertion

## 2022-06-09 MED ORDER — ALPRAZOLAM 0.25 MG PO TABS: ORAL_TABLET | ORAL | 0 refills | Status: DC

## 2022-06-09 NOTE — Telephone Encounter (Signed)
I sent two tablets of xanax to her drug store in [facility] to take ahead of procedure if needed

## 2022-06-12 ENCOUNTER — Ambulatory Visit (HOSPITAL_BASED_OUTPATIENT_CLINIC_OR_DEPARTMENT_OTHER)
Admission: RE | Admit: 2022-06-12 | Discharge: 2022-06-12 | Disposition: A | Discharge status: Home / Self Care | Payer: Medicaid Other | Source: Ambulatory Visit | Attending: Internal Medicine | Admitting: Internal Medicine

## 2022-06-12 DIAGNOSIS — R0609 Other forms of dyspnea: Secondary | ICD-10-CM | POA: Diagnosis present

## 2022-06-12 MED ORDER — IOHEXOL 350 MG/ML SOLN
100.0000 mL | Freq: Once | INTRAVENOUS | Status: AC | PRN
  Administered 2022-06-12: 100 mL via INTRAVENOUS

## 2022-07-21 ENCOUNTER — Encounter: Payer: Self-pay | Admitting: Internal Medicine

## 2022-07-21 DIAGNOSIS — J4531 Mild persistent asthma with (acute) exacerbation: Secondary | ICD-10-CM | POA: Insufficient documentation

## 2022-07-21 NOTE — Assessment & Plan Note (Signed)
And-overnight oximetry

## 2022-07-21 NOTE — Assessment & Plan Note (Addendum)
To clarify status to reconcile symptoms and objective findings.  In particular want to exclude PE. Plan- CT, D-dimer, sample Breztri, update PFT

## 2022-08-30 NOTE — Progress Notes (Deleted)
HPI F never smoker followed for Asthma, Acute respiratory failure with hypoxia after COVID 2021, Allergic Rhinitis, Bronchitis, complicated by  HTN, CAD, NSTEMI/ stents,  Gr2 Diastolic Dysfunction, CKD2, Hyperlipidemia, Pneumonia d/t COVID-19, Depression/ Anxiety, Allergic Rhinitis,  PFT 08/26/19- WNL Walk Test room air %/23/24-3 laps, maximum HR 109, minimum O2 sat 92%. ====================================================================  06/01/22- Maria Bradford never smoker followed for Acute respiratory failure with hypoxia after COVID 2021, Allergic Rhinitis, Asthmatic Bronchitis, complicated by HTN, CAD, NSTEMI/ stents,  Gr2 Diastolic Dysfunction, CKD2, Hyperlipidemia, Pneumonia d/t COVID-19, Depression/ Anxiety, Allergic Rhinitis,.  -Ventolin hfa, Advair hfa 115, zoloft, claritin,  Body weight today ED 05/12/22- Bronchitis> augmentin, Zith, prednisone, tessalon perles PFT 08/26/19- WNL Reports sleep study/[facility] in September, 2023 was normal. ECHO 2021 negative for PHTN, EF 65-70%.  Pending follow-up with her cardiologist in [facility] in July. Walk Test room air %/23/24-3 laps, maximum HR 109, minimum O2 sat 92%.  08/31/22- Maria Bradford never smoker followed for Acute respiratory failure with hypoxia after COVID 2021, Allergic Rhinitis, Asthmatic Bronchitis, complicated by HTN, CAD, NSTEMI/ stents,  Gr2 Diastolic Dysfunction, CKD2, Hyperlipidemia, Pneumonia d/t COVID-19, Depression/ Anxiety, Allergic Rhinitis,. Obesity.  -Ventolin hfa, Advair hfa or Breztri,  zoloft, claritin,  Body weight today  CTaPE chest 06/12/22 IMPRESSION: 1. No evidence of significant pulmonary embolus. 2. No evidence of active pulmonary disease.  ROS-see HPI   + = positive Constitutional:    weight loss, night sweats, fevers, chills, +fatigue, lassitude. HEENT:    headaches, difficulty swallowing, tooth/dental problems, sore throat,       sneezing, itching, ear ache, +nasal congestion, post nasal drip, snoring CV:    chest  pain, orthopnea, PND, swelling in lower extremities, anasarca,                                  dizziness, palpitations Resp:   shortness of breath with exertion or at rest.                productive cough,   non-productive cough, coughing up of blood.              change in color of mucus.  wheezing.   Skin:    rash or lesions. GI:  No-   heartburn, indigestion, abdominal pain, nausea, vomiting, diarrhea,                 change in bowel habits, loss of appetite GU: dysuria, change in color of urine, no urgency or frequency.   flank pain. MS:   joint pain, stiffness, decreased range of motion, back pain. Neuro-     nothing unusual Psych:  change in mood or affect.  depression or anxiety.   memory loss.  OBJ- Physical Exam General- Alert, Oriented, Affect-appropriate, Distress- none acute, + tall/ big/obese Skin- rash-none, lesions- none, excoriation- none Lymphadenopathy- none Head- atraumatic            Eyes- Gross vision intact, PERRLA, conjunctivae and secretions clear            Ears- Hearing, canals-normal            Nose- + snorting and sniffing no-Septal dev, mucus, polyps, erosion, perforation             Throat- Mallampati II-III , mucosa clear , drainage- none, tonsils+ moderate, + teeth Neck- flexible , trachea midline, no stridor , thyroid nl, carotid no bruit Chest - symmetrical excursion , unlabored  Heart/CV- RRR , no murmur , no gallop  , no rub, nl s1 s2                           - JVD- none , edema- none, stasis changes- none, varices- none           Lung-wheeze- none, cough+active , dullness-none, rub- none           Chest wall-  Abd-  Br/ Gen/ Rectal- Not done, not indicated Extrem- cyanosis- none, clubbing, none, atrophy- none, strength- nl Neuro- grossly intact to observation

## 2022-08-31 ENCOUNTER — Ambulatory Visit: Payer: Medicaid Other | Admitting: Internal Medicine

## 2023-05-16 NOTE — Progress Notes (Signed)
 HPI F never smoker followed for Asthma, Acute respiratory failure with hypoxia after COVID 2021, Allergic Rhinitis, Bronchitis, complicated by  HTN, CAD, NSTEMI/ stents,  Gr2 Diastolic Dysfunction, CKD2, Hyperlipidemia, Pneumonia d/t COVID-19, Depression/ Anxiety, Allergic Rhinitis,  PFT 08/26/19- WNL Walk Test room air 06/01/22-3 laps, maximum HR 109, minimum O2 sat 92%. ====================================================================   06/01/22- 42 yoF never smoker followed for Acute respiratory failure with hypoxia after COVID 2021, Allergic Rhinitis, Asthmatic Bronchitis, complicated by HTN, CAD, NSTEMI/ stents,  Gr2 Diastolic Dysfunction, CKD2, Hyperlipidemia, Pneumonia d/t COVID-19, Depression/ Anxiety, Allergic Rhinitis,.  -Ventolin  hfa, Advair  hfa 115, zoloft , claritin ,  Body weight today ED 05/12/22- Bronchitis> augmentin, Zith, prednisone , tessalon  perles PFT 08/26/19- WNL Reports sleep study/[facility] in September, 2023 was normal. ECHO 2021 negative for PHTN, EF 65-70%.  Pending follow-up with her cardiologist in [facility] in July. Walk Test room air 06/01/22-3 laps, maximum HR 109, minimum O2 sat 92%.  05/17/23-43 yoF never smoker followed for Hx Acute respiratory failure with hypoxia after COVID 2021, Allergic Rhinitis, Asthmatic Bronchitis, complicated by HTN, CAD, NSTEMI/ stents,  Gr2 Diastolic Dysfunction, CKD2, Hyperlipidemia, Pneumonia d/t COVID-19, Depression/ Anxiety, Allergic Rhinitis,.  -Ventolin  hfa, Advair  hfa 115 or Breztri , zoloft , claritin ,  Body weight today-262 lbs Discussed the use of AI scribe software for clinical note transcription with the patient, who gave verbal consent to proceed. Today she says wheeze almost gone and no cough or phlegm. History of Present Illness   Maria Bradford is a 43 year old female with asthma and COPD who presents with shortness of breath and wheezing.  She experiences intermittent shortness of breath during activities such as  walking and working, with no specific pattern or association with exertion. Wheezing occurs daily, particularly during work, and she uses an albuterol  rescue inhaler at least once daily. She has been using Breztri  twice daily but has run out of it. She previously used Advair  but found Breztri  more effective. No sputum production is present. Symptoms worsen during pollen season. 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     Assessment and Plan:    Asthma Intermittent wheezing and dyspnea during exertion. Symptoms exacerbated by pollen season. Uses albuterol  daily at work. - Refill Breztri  inhaler for maintenance therapy. - Advise Breztri  use in the morning and evening with mouth rinsing post-use. - Ensure availability of albuterol  rescue inhaler for daily work use.  Chronic Obstructive Pulmonary Disease (COPD) COPD managed with Breztri , preferred for symptom control. - Refill Breztri  inhaler for COPD management.  Obesity Weight is 262 pounds. Motivated to lose weight, actively monitoring activity, walking five miles daily. - Encourage continued physical activity and weight loss efforts.     CTaPE 06/12/22 IMPRESSION: 1. No evidence of significant pulmonary embolus. 2. No evidence of active pulmonary disease.   ROS-see HPI   + = positive Constitutional:    weight loss, night sweats, fevers, chills, +fatigue, lassitude. HEENT:    headaches, difficulty swallowing, tooth/dental problems, sore throat,       sneezing, itching, ear ache, +nasal congestion, post nasal drip, snoring CV:    chest pain, orthopnea, PND, swelling in lower extremities, anasarca,                                   dizziness, palpitations Resp:   shortness of breath with exertion or at rest.  productive cough,   non-productive cough, coughing up of blood.              change in color of mucus.  wheezing.   Skin:    rash or lesions. GI:  No-   heartburn, indigestion, abdominal pain, nausea, vomiting,  diarrhea,                 change in bowel habits, loss of appetite GU: dysuria, change in color of urine, no urgency or frequency.   flank pain. MS:   joint pain, stiffness, decreased range of motion, back pain. Neuro-     nothing unusual Psych:  change in mood or affect.  depression or anxiety.   memory loss.  OBJ- Physical Exam General- Alert, Oriented, Affect-appropriate, Distress- none acute, + tall/ big/obese Skin- rash-none, lesions- none, excoriation- none Lymphadenopathy- none Head- atraumatic            Eyes- Gross vision intact, PERRLA, conjunctivae and secretions clear            Ears- Hearing, canals-normal            Nose- + snorting and sniffing no-Septal dev, mucus, polyps, erosion, perforation             Throat- Mallampati II-III , mucosa clear , drainage- none, tonsils+ moderate, + teeth Neck- flexible , trachea midline, no stridor , thyroid nl, carotid no bruit Chest - symmetrical excursion , unlabored           Heart/CV- RRR , no murmur , no gallop  , no rub, nl s1 s2                           - JVD- none , edema- none, stasis changes- none, varices- none           Lung-wheeze- none, cough+active , dullness-none, rub- none           Chest wall-  Abd-  Br/ Gen/ Rectal- Not done, not indicated Extrem- cyanosis- none, clubbing, none, atrophy- none, strength- nl Neuro- grossly intact to observation

## 2023-05-17 ENCOUNTER — Ambulatory Visit (INDEPENDENT_AMBULATORY_CARE_PROVIDER_SITE_OTHER): Admitting: Internal Medicine

## 2023-05-17 ENCOUNTER — Encounter: Payer: Self-pay | Admitting: Internal Medicine

## 2023-05-17 VITALS — BP 112/80 | HR 91 | Temp 98.1°F | Ht 66.5 in | Wt 262.2 lb

## 2023-05-17 DIAGNOSIS — E669 Obesity, unspecified: Secondary | ICD-10-CM

## 2023-05-17 DIAGNOSIS — J449 Chronic obstructive pulmonary disease, unspecified: Secondary | ICD-10-CM

## 2023-05-17 DIAGNOSIS — J4531 Mild persistent asthma with (acute) exacerbation: Secondary | ICD-10-CM | POA: Diagnosis not present

## 2023-05-17 DIAGNOSIS — Z6841 Body Mass Index (BMI) 40.0 and over, adult: Secondary | ICD-10-CM | POA: Diagnosis not present

## 2023-05-17 MED ORDER — ALBUTEROL SULFATE HFA 108 (90 BASE) MCG/ACT IN AERS: INHALATION_SPRAY | RESPIRATORY_TRACT | 1 refills | Status: AC

## 2023-05-17 MED ORDER — BREZTRI AEROSPHERE 160-9-4.8 MCG/ACT IN AERO: INHALATION_SPRAY | RESPIRATORY_TRACT | 5 refills | Status: AC

## 2023-05-17 NOTE — Patient Instructions (Signed)
 Refills sent for  Breztri  maintenance inhaler and for albuterol  rescue inhaler  Please call if we can help

## 2023-05-18 ENCOUNTER — Telehealth: Payer: Self-pay

## 2023-05-18 NOTE — Telephone Encounter (Signed)
*  Pulm  Pharmacy Patient Advocate Encounter   Received notification from CoverMyMeds that prior authorization for Breztri  Aerosphere 160-9-4.8MCG/ACT aerosol  is required/requested.   Insurance verification completed.   The patient is insured through [facility] .   Per test claim: PA required; PA submitted to above mentioned insurance via CoverMyMeds Key/confirmation #/EOC [facility] Status is pending

## 2023-05-18 NOTE — Telephone Encounter (Signed)
 Approved. BREZTRI  AEROSPHERE 160/9/4.8/MCG/ACT Aerosol is approved from 05/18/2023 to 05/17/2024. Effective Date: 05/18/2023 Authorization Expiration Date: 05/17/2024

## 2023-08-11 ENCOUNTER — Emergency Department (HOSPITAL_BASED_OUTPATIENT_CLINIC_OR_DEPARTMENT_OTHER)
Admission: EM | Admit: 2023-08-11 | Discharge: 2023-08-12 | Disposition: A | Discharge status: Home / Self Care | Attending: Emergency Medicine | Admitting: Emergency Medicine

## 2023-08-11 ENCOUNTER — Other Ambulatory Visit: Payer: Self-pay

## 2023-08-11 ENCOUNTER — Encounter (HOSPITAL_BASED_OUTPATIENT_CLINIC_OR_DEPARTMENT_OTHER): Payer: Self-pay | Admitting: Emergency Medicine

## 2023-08-11 DIAGNOSIS — E1165 Type 2 diabetes mellitus with hyperglycemia: Secondary | ICD-10-CM | POA: Diagnosis not present

## 2023-08-11 DIAGNOSIS — J449 Chronic obstructive pulmonary disease, unspecified: Secondary | ICD-10-CM | POA: Diagnosis not present

## 2023-08-11 DIAGNOSIS — R350 Frequency of micturition: Secondary | ICD-10-CM | POA: Diagnosis present

## 2023-08-11 DIAGNOSIS — I10 Essential (primary) hypertension: Secondary | ICD-10-CM | POA: Insufficient documentation

## 2023-08-11 DIAGNOSIS — Z7951 Long term (current) use of inhaled steroids: Secondary | ICD-10-CM | POA: Diagnosis not present

## 2023-08-11 DIAGNOSIS — Z79899 Other long term (current) drug therapy: Secondary | ICD-10-CM | POA: Insufficient documentation

## 2023-08-11 DIAGNOSIS — R739 Hyperglycemia, unspecified: Secondary | ICD-10-CM

## 2023-08-11 DIAGNOSIS — Z7982 Long term (current) use of aspirin: Secondary | ICD-10-CM | POA: Diagnosis not present

## 2023-08-11 DIAGNOSIS — I2510 Atherosclerotic heart disease of native coronary artery without angina pectoris: Secondary | ICD-10-CM | POA: Insufficient documentation

## 2023-08-11 LAB — I-STAT VENOUS BLOOD GAS, ED
Acid-base deficit: 3 mmol/L — ABNORMAL HIGH (ref 0.0–2.0)
Bicarbonate: 21.5 mmol/L (ref 20.0–28.0)
Calcium, Ion: 1.17 mmol/L (ref 1.15–1.40)
HCT: 46 % (ref 36.0–46.0)
Hemoglobin: 15.6 g/dL — ABNORMAL HIGH (ref 12.0–15.0)
O2 Saturation: 61 %
Patient temperature: 98.7
Potassium: 4.4 mmol/L (ref 3.5–5.1)
Sodium: 128 mmol/L — ABNORMAL LOW (ref 135–145)
TCO2: 23 mmol/L (ref 22–32)
pCO2, Ven: 35 mmHg — ABNORMAL LOW (ref 44–60)
pH, Ven: 7.396 (ref 7.25–7.43)
pO2, Ven: 31 mmHg — CL (ref 32–45)

## 2023-08-11 LAB — CBC WITH DIFFERENTIAL/PLATELET
Abs Immature Granulocytes: 0.02 K/uL (ref 0.00–0.07)
Basophils Absolute: 0 K/uL (ref 0.0–0.1)
Basophils Relative: 0 %
Eosinophils Absolute: 0.1 K/uL (ref 0.0–0.5)
Eosinophils Relative: 1 %
HCT: 41.8 % (ref 36.0–46.0)
Hemoglobin: 14.5 g/dL (ref 12.0–15.0)
Immature Granulocytes: 0 %
Lymphocytes Relative: 43 %
Lymphs Abs: 4.4 K/uL — ABNORMAL HIGH (ref 0.7–4.0)
MCH: 29.2 pg (ref 26.0–34.0)
MCHC: 34.7 g/dL (ref 30.0–36.0)
MCV: 84.1 fL (ref 80.0–100.0)
Monocytes Absolute: 0.6 K/uL (ref 0.1–1.0)
Monocytes Relative: 6 %
Neutro Abs: 5.1 K/uL (ref 1.7–7.7)
Neutrophils Relative %: 50 %
Platelets: 299 K/uL (ref 150–400)
RBC: 4.97 MIL/uL (ref 3.87–5.11)
RDW: 12.9 % (ref 11.5–15.5)
WBC: 10.2 K/uL (ref 4.0–10.5)
nRBC: 0 % (ref 0.0–0.2)

## 2023-08-11 LAB — CBG MONITORING, ED: Glucose-Capillary: >600 mg/dL (ref 70–99)

## 2023-08-11 MED ORDER — SODIUM CHLORIDE 0.9 % IV BOLUS
1000.0000 mL | Freq: Once | INTRAVENOUS | Status: AC
  Administered 2023-08-11: 1000 mL via INTRAVENOUS

## 2023-08-11 MED ORDER — INSULIN ASPART 100 UNIT/ML IV SOLN
10.0000 [IU] | Freq: Once | INTRAVENOUS | Status: AC
  Administered 2023-08-12: 10 [IU] via INTRAVENOUS

## 2023-08-11 NOTE — ED Triage Notes (Signed)
 Blurred vision, fatigue, increased thirst and urination. Pt states last year I was borderline diabetic.

## 2023-08-11 NOTE — ED Provider Notes (Signed)
 [facility] Provider Note   CSN: [id number] Arrival date & time: 08/11/23  2236     Patient presents with: Hyperglycemia   Maria Bradford is a 43 y.o. female.  {Add pertinent medical, surgical, social history, OB history to HPI:32947} Patient is a 43 year old female with past medical history of coronary artery disease with prior MI and stent, hypertension, COPD, hyperlipidemia, and borderline diabetes.  Patient presenting today with complaints of dizziness, weakness, increased thirst, and increased urination.  Symptoms have been worsening over the past several days.  She took her blood sugar at home and the reading was high.  She denies any dietary indiscretion.  No fevers or chills.  No chest pain or difficulty breathing.       Prior to Admission medications   Medication Sig Start Date End Date Taking? Authorizing Provider  acetaminophen  (TYLENOL ) 500 MG tablet Take 1,000 mg by mouth every 6 (six) hours as needed for fever or headache (pain). For pain    [provider]  albuterol  (VENTOLIN  HFA) 108 (90 Base) MCG/ACT inhaler Inhale 2 puffs every 6 hours as needed, rescue inhaler 05/17/23   Neysa Rama D, MD  amLODipine (NORVASC) 10 MG tablet Take 10 mg by mouth daily.    [provider]  aspirin  EC 81 MG tablet Take 81 mg by mouth daily.    [provider]  atorvastatin  (LIPITOR ) 80 MG tablet Take 1 tablet (80 mg total) by mouth daily. 05/31/19   Bryn Bernardino NOVAK, MD  budeson-glycopyrrolate-formoterol  (BREZTRI  AEROSPHERE) 160-9-4.8 MCG/ACT AERO inhaler Inhale 2 puffs then rinse mouth, twice daily 05/17/23   Neysa Rama D, MD  busPIRone (BUSPAR) 5 MG tablet Take 5 mg by mouth 3 (three) times daily.    [provider]  carvedilol  (COREG ) 6.25 MG tablet Take 1 tablet (6.25 mg total) by mouth 2 (two) times daily. 05/12/19 06/11/19  Raenelle Donalda HERO, MD  etonogestrel (NEXPLANON) 68 MG IMPL implant 1 each  by Subdermal route continuous. Implanted 2020    [provider]  famotidine  (PEPCID ) 20 MG tablet Take 1 tablet (20 mg total) by mouth daily. 06/30/19   Hope Almarie ORN, NP  Multiple Vitamins-Minerals (EMERGEN-C IMMUNE PLUS/VIT D) CHEW Chew 1 tablet by mouth 2 (two) times daily.    [provider]  sertraline  (ZOLOFT ) 50 MG tablet Take 50 mg by mouth daily. 04/28/19   [provider]  Vitamin D , Ergocalciferol , (DRISDOL) 1.25 MG (50000 UNIT) CAPS capsule Take 50,000 Units by mouth once a week.    [provider]    Allergies: Oxycodone -acetaminophen  and Oxycodone     Review of Systems  All other systems reviewed and are negative.   Updated Vital Signs BP (!) 163/130 (BP Location: Right Arm)   Pulse (!) 116   Temp 98.5 F (36.9 C)   Resp 20   Ht 5' 6.5 (1.689 m)   Wt 122 kg   SpO2 98%   BMI 42.77 kg/m   Physical Exam Vitals and nursing note reviewed.  Constitutional:      General: She is not in acute distress.    Appearance: She is well-developed. She is not diaphoretic.  HENT:     Head: Normocephalic and atraumatic.  Cardiovascular:     Rate and Rhythm: Normal rate and regular rhythm.     Heart sounds: No murmur heard.    No friction rub. No gallop.  Pulmonary:     Effort: Pulmonary effort is normal. No  respiratory distress.     Breath sounds: Normal breath sounds. No wheezing.  Abdominal:     General: Bowel sounds are normal. There is no distension.     Palpations: Abdomen is soft.     Tenderness: There is no abdominal tenderness.  Musculoskeletal:        General: Normal range of motion.     Cervical back: Normal range of motion and neck supple.  Skin:    General: Skin is warm and dry.  Neurological:     General: No focal deficit present.     Mental Status: She is alert and oriented to person, place, and time.     (all labs ordered are listed, but only abnormal results are displayed) Labs Reviewed  I-STAT VENOUS BLOOD GAS,  ED - Abnormal; Notable for the following components:      Result Value   pCO2, Ven 35.0 (*)    pO2, Ven 31 (*)    Acid-base deficit 3.0 (*)    Sodium 128 (*)    Hemoglobin 15.6 (*)    All other components within normal limits  CBG MONITORING, ED - Abnormal; Notable for the following components:   Glucose-Capillary >600 (*)    All other components within normal limits  PREGNANCY, URINE  CBC WITH DIFFERENTIAL/PLATELET  URINALYSIS, ROUTINE W REFLEX MICROSCOPIC  BASIC METABOLIC PANEL WITH GFR  BETA-HYDROXYBUTYRIC ACID    EKG: None  Radiology: No results found.  {Document cardiac monitor, telemetry assessment procedure when appropriate:32947} Procedures   Medications Ordered in the ED  sodium chloride  0.9 % bolus 1,000 mL (has no administration in time range)  insulin  aspart (novoLOG ) injection 10 Units (has no administration in time range)      {Click here for ABCD2, HEART and other calculators REFRESH Note before signing:1}                              Medical Decision Making Risk OTC drugs.   ***  {Document critical care time when appropriate  Document review of labs and clinical decision tools ie CHADS2VASC2, etc  Document your independent review of radiology images and any outside records  Document your discussion with family members, caretakers and with consultants  Document social determinants of health affecting pt's care  Document your decision making why or why not admission, treatments were needed:32947:::1}   Final diagnoses:  None    ED Discharge Orders     None

## 2023-08-12 LAB — URINALYSIS, ROUTINE W REFLEX MICROSCOPIC
Bilirubin Urine: NEGATIVE
Glucose, UA: >=500 mg/dL — AB
Ketones, ur: NEGATIVE mg/dL
Leukocytes,Ua: NEGATIVE
Nitrite: NEGATIVE
Protein, ur: 30 mg/dL — AB
Specific Gravity, Urine: 1.01 (ref 1.005–1.030)
pH: 7 (ref 5.0–8.0)

## 2023-08-12 LAB — CBG MONITORING, ED
Glucose-Capillary: 295 mg/dL — ABNORMAL HIGH (ref 70–99)
Glucose-Capillary: 371 mg/dL — ABNORMAL HIGH (ref 70–99)
Glucose-Capillary: 445 mg/dL — ABNORMAL HIGH (ref 70–99)
Glucose-Capillary: 581 mg/dL (ref 70–99)

## 2023-08-12 LAB — URINALYSIS, MICROSCOPIC (REFLEX)

## 2023-08-12 LAB — BASIC METABOLIC PANEL WITH GFR
Anion gap: 14 (ref 5–15)
BUN: 13 mg/dL (ref 6–20)
CO2: 22 mmol/L (ref 22–32)
Calcium: 10 mg/dL (ref 8.9–10.3)
Chloride: 92 mmol/L — ABNORMAL LOW (ref 98–111)
Creatinine, Ser: 1.55 mg/dL — ABNORMAL HIGH (ref 0.44–1.00)
GFR, Estimated: 42 mL/min — ABNORMAL LOW (ref >60)
Glucose, Bld: 805 mg/dL (ref 70–99)
Potassium: 4.5 mmol/L (ref 3.5–5.1)
Sodium: 127 mmol/L — ABNORMAL LOW (ref 135–145)

## 2023-08-12 LAB — PREGNANCY, URINE: Preg Test, Ur: NEGATIVE

## 2023-08-12 LAB — BETA-HYDROXYBUTYRIC ACID: Beta-Hydroxybutyric Acid: 0.1 mmol/L (ref 0.05–0.27)

## 2023-08-12 MED ORDER — METFORMIN HCL 500 MG PO TABS: 500.0000 mg | ORAL_TABLET | Freq: Two times a day (BID) | ORAL | 0 refills | Status: AC

## 2023-08-12 MED ORDER — INSULIN ASPART 100 UNIT/ML IV SOLN
5.0000 [IU] | Freq: Once | INTRAVENOUS | Status: AC
  Administered 2023-08-12: 5 [IU] via INTRAVENOUS

## 2023-08-12 MED ORDER — SODIUM CHLORIDE 0.9 % IV BOLUS
500.0000 mL | Freq: Once | INTRAVENOUS | Status: AC
  Administered 2023-08-12: 500 mL via INTRAVENOUS

## 2023-08-12 MED ORDER — LIVING WELL WITH DIABETES BOOK
Freq: Once | Status: AC
  Filled 2023-08-12: qty 1

## 2023-08-12 MED ORDER — INSULIN ASPART 100 UNIT/ML IV SOLN
6.0000 [IU] | Freq: Once | INTRAVENOUS | Status: AC
  Administered 2023-08-12: 6 [IU] via INTRAVENOUS

## 2023-08-12 MED ORDER — SODIUM CHLORIDE 0.9 % IV BOLUS
1000.0000 mL | Freq: Once | INTRAVENOUS | Status: AC
  Administered 2023-08-12: 1000 mL via INTRAVENOUS

## 2023-08-12 NOTE — ED Notes (Signed)
 Critical glucose 805. EDP and primary RN notified.

## 2023-08-12 NOTE — ED Notes (Signed)
 Pt taken to room on steady, nearly fell over when getting up from Triage chair. Fall bracelet placed, nonskid socks given and High Fall Risk sign on door. Pt instructed to call for help before attempting to get up alone. Pt and visitor verbalized understanding.

## 2023-08-12 NOTE — Discharge Instructions (Signed)
 Begin taking metformin  as prescribed.  Adhere to a low sugar/carbohydrate diet.  Follow-up with primary doctor tomorrow as scheduled.
